# Patient Record
Sex: Female | Born: 1965
Health system: Southern US, Community
[De-identification: ages and names within clinical notes are randomized; demographics above are authoritative.]

## PROBLEM LIST (undated history)

## (undated) ENCOUNTER — Emergency Department (HOSPITAL_BASED_OUTPATIENT_CLINIC_OR_DEPARTMENT_OTHER): Admission: EM | Payer: BC Managed Care – PPO

## (undated) DIAGNOSIS — M5030 Other cervical disc degeneration, unspecified cervical region: Secondary | ICD-10-CM

## (undated) DIAGNOSIS — K219 Gastro-esophageal reflux disease without esophagitis: Secondary | ICD-10-CM

## (undated) DIAGNOSIS — E782 Mixed hyperlipidemia: Secondary | ICD-10-CM

## (undated) DIAGNOSIS — G43909 Migraine, unspecified, not intractable, without status migrainosus: Secondary | ICD-10-CM

## (undated) DIAGNOSIS — F419 Anxiety disorder, unspecified: Secondary | ICD-10-CM

## (undated) DIAGNOSIS — K449 Diaphragmatic hernia without obstruction or gangrene: Secondary | ICD-10-CM

## (undated) DIAGNOSIS — N183 Chronic kidney disease, stage 3 (moderate): Secondary | ICD-10-CM

## (undated) DIAGNOSIS — N289 Disorder of kidney and ureter, unspecified: Secondary | ICD-10-CM

## (undated) DIAGNOSIS — K589 Irritable bowel syndrome without diarrhea: Secondary | ICD-10-CM

## (undated) DIAGNOSIS — I10 Essential (primary) hypertension: Secondary | ICD-10-CM

## (undated) DIAGNOSIS — K2270 Barrett's esophagus without dysplasia: Secondary | ICD-10-CM

## (undated) DIAGNOSIS — R5383 Other fatigue: Secondary | ICD-10-CM

## (undated) DIAGNOSIS — Z79899 Other long term (current) drug therapy: Secondary | ICD-10-CM

## (undated) HISTORY — DX: Gastro-esophageal reflux disease without esophagitis: K21.9

## (undated) HISTORY — DX: Mixed hyperlipidemia: E78.2

## (undated) HISTORY — PX: GASTRECTOMY: SHX58

## (undated) HISTORY — PX: SHOULDER SURGERY: SHX246

## (undated) HISTORY — DX: Chronic kidney disease, stage 3 (moderate): N18.3

## (undated) HISTORY — DX: Diaphragmatic hernia without obstruction or gangrene: K44.9

## (undated) HISTORY — DX: Other cervical disc degeneration, unspecified cervical region: M50.30

## (undated) HISTORY — PX: ESOPHAGOGASTRIC FUNDOPLASTY: SUR458

## (undated) HISTORY — DX: Barrett's esophagus without dysplasia: K22.70

## (undated) HISTORY — PX: HIP SURGERY: SHX245

## (undated) HISTORY — DX: Other fatigue: R53.83

## (undated) HISTORY — DX: Other long term (current) drug therapy: Z79.899

## (undated) HISTORY — PX: ABDOMINAL HYSTERECTOMY: SHX81

---

## 2007-06-11 ENCOUNTER — Encounter: Payer: Self-pay | Admitting: Internal Medicine

## 2007-06-20 ENCOUNTER — Encounter: Payer: Self-pay | Admitting: Internal Medicine

## 2007-09-14 ENCOUNTER — Encounter: Payer: Self-pay | Admitting: Internal Medicine

## 2007-09-16 ENCOUNTER — Emergency Department (HOSPITAL_COMMUNITY): Admission: EM | Admit: 2007-09-16 | Discharge: 2007-09-16 | Payer: Self-pay | Admitting: Emergency Medicine

## 2007-09-17 ENCOUNTER — Encounter: Payer: Self-pay | Admitting: Internal Medicine

## 2008-11-11 ENCOUNTER — Encounter: Payer: Self-pay | Admitting: Internal Medicine

## 2008-11-17 ENCOUNTER — Emergency Department (HOSPITAL_BASED_OUTPATIENT_CLINIC_OR_DEPARTMENT_OTHER): Admission: EM | Admit: 2008-11-17 | Discharge: 2008-11-17 | Payer: Self-pay | Admitting: Emergency Medicine

## 2008-11-17 LAB — CONVERTED CEMR LAB
BUN: 9 mg/dL
CO2: 28 meq/L
Chloride: 102 meq/L
Creatinine, Ser: 0.8 mg/dL
HCT: 41 %
Hemoglobin: 14 g/dL
Platelets: 172 10*3/uL
Potassium: 3.9 meq/L
Sodium: 139 meq/L
WBC: 4.5 10*3/uL

## 2008-11-18 ENCOUNTER — Ambulatory Visit: Payer: Self-pay | Admitting: Internal Medicine

## 2008-11-18 DIAGNOSIS — I10 Essential (primary) hypertension: Secondary | ICD-10-CM | POA: Insufficient documentation

## 2008-11-18 DIAGNOSIS — F411 Generalized anxiety disorder: Secondary | ICD-10-CM | POA: Insufficient documentation

## 2008-11-18 DIAGNOSIS — R0602 Shortness of breath: Secondary | ICD-10-CM | POA: Insufficient documentation

## 2008-11-18 DIAGNOSIS — F17200 Nicotine dependence, unspecified, uncomplicated: Secondary | ICD-10-CM

## 2008-11-18 LAB — CONVERTED CEMR LAB
Free T4: 0.7 ng/dL (ref 0.6–1.6)
Pro B Natriuretic peptide (BNP): 12 pg/mL (ref 0.0–100.0)
TSH: 2.39 microintl units/mL (ref 0.35–5.50)

## 2008-11-19 ENCOUNTER — Telehealth (INDEPENDENT_AMBULATORY_CARE_PROVIDER_SITE_OTHER): Payer: Self-pay | Admitting: *Deleted

## 2008-11-26 ENCOUNTER — Telehealth: Payer: Self-pay | Admitting: Internal Medicine

## 2008-12-01 ENCOUNTER — Ambulatory Visit: Payer: Self-pay

## 2008-12-04 ENCOUNTER — Telehealth: Payer: Self-pay | Admitting: Internal Medicine

## 2008-12-10 ENCOUNTER — Telehealth: Payer: Self-pay | Admitting: Internal Medicine

## 2008-12-10 ENCOUNTER — Ambulatory Visit: Payer: Self-pay | Admitting: Internal Medicine

## 2008-12-10 LAB — CONVERTED CEMR LAB
ALT: 15 units/L (ref 0–35)
AST: 21 units/L (ref 0–37)
Albumin: 3.8 g/dL (ref 3.5–5.2)
Alkaline Phosphatase: 34 units/L — ABNORMAL LOW (ref 39–117)
Bilirubin, Direct: 0.1 mg/dL (ref 0.0–0.3)
CRP, High Sensitivity: 1 (ref 0.00–5.00)
Cholesterol: 150 mg/dL (ref 0–200)
HDL: 40.7 mg/dL (ref >39.0)
LDL Cholesterol: 97 mg/dL (ref 0–99)
Total Bilirubin: 0.6 mg/dL (ref 0.3–1.2)
Total CHOL/HDL Ratio: 3.7
Total Protein: 6.8 g/dL (ref 6.0–8.3)
Triglycerides: 62 mg/dL (ref 0–149)
VLDL: 12 mg/dL (ref 0–40)

## 2008-12-13 ENCOUNTER — Emergency Department (HOSPITAL_BASED_OUTPATIENT_CLINIC_OR_DEPARTMENT_OTHER): Admission: EM | Admit: 2008-12-13 | Discharge: 2008-12-13 | Payer: Self-pay | Admitting: Emergency Medicine

## 2008-12-15 ENCOUNTER — Ambulatory Visit: Payer: Self-pay | Admitting: Internal Medicine

## 2008-12-15 DIAGNOSIS — K229 Disease of esophagus, unspecified: Secondary | ICD-10-CM | POA: Insufficient documentation

## 2009-01-06 ENCOUNTER — Ambulatory Visit: Payer: Self-pay | Admitting: Internal Medicine

## 2009-01-06 DIAGNOSIS — J309 Allergic rhinitis, unspecified: Secondary | ICD-10-CM | POA: Insufficient documentation

## 2009-01-06 DIAGNOSIS — R197 Diarrhea, unspecified: Secondary | ICD-10-CM

## 2009-01-07 ENCOUNTER — Emergency Department (HOSPITAL_BASED_OUTPATIENT_CLINIC_OR_DEPARTMENT_OTHER): Admission: EM | Admit: 2009-01-07 | Discharge: 2009-01-07 | Payer: Self-pay | Admitting: Emergency Medicine

## 2009-01-09 ENCOUNTER — Ambulatory Visit: Payer: Self-pay | Admitting: Internal Medicine

## 2009-01-09 ENCOUNTER — Emergency Department (HOSPITAL_BASED_OUTPATIENT_CLINIC_OR_DEPARTMENT_OTHER): Admission: EM | Admit: 2009-01-09 | Discharge: 2009-01-10 | Payer: Self-pay | Admitting: Emergency Medicine

## 2009-01-09 ENCOUNTER — Ambulatory Visit: Payer: Self-pay | Admitting: Diagnostic Radiology

## 2009-01-09 DIAGNOSIS — K219 Gastro-esophageal reflux disease without esophagitis: Secondary | ICD-10-CM

## 2009-01-26 ENCOUNTER — Ambulatory Visit: Payer: Self-pay | Admitting: Internal Medicine

## 2009-01-26 DIAGNOSIS — R519 Headache, unspecified: Secondary | ICD-10-CM | POA: Insufficient documentation

## 2009-01-26 DIAGNOSIS — R51 Headache: Secondary | ICD-10-CM

## 2009-01-27 ENCOUNTER — Encounter: Payer: Self-pay | Admitting: Internal Medicine

## 2009-01-27 LAB — CONVERTED CEMR LAB
BUN: 9 mg/dL (ref 6–23)
CO2: 25 meq/L (ref 19–32)
Calcium: 10 mg/dL (ref 8.4–10.5)
Chloride: 103 meq/L (ref 96–112)
Creatinine, Ser: 0.81 mg/dL (ref 0.40–1.20)
Glucose, Bld: 67 mg/dL — ABNORMAL LOW (ref 70–99)
Potassium: 3.8 meq/L (ref 3.5–5.3)
Sodium: 138 meq/L (ref 135–145)

## 2009-01-28 ENCOUNTER — Telehealth: Payer: Self-pay | Admitting: Internal Medicine

## 2009-03-10 ENCOUNTER — Encounter: Payer: Self-pay | Admitting: Internal Medicine

## 2009-05-27 ENCOUNTER — Emergency Department (HOSPITAL_BASED_OUTPATIENT_CLINIC_OR_DEPARTMENT_OTHER): Admission: EM | Admit: 2009-05-27 | Discharge: 2009-05-27 | Payer: Self-pay | Admitting: Emergency Medicine

## 2009-05-27 ENCOUNTER — Ambulatory Visit: Payer: Self-pay | Admitting: Diagnostic Radiology

## 2009-08-04 ENCOUNTER — Encounter: Payer: Self-pay | Admitting: Internal Medicine

## 2009-08-13 ENCOUNTER — Encounter: Payer: Self-pay | Admitting: Internal Medicine

## 2009-09-10 ENCOUNTER — Emergency Department (HOSPITAL_BASED_OUTPATIENT_CLINIC_OR_DEPARTMENT_OTHER): Admission: EM | Admit: 2009-09-10 | Discharge: 2009-09-10 | Payer: Self-pay | Admitting: Emergency Medicine

## 2009-11-09 ENCOUNTER — Observation Stay (HOSPITAL_COMMUNITY): Admission: EM | Admit: 2009-11-09 | Discharge: 2009-11-10 | Payer: Self-pay | Admitting: Internal Medicine

## 2009-11-11 ENCOUNTER — Ambulatory Visit: Payer: Self-pay | Admitting: Diagnostic Radiology

## 2009-11-11 ENCOUNTER — Emergency Department (HOSPITAL_BASED_OUTPATIENT_CLINIC_OR_DEPARTMENT_OTHER): Admission: EM | Admit: 2009-11-11 | Discharge: 2009-11-11 | Payer: Self-pay | Admitting: Emergency Medicine

## 2010-07-27 ENCOUNTER — Ambulatory Visit (HOSPITAL_BASED_OUTPATIENT_CLINIC_OR_DEPARTMENT_OTHER): Admission: RE | Admit: 2010-07-27 | Discharge: 2010-07-27 | Payer: Self-pay | Admitting: Internal Medicine

## 2010-07-27 ENCOUNTER — Ambulatory Visit: Payer: Self-pay | Admitting: Diagnostic Radiology

## 2010-08-15 ENCOUNTER — Emergency Department (HOSPITAL_BASED_OUTPATIENT_CLINIC_OR_DEPARTMENT_OTHER): Admission: EM | Admit: 2010-08-15 | Discharge: 2010-08-15 | Payer: Self-pay | Admitting: Emergency Medicine

## 2010-11-07 ENCOUNTER — Encounter: Payer: Self-pay | Admitting: Internal Medicine

## 2010-12-04 ENCOUNTER — Emergency Department (INDEPENDENT_AMBULATORY_CARE_PROVIDER_SITE_OTHER): Payer: Managed Care, Other (non HMO)

## 2010-12-04 ENCOUNTER — Emergency Department (HOSPITAL_BASED_OUTPATIENT_CLINIC_OR_DEPARTMENT_OTHER)
Admission: EM | Admit: 2010-12-04 | Discharge: 2010-12-05 | Disposition: A | Discharge status: Home / Self Care | Payer: Managed Care, Other (non HMO) | Attending: Emergency Medicine | Admitting: Emergency Medicine

## 2010-12-04 DIAGNOSIS — M545 Low back pain: Secondary | ICD-10-CM

## 2010-12-04 DIAGNOSIS — R110 Nausea: Secondary | ICD-10-CM

## 2010-12-04 DIAGNOSIS — R1013 Epigastric pain: Secondary | ICD-10-CM | POA: Insufficient documentation

## 2010-12-04 DIAGNOSIS — M549 Dorsalgia, unspecified: Secondary | ICD-10-CM | POA: Insufficient documentation

## 2010-12-04 DIAGNOSIS — K219 Gastro-esophageal reflux disease without esophagitis: Secondary | ICD-10-CM | POA: Insufficient documentation

## 2010-12-04 DIAGNOSIS — I10 Essential (primary) hypertension: Secondary | ICD-10-CM | POA: Insufficient documentation

## 2010-12-04 DIAGNOSIS — K220 Achalasia of cardia: Secondary | ICD-10-CM | POA: Insufficient documentation

## 2010-12-04 DIAGNOSIS — F17200 Nicotine dependence, unspecified, uncomplicated: Secondary | ICD-10-CM | POA: Insufficient documentation

## 2010-12-04 LAB — LIPASE, BLOOD: Lipase: 166 U/L (ref 23–300)

## 2010-12-04 LAB — DIFFERENTIAL
Basophils Absolute: 0 10*3/uL (ref 0.0–0.1)
Basophils Relative: 0 % (ref 0–1)
Eosinophils Relative: 1 % (ref 0–5)
Lymphocytes Relative: 51 % — ABNORMAL HIGH (ref 12–46)
Monocytes Relative: 6 % (ref 3–12)
Neutro Abs: 2.2 10*3/uL (ref 1.7–7.7)
Neutrophils Relative %: 42 % — ABNORMAL LOW (ref 43–77)

## 2010-12-04 LAB — CBC
Hemoglobin: 13.5 g/dL (ref 12.0–15.0)
MCH: 30.5 pg (ref 26.0–34.0)
MCHC: 34.5 g/dL (ref 30.0–36.0)
MCV: 88.5 fL (ref 78.0–100.0)
Platelets: 179 10*3/uL (ref 150–400)
RBC: 4.42 MIL/uL (ref 3.87–5.11)
RDW: 12.2 % (ref 11.5–15.5)

## 2010-12-04 LAB — URINE MICROSCOPIC-ADD ON

## 2010-12-04 LAB — COMPREHENSIVE METABOLIC PANEL
ALT: 19 U/L (ref 0–35)
AST: 22 U/L (ref 0–37)
Alkaline Phosphatase: 47 U/L (ref 39–117)
BUN: 11 mg/dL (ref 6–23)
CO2: 23 mEq/L (ref 19–32)
Calcium: 9.8 mg/dL (ref 8.4–10.5)
Chloride: 108 mEq/L (ref 96–112)
Creatinine, Ser: 0.7 mg/dL (ref 0.4–1.2)
GFR calc Af Amer: >60 mL/min (ref >60)
GFR calc non Af Amer: >60 mL/min (ref >60)
Glucose, Bld: 87 mg/dL (ref 70–99)
Potassium: 4 mEq/L (ref 3.5–5.1)
Sodium: 142 mEq/L (ref 135–145)
Total Protein: 8 g/dL (ref 6.0–8.3)

## 2010-12-04 LAB — URINALYSIS, ROUTINE W REFLEX MICROSCOPIC
Bilirubin Urine: NEGATIVE
Leukocytes, UA: NEGATIVE
Specific Gravity, Urine: 1.024 (ref 1.005–1.030)
Urine Glucose, Fasting: NEGATIVE mg/dL
Urobilinogen, UA: 1 mg/dL (ref 0.0–1.0)
pH: 6 (ref 5.0–8.0)

## 2010-12-04 LAB — PREGNANCY, URINE: Preg Test, Ur: NEGATIVE

## 2010-12-04 MED ORDER — IOHEXOL 300 MG/ML  SOLN
100.0000 mL | Freq: Once | INTRAMUSCULAR | Status: AC | PRN
  Administered 2010-12-04: 100 mL via INTRAVENOUS

## 2011-01-03 LAB — CARDIAC PANEL(CRET KIN+CKTOT+MB+TROPI)
CK, MB: 0.6 ng/mL (ref 0.3–4.0)
CK, MB: 0.8 ng/mL (ref 0.3–4.0)
Relative Index: INVALID (ref 0.0–2.5)
Relative Index: INVALID (ref 0.0–2.5)
Total CK: 57 U/L (ref 7–177)
Total CK: 69 U/L (ref 7–177)
Troponin I: 0.01 ng/mL (ref 0.00–0.06)
Troponin I: <0.01 ng/mL (ref 0.00–0.06)

## 2011-01-03 LAB — POCT I-STAT, CHEM 8
BUN: 9 mg/dL (ref 6–23)
Calcium, Ion: 1.28 mmol/L (ref 1.12–1.32)
Chloride: 109 mEq/L (ref 96–112)
Creatinine, Ser: 0.7 mg/dL (ref 0.4–1.2)
Glucose, Bld: 105 mg/dL — ABNORMAL HIGH (ref 70–99)
HCT: 36 % (ref 36.0–46.0)
Hemoglobin: 12.2 g/dL (ref 12.0–15.0)
Potassium: 3.7 mEq/L (ref 3.5–5.1)
Sodium: 138 mEq/L (ref 135–145)
TCO2: 19 mmol/L (ref 0–100)

## 2011-01-03 LAB — LIPID PANEL
Cholesterol: 152 mg/dL (ref 0–200)
HDL: 50 mg/dL (ref >39)
LDL Cholesterol: 83 mg/dL (ref 0–99)
Total CHOL/HDL Ratio: 3 RATIO
Triglycerides: 97 mg/dL (ref <150)
VLDL: 19 mg/dL (ref 0–40)

## 2011-01-03 LAB — CBC
Platelets: 156 10*3/uL (ref 150–400)
RDW: 12.8 % (ref 11.5–15.5)

## 2011-01-03 LAB — PROTIME-INR: INR: 1.13 (ref 0.00–1.49)

## 2011-01-03 LAB — COMPREHENSIVE METABOLIC PANEL
ALT: 15 U/L (ref 0–35)
Albumin: 3.4 g/dL — ABNORMAL LOW (ref 3.5–5.2)
Alkaline Phosphatase: 35 U/L — ABNORMAL LOW (ref 39–117)
BUN: 9 mg/dL (ref 6–23)
Potassium: 3.7 mEq/L (ref 3.5–5.1)
Sodium: 134 mEq/L — ABNORMAL LOW (ref 135–145)
Total Protein: 6.6 g/dL (ref 6.0–8.3)

## 2011-01-19 LAB — CBC
MCV: 91.8 fL (ref 78.0–100.0)
RBC: 4.26 MIL/uL (ref 3.87–5.11)
WBC: 4.7 10*3/uL (ref 4.0–10.5)

## 2011-01-19 LAB — URINALYSIS, ROUTINE W REFLEX MICROSCOPIC
Bilirubin Urine: NEGATIVE
Glucose, UA: NEGATIVE mg/dL
Ketones, ur: NEGATIVE mg/dL
Nitrite: NEGATIVE
Protein, ur: NEGATIVE mg/dL
Urobilinogen, UA: 0.2 mg/dL (ref 0.0–1.0)

## 2011-01-19 LAB — LIPASE, BLOOD: Lipase: 153 U/L (ref 23–300)

## 2011-01-19 LAB — COMPREHENSIVE METABOLIC PANEL
ALT: 18 U/L (ref 0–35)
AST: 26 U/L (ref 0–37)
Alkaline Phosphatase: 54 U/L (ref 39–117)
CO2: 23 mEq/L (ref 19–32)
Chloride: 108 mEq/L (ref 96–112)
Creatinine, Ser: 0.8 mg/dL (ref 0.4–1.2)
GFR calc Af Amer: >60 mL/min (ref >60)
GFR calc non Af Amer: >60 mL/min (ref >60)
Potassium: 4 mEq/L (ref 3.5–5.1)
Total Bilirubin: 0.4 mg/dL (ref 0.3–1.2)

## 2011-01-19 LAB — DIFFERENTIAL
Basophils Absolute: 0.1 10*3/uL (ref 0.0–0.1)
Basophils Relative: 2 % — ABNORMAL HIGH (ref 0–1)
Eosinophils Absolute: 0.1 10*3/uL (ref 0.0–0.7)
Eosinophils Relative: 1 % (ref 0–5)
Lymphocytes Relative: 34 % (ref 12–46)

## 2011-01-20 ENCOUNTER — Emergency Department (INDEPENDENT_AMBULATORY_CARE_PROVIDER_SITE_OTHER): Payer: Managed Care, Other (non HMO)

## 2011-01-20 ENCOUNTER — Emergency Department (HOSPITAL_BASED_OUTPATIENT_CLINIC_OR_DEPARTMENT_OTHER)
Admission: EM | Admit: 2011-01-20 | Discharge: 2011-01-20 | Disposition: A | Discharge status: Home / Self Care | Payer: Managed Care, Other (non HMO) | Attending: Emergency Medicine | Admitting: Emergency Medicine

## 2011-01-20 DIAGNOSIS — R51 Headache: Secondary | ICD-10-CM

## 2011-01-20 DIAGNOSIS — J329 Chronic sinusitis, unspecified: Secondary | ICD-10-CM | POA: Insufficient documentation

## 2011-01-20 DIAGNOSIS — Z79899 Other long term (current) drug therapy: Secondary | ICD-10-CM | POA: Insufficient documentation

## 2011-01-20 DIAGNOSIS — K219 Gastro-esophageal reflux disease without esophagitis: Secondary | ICD-10-CM | POA: Insufficient documentation

## 2011-01-20 DIAGNOSIS — I10 Essential (primary) hypertension: Secondary | ICD-10-CM | POA: Insufficient documentation

## 2011-01-20 DIAGNOSIS — F17200 Nicotine dependence, unspecified, uncomplicated: Secondary | ICD-10-CM | POA: Insufficient documentation

## 2011-01-20 DIAGNOSIS — J3489 Other specified disorders of nose and nasal sinuses: Secondary | ICD-10-CM

## 2011-01-20 LAB — CBC
HCT: 40.3 % (ref 36.0–46.0)
Hemoglobin: 13.8 g/dL (ref 12.0–15.0)
MCHC: 34.2 g/dL (ref 30.0–36.0)
RBC: 4.56 MIL/uL (ref 3.87–5.11)

## 2011-01-20 LAB — DIFFERENTIAL
Basophils Absolute: 0 10*3/uL (ref 0.0–0.1)
Lymphocytes Relative: 13 % (ref 12–46)
Monocytes Absolute: 0.2 10*3/uL (ref 0.1–1.0)
Monocytes Relative: 2 % — ABNORMAL LOW (ref 3–12)
Neutro Abs: 6.5 10*3/uL (ref 1.7–7.7)
Neutrophils Relative %: 85 % — ABNORMAL HIGH (ref 43–77)

## 2011-01-20 LAB — BASIC METABOLIC PANEL
CO2: 24 mEq/L (ref 19–32)
Calcium: 9.9 mg/dL (ref 8.4–10.5)
Chloride: 104 mEq/L (ref 96–112)
GFR calc Af Amer: >60 mL/min (ref >60)
Glucose, Bld: 121 mg/dL — ABNORMAL HIGH (ref 70–99)
Sodium: 145 mEq/L (ref 135–145)

## 2011-01-20 LAB — URINALYSIS, ROUTINE W REFLEX MICROSCOPIC
Bilirubin Urine: NEGATIVE
Hgb urine dipstick: NEGATIVE
Nitrite: NEGATIVE
Specific Gravity, Urine: 1.016 (ref 1.005–1.030)
pH: 6 (ref 5.0–8.0)

## 2011-01-22 LAB — BASIC METABOLIC PANEL
BUN: 16 mg/dL (ref 6–23)
CO2: 27 mEq/L (ref 19–32)
Chloride: 104 mEq/L (ref 96–112)
GFR calc non Af Amer: >60 mL/min (ref >60)
Glucose, Bld: 97 mg/dL (ref 70–99)
Potassium: 4.2 mEq/L (ref 3.5–5.1)
Sodium: 140 mEq/L (ref 135–145)

## 2011-01-22 LAB — CBC
HCT: 37 % (ref 36.0–46.0)
Hemoglobin: 12.8 g/dL (ref 12.0–15.0)
MCHC: 34.5 g/dL (ref 30.0–36.0)
MCV: 92.3 fL (ref 78.0–100.0)
Platelets: 211 10*3/uL (ref 150–400)
RDW: 13 % (ref 11.5–15.5)

## 2011-01-22 LAB — DIFFERENTIAL
Basophils Absolute: 0.1 10*3/uL (ref 0.0–0.1)
Basophils Relative: 2 % — ABNORMAL HIGH (ref 0–1)
Eosinophils Absolute: 0.1 10*3/uL (ref 0.0–0.7)
Eosinophils Relative: 2 % (ref 0–5)
Lymphocytes Relative: 37 % (ref 12–46)
Monocytes Absolute: 0.4 10*3/uL (ref 0.1–1.0)

## 2011-01-22 LAB — URINALYSIS, ROUTINE W REFLEX MICROSCOPIC
Bilirubin Urine: NEGATIVE
Ketones, ur: NEGATIVE mg/dL
Nitrite: NEGATIVE
Protein, ur: NEGATIVE mg/dL
pH: 6.5 (ref 5.0–8.0)

## 2011-01-27 LAB — POCT CARDIAC MARKERS: Troponin i, poc: <0.05 ng/mL (ref 0.00–0.09)

## 2011-01-27 LAB — CBC
Hemoglobin: 13.5 g/dL (ref 12.0–15.0)
MCHC: 33.9 g/dL (ref 30.0–36.0)
Platelets: 188 10*3/uL (ref 150–400)
RDW: 12.4 % (ref 11.5–15.5)

## 2011-01-27 LAB — URINALYSIS, ROUTINE W REFLEX MICROSCOPIC
Bilirubin Urine: NEGATIVE
Hgb urine dipstick: NEGATIVE
Ketones, ur: NEGATIVE mg/dL
Nitrite: NEGATIVE
Urobilinogen, UA: 1 mg/dL (ref 0.0–1.0)
pH: 5.5 (ref 5.0–8.0)

## 2011-01-27 LAB — COMPREHENSIVE METABOLIC PANEL
Albumin: 4.5 g/dL (ref 3.5–5.2)
BUN: 11 mg/dL (ref 6–23)
Calcium: 9.6 mg/dL (ref 8.4–10.5)
Glucose, Bld: 90 mg/dL (ref 70–99)
Sodium: 142 mEq/L (ref 135–145)
Total Protein: 8 g/dL (ref 6.0–8.3)

## 2011-01-27 LAB — DIFFERENTIAL
Lymphs Abs: 1.7 10*3/uL (ref 0.7–4.0)
Monocytes Absolute: 0.3 10*3/uL (ref 0.1–1.0)
Monocytes Relative: 6 % (ref 3–12)
Neutro Abs: 2.1 10*3/uL (ref 1.7–7.7)
Neutrophils Relative %: 45 % (ref 43–77)

## 2011-02-01 LAB — DIFFERENTIAL
Eosinophils Absolute: 0.1 10*3/uL (ref 0.0–0.7)
Eosinophils Relative: 2 % (ref 0–5)
Lymphs Abs: 2.2 10*3/uL (ref 0.7–4.0)
Monocytes Absolute: 0.3 10*3/uL (ref 0.1–1.0)
Monocytes Relative: 6 % (ref 3–12)

## 2011-02-01 LAB — GLUCOSE, CAPILLARY: Glucose-Capillary: 94 mg/dL (ref 70–99)

## 2011-02-01 LAB — CBC
HCT: 41 % (ref 36.0–46.0)
Hemoglobin: 14 g/dL (ref 12.0–15.0)
MCV: 93.5 fL (ref 78.0–100.0)
Platelets: 172 10*3/uL (ref 150–400)
RBC: 4.39 MIL/uL (ref 3.87–5.11)
WBC: 4.5 10*3/uL (ref 4.0–10.5)

## 2011-02-01 LAB — BASIC METABOLIC PANEL
BUN: 9 mg/dL (ref 6–23)
Chloride: 102 mEq/L (ref 96–112)
Potassium: 3.9 mEq/L (ref 3.5–5.1)

## 2011-02-11 ENCOUNTER — Emergency Department (HOSPITAL_BASED_OUTPATIENT_CLINIC_OR_DEPARTMENT_OTHER)
Admission: EM | Admit: 2011-02-11 | Discharge: 2011-02-12 | Disposition: A | Discharge status: Home / Self Care | Payer: Managed Care, Other (non HMO) | Attending: Emergency Medicine | Admitting: Emergency Medicine

## 2011-02-11 DIAGNOSIS — I10 Essential (primary) hypertension: Secondary | ICD-10-CM | POA: Insufficient documentation

## 2011-02-11 DIAGNOSIS — K219 Gastro-esophageal reflux disease without esophagitis: Secondary | ICD-10-CM | POA: Insufficient documentation

## 2011-02-11 DIAGNOSIS — F17200 Nicotine dependence, unspecified, uncomplicated: Secondary | ICD-10-CM | POA: Insufficient documentation

## 2011-02-11 DIAGNOSIS — Z79899 Other long term (current) drug therapy: Secondary | ICD-10-CM | POA: Insufficient documentation

## 2011-02-11 DIAGNOSIS — G43801 Other migraine, not intractable, with status migrainosus: Secondary | ICD-10-CM | POA: Insufficient documentation

## 2011-06-17 ENCOUNTER — Emergency Department (INDEPENDENT_AMBULATORY_CARE_PROVIDER_SITE_OTHER): Payer: Managed Care, Other (non HMO)

## 2011-06-17 ENCOUNTER — Encounter: Payer: Self-pay | Admitting: Family Medicine

## 2011-06-17 ENCOUNTER — Emergency Department (HOSPITAL_BASED_OUTPATIENT_CLINIC_OR_DEPARTMENT_OTHER)
Admission: EM | Admit: 2011-06-17 | Discharge: 2011-06-17 | Disposition: A | Discharge status: Home / Self Care | Payer: Managed Care, Other (non HMO) | Attending: Emergency Medicine | Admitting: Emergency Medicine

## 2011-06-17 DIAGNOSIS — R110 Nausea: Secondary | ICD-10-CM

## 2011-06-17 DIAGNOSIS — G43909 Migraine, unspecified, not intractable, without status migrainosus: Secondary | ICD-10-CM

## 2011-06-17 DIAGNOSIS — I10 Essential (primary) hypertension: Secondary | ICD-10-CM | POA: Insufficient documentation

## 2011-06-17 DIAGNOSIS — F17200 Nicotine dependence, unspecified, uncomplicated: Secondary | ICD-10-CM | POA: Insufficient documentation

## 2011-06-17 DIAGNOSIS — R51 Headache: Secondary | ICD-10-CM

## 2011-06-17 HISTORY — DX: Migraine, unspecified, not intractable, without status migrainosus: G43.909

## 2011-06-17 HISTORY — DX: Anxiety disorder, unspecified: F41.9

## 2011-06-17 HISTORY — DX: Essential (primary) hypertension: I10

## 2011-06-17 LAB — CSF CELL COUNT WITH DIFFERENTIAL: Tube #: 1

## 2011-06-17 MED ORDER — SODIUM CHLORIDE 0.9 % IV SOLN
Freq: Once | INTRAVENOUS | Status: AC
  Administered 2011-06-17: 09:00:00 via INTRAVENOUS

## 2011-06-17 MED ORDER — DIPHENHYDRAMINE HCL 50 MG/ML IJ SOLN
25.0000 mg | Freq: Once | INTRAMUSCULAR | Status: AC
  Administered 2011-06-17: 25 mg via INTRAVENOUS
  Filled 2011-06-17: qty 1

## 2011-06-17 MED ORDER — LIDOCAINE HCL 2 % IJ SOLN
INTRAMUSCULAR | Status: AC
  Filled 2011-06-17: qty 1

## 2011-06-17 MED ORDER — LIDOCAINE HCL (PF) 1 % IJ SOLN
INTRAMUSCULAR | Status: AC
  Administered 2011-06-17: 10 mL
  Filled 2011-06-17: qty 5

## 2011-06-17 MED ORDER — KETOROLAC TROMETHAMINE 30 MG/ML IJ SOLN
30.0000 mg | Freq: Once | INTRAMUSCULAR | Status: AC
  Administered 2011-06-17: 30 mg via INTRAVENOUS
  Filled 2011-06-17: qty 1

## 2011-06-17 MED ORDER — DEXAMETHASONE SODIUM PHOSPHATE 10 MG/ML IJ SOLN
10.0000 mg | Freq: Once | INTRAMUSCULAR | Status: AC
  Administered 2011-06-17: 10 mg via INTRAVENOUS
  Filled 2011-06-17: qty 1

## 2011-06-17 MED ORDER — LIDOCAINE HCL (PF) 1 % IJ SOLN
10.0000 mL | Freq: Once | INTRAMUSCULAR | Status: AC
  Administered 2011-06-17: 10 mL

## 2011-06-17 NOTE — ED Notes (Signed)
Pt sts she would prefer to have a LP and EDP informed pt regarding risks. Pt signed written procedure consent.

## 2011-06-17 NOTE — ED Provider Notes (Signed)
History     CSN: [id number] Arrival date & time: 06/17/2011  8:08 AM  Chief Complaint  Patient presents with  . Migraine   HPI Comments: Pt initially said that this feels like typical past migraines.  Now says that last night, felt different, now feels the same.  Is concerned because her brother recently died of brain tumor.  I said that her pain doesn't sound consistent with that, but pt is concerned that this headache is something different than migraine, so will check head CT and LP, discussed with pt  Patient is a 45 y.o. female presenting with migraine. The history is provided by the patient.  Migraine This is a new problem. The current episode started 12 to 24 hours ago. The problem occurs constantly. The problem has been gradually worsening. Associated symptoms include headaches. Pertinent negatives include no chest pain, no abdominal pain and no shortness of breath. Exacerbated by: lights, noise. The symptoms are relieved by nothing. She has tried nothing for the symptoms.    Past Medical History  Diagnosis Date  . Hypertension   . Migraines   . Anxiety     Past Surgical History  Procedure Date  . Abdominal hysterectomy   . Gastrectomy     No family history on file.  History  Substance Use Topics  . Smoking status: Current Everyday Smoker -- 0.5 packs/day    Types: Cigarettes  . Smokeless tobacco: Not on file  . Alcohol Use: Yes    OB History    Grav Para Term Preterm Abortions TAB SAB Ect Mult Living                  Review of Systems  Constitutional: Negative for fever, chills, diaphoresis and fatigue.  HENT: Negative for congestion, rhinorrhea, sneezing and neck stiffness.   Eyes: Negative.   Respiratory: Negative for cough, chest tightness and shortness of breath.   Cardiovascular: Negative for chest pain and leg swelling.  Gastrointestinal: Negative for nausea, vomiting, abdominal pain, diarrhea and blood in stool.  Genitourinary: Negative for  frequency, hematuria, flank pain and difficulty urinating.  Musculoskeletal: Negative for back pain and arthralgias.  Skin: Negative for rash.  Neurological: Positive for headaches. Negative for dizziness, facial asymmetry, speech difficulty, weakness and numbness.    Physical Exam  BP 150/101  Pulse 100  Temp(Src) 97.7 F (36.5 C) (Oral)  Resp 18  Ht 5\' 2"  (1.575 m)  Wt 125 lb (56.7 kg)  BMI 22.86 kg/m2  SpO2 100%  Physical Exam  Constitutional: She is oriented to person, place, and time. She appears well-developed and well-nourished.  HENT:  Head: Normocephalic and atraumatic.  Eyes: Pupils are equal, round, and reactive to light.  Neck: Normal range of motion. Neck supple.  Cardiovascular: Normal rate, regular rhythm and normal heart sounds.   Pulmonary/Chest: Effort normal and breath sounds normal. No respiratory distress. She has no wheezes. She has no rales. She exhibits no tenderness.  Abdominal: Soft. Bowel sounds are normal. There is no tenderness. There is no rebound and no guarding.  Musculoskeletal: Normal range of motion. She exhibits no edema.  Lymphadenopathy:    She has no cervical adenopathy.  Neurological: She is alert and oriented to person, place, and time. She has normal strength. No cranial nerve deficit or sensory deficit. Coordination normal. GCS eye subscore is 4. GCS verbal subscore is 5. GCS motor subscore is 6.  Skin: Skin is warm and dry. No rash noted.  Psychiatric: She has a normal  mood and affect.    ED Course  Procedures  MDM No evidence of SAH, meningitis, likely migraine  Results for orders placed during the hospital encounter of 06/17/11  CSF CELL COUNT WITH DIFFERENTIAL      Component Value Range   Tube # 1     Color, CSF COLORLESS  COLORLESS    Appearance, CSF CLEAR  CLEAR    Supernatant NOT INDICATED     RBC Count 10 (*) 0 (/cu mm)   WBC, CSF 2  0 - 5 (/cu mm)   Segmented Neutrophils-CSF PENDING  0 - 6 (%)   Lymphs, CSF PENDING   40 - 80 (%)   Monocyte-Macrophage-Spinal Fluid PENDING  15 - 45 (%)   Eosinophils, CSF PENDING  0 - 1 (%)   Other Cells, CSF PENDING    CSF CELL COUNT WITH DIFFERENTIAL      Component Value Range   Tube # 4     Color, CSF COLORLESS  COLORLESS    Appearance, CSF CLEAR  CLEAR    Supernatant NOT INDICATED     RBC Count 1 (*) 0 (/cu mm)   WBC, CSF 2  0 - 5 (/cu mm)   Segmented Neutrophils-CSF PENDING  0 - 6 (%)   Lymphs, CSF PENDING  40 - 80 (%)   Monocyte-Macrophage-Spinal Fluid PENDING  15 - 45 (%)   Eosinophils, CSF PENDING  0 - 1 (%)   Other Cells, CSF PENDING    CSF CULTURE      Component Value Range   Specimen Description CSF     Special Requests NONE     Gram Stain       Value: CYTOSPIN WBC PRESENT, PREDOMINANTLY MONONUCLEAR     NO ORGANISMS SEEN   Culture PENDING     Report Status PENDING    GLUCOSE, CSF      Component Value Range   Glucose, CSF 60  43 - 76 (mg/dL)  PROTEIN, CSF      Component Value Range   Total  Protein, CSF 31  15 - 45 (mg/dL)   Ct Head Wo Contrast  06/17/2011  *RADIOLOGY REPORT*  Clinical Data:  Frontal headache with nausea and photophobia.  CT HEAD WITHOUT CONTRAST  Technique:  Contiguous axial images were obtained from the base of the skull through the vertex without contrast  Comparison:  01/20/2011  Findings:  The brain has a normal appearance without evidence for hemorrhage, acute infarction, hydrocephalus, or mass lesion.  There is no extra axial fluid collection.  The skull and paranasal sinuses are normal.  IMPRESSION: Normal CT of the head without contrast.  Original Report Authenticated By: Reola Calkins, M.D.      Rolan Bucco, MD 06/17/11 [phone]

## 2011-06-17 NOTE — ED Notes (Signed)
MD at bedside. 

## 2011-06-17 NOTE — ED Notes (Signed)
Pt c/o migraine since last night. Pt sts headache has improved "some" but n/v persists. Pt denies photophobia. Pt sts she has migraine medication but did not take it last night.

## 2011-06-17 NOTE — ED Notes (Signed)
IV removed from left [facility] with cath intact. Dressing applied.

## 2011-06-17 NOTE — ED Notes (Signed)
Pt acuity changed from 4 to 3 based on treatment plan.

## 2011-06-17 NOTE — ED Notes (Signed)
Pt sts she did not take her BP med this morning.

## 2011-06-20 ENCOUNTER — Encounter (HOSPITAL_BASED_OUTPATIENT_CLINIC_OR_DEPARTMENT_OTHER): Payer: Self-pay | Admitting: *Deleted

## 2011-06-20 ENCOUNTER — Emergency Department (HOSPITAL_BASED_OUTPATIENT_CLINIC_OR_DEPARTMENT_OTHER)
Admission: EM | Admit: 2011-06-20 | Discharge: 2011-06-20 | Disposition: A | Discharge status: Home / Self Care | Payer: Managed Care, Other (non HMO) | Attending: Emergency Medicine | Admitting: Emergency Medicine

## 2011-06-20 ENCOUNTER — Other Ambulatory Visit: Payer: Self-pay

## 2011-06-20 DIAGNOSIS — F17200 Nicotine dependence, unspecified, uncomplicated: Secondary | ICD-10-CM | POA: Insufficient documentation

## 2011-06-20 DIAGNOSIS — I10 Essential (primary) hypertension: Secondary | ICD-10-CM | POA: Insufficient documentation

## 2011-06-20 DIAGNOSIS — R0789 Other chest pain: Secondary | ICD-10-CM | POA: Insufficient documentation

## 2011-06-20 LAB — CSF CULTURE W GRAM STAIN: Culture: NO GROWTH

## 2011-06-20 LAB — CBC
HCT: 36.7 % (ref 36.0–46.0)
Hemoglobin: 12.4 g/dL (ref 12.0–15.0)
MCV: 90.4 fL (ref 78.0–100.0)
RDW: 12.4 % (ref 11.5–15.5)
WBC: 5.6 10*3/uL (ref 4.0–10.5)

## 2011-06-20 LAB — COMPREHENSIVE METABOLIC PANEL
AST: 18 U/L (ref 0–37)
BUN: 12 mg/dL (ref 6–23)
CO2: 24 mEq/L (ref 19–32)
Calcium: 9.3 mg/dL (ref 8.4–10.5)
Chloride: 101 mEq/L (ref 96–112)
Creatinine, Ser: 0.7 mg/dL (ref 0.50–1.10)
GFR calc Af Amer: >60 mL/min (ref >60)
GFR calc non Af Amer: >60 mL/min (ref >60)
Glucose, Bld: 88 mg/dL (ref 70–99)
Total Bilirubin: 0.2 mg/dL — ABNORMAL LOW (ref 0.3–1.2)

## 2011-06-20 LAB — LIPASE, BLOOD: Lipase: 37 U/L (ref 11–59)

## 2011-06-20 LAB — DIFFERENTIAL
Basophils Absolute: 0 10*3/uL (ref 0.0–0.1)
Eosinophils Relative: 4 % (ref 0–5)
Lymphocytes Relative: 44 % (ref 12–46)
Monocytes Absolute: 0.3 10*3/uL (ref 0.1–1.0)
Monocytes Relative: 5 % (ref 3–12)

## 2011-06-20 LAB — CK TOTAL AND CKMB (NOT AT ARMC)
CK, MB: 1.7 ng/mL (ref 0.3–4.0)
Total CK: 83 U/L (ref 7–177)

## 2011-06-20 MED ORDER — KETOROLAC TROMETHAMINE 60 MG/2ML IM SOLN
60.0000 mg | Freq: Once | INTRAMUSCULAR | Status: AC
  Administered 2011-06-20: 60 mg via INTRAMUSCULAR
  Filled 2011-06-20: qty 2

## 2011-06-20 NOTE — ED Notes (Signed)
States she was seen here 3 days ago with headache, facial tingling and had an LP. This am had epigastric pain. No relief with antiacids.

## 2011-06-20 NOTE — ED Provider Notes (Addendum)
History     CSN: [id number] Arrival date & time: 06/20/2011  6:55 PM  Chief Complaint  Patient presents with  . Chest Pain   HPI Comments: Started this morning with discomfort in chest.  Had stress test several years ago which was okay.  Denies recent exertional symptoms.    Patient is a 45 y.o. female presenting with chest pain. The history is provided by the patient.  Chest Pain The chest pain began 6 - 12 hours ago. Chest pain occurs constantly. The chest pain is unchanged. The quality of the pain is described as sharp. The pain does not radiate. Chest pain is worsened by certain positions. Pertinent negatives for primary symptoms include no fever, no fatigue, no shortness of breath, no cough and no abdominal pain.     Past Medical History  Diagnosis Date  . Hypertension   . Migraines   . Anxiety     Past Surgical History  Procedure Date  . Abdominal hysterectomy   . Gastrectomy     No family history on file.  History  Substance Use Topics  . Smoking status: Current Everyday Smoker -- 0.5 packs/day    Types: Cigarettes  . Smokeless tobacco: Not on file  . Alcohol Use: Yes    OB History    Grav Para Term Preterm Abortions TAB SAB Ect Mult Living                  Review of Systems  Constitutional: Negative for fever and fatigue.  Respiratory: Positive for chest tightness. Negative for cough and shortness of breath.   Cardiovascular: Positive for chest pain.  Gastrointestinal: Negative for abdominal pain, diarrhea and constipation.  All other systems reviewed and are negative.    Physical Exam  BP 150/99  Pulse 89  Temp(Src) 98.2 F (36.8 C) (Oral)  Resp 24  SpO2 100%  Physical Exam  Nursing note and vitals reviewed. Constitutional: She is oriented to person, place, and time. She appears well-developed and well-nourished. No distress.  HENT:  Head: Normocephalic and atraumatic.  Neck: Normal range of motion. Neck supple.  Cardiovascular: Normal rate  and regular rhythm.  Exam reveals no gallop and no friction rub.   No murmur heard. Pulmonary/Chest: Effort normal and breath sounds normal. No respiratory distress.  Abdominal: Soft. She exhibits no distension. There is no tenderness.  Musculoskeletal: Normal range of motion.  Lymphadenopathy:    She has no cervical adenopathy.  Neurological: She is alert and oriented to person, place, and time.  Skin: Skin is warm and dry. She is not diaphoretic.    Date: 07/16/2011  Rate: 70  Rhythm: normal sinus rhythm  QRS Axis: normal  Intervals: normal  ST/T Wave abnormalities: normal  Conduction Disutrbances:none  Narrative Interpretation:   Old EKG Reviewed: none available   ED Course  Procedures  MDM EKG, labs all okay.  Symptoms are atypical for heart pain.  Will discharge with close follow up.      Geoffery Lyons, MD 06/20/11 [id number]  Geoffery Lyons, MD 07/16/11 [phone]

## 2011-08-19 ENCOUNTER — Emergency Department (INDEPENDENT_AMBULATORY_CARE_PROVIDER_SITE_OTHER): Payer: Managed Care, Other (non HMO)

## 2011-08-19 ENCOUNTER — Encounter (HOSPITAL_BASED_OUTPATIENT_CLINIC_OR_DEPARTMENT_OTHER): Payer: Self-pay

## 2011-08-19 ENCOUNTER — Emergency Department (HOSPITAL_BASED_OUTPATIENT_CLINIC_OR_DEPARTMENT_OTHER)
Admission: EM | Admit: 2011-08-19 | Discharge: 2011-08-19 | Disposition: A | Discharge status: Hospice | Payer: Managed Care, Other (non HMO) | Attending: Emergency Medicine | Admitting: Emergency Medicine

## 2011-08-19 DIAGNOSIS — R0602 Shortness of breath: Secondary | ICD-10-CM | POA: Insufficient documentation

## 2011-08-19 DIAGNOSIS — R05 Cough: Secondary | ICD-10-CM

## 2011-08-19 DIAGNOSIS — R42 Dizziness and giddiness: Secondary | ICD-10-CM | POA: Insufficient documentation

## 2011-08-19 DIAGNOSIS — F17200 Nicotine dependence, unspecified, uncomplicated: Secondary | ICD-10-CM

## 2011-08-19 DIAGNOSIS — R079 Chest pain, unspecified: Secondary | ICD-10-CM | POA: Insufficient documentation

## 2011-08-19 DIAGNOSIS — I10 Essential (primary) hypertension: Secondary | ICD-10-CM | POA: Insufficient documentation

## 2011-08-19 LAB — TROPONIN I: Troponin I: <0.3 ng/mL (ref <0.30)

## 2011-08-19 LAB — BASIC METABOLIC PANEL
CO2: 24 mEq/L (ref 19–32)
Chloride: 103 mEq/L (ref 96–112)
Creatinine, Ser: 0.7 mg/dL (ref 0.50–1.10)
Potassium: 3.8 mEq/L (ref 3.5–5.1)
Sodium: 137 mEq/L (ref 135–145)

## 2011-08-19 LAB — CBC
MCV: 90.9 fL (ref 78.0–100.0)
Platelets: 201 10*3/uL (ref 150–400)
RBC: 4.06 MIL/uL (ref 3.87–5.11)
WBC: 4.7 10*3/uL (ref 4.0–10.5)

## 2011-08-19 MED ORDER — NITROGLYCERIN 0.4 MG SL SUBL
0.4000 mg | SUBLINGUAL_TABLET | SUBLINGUAL | Status: AC | PRN
Start: 2011-08-19 — End: 2011-08-19
  Administered 2011-08-19 (×3): 0.4 mg via SUBLINGUAL
  Filled 2011-08-19: qty 25

## 2011-08-19 MED ORDER — ASPIRIN 81 MG PO CHEW: 81.0000 mg | CHEWABLE_TABLET | Freq: Every day | ORAL | Status: DC

## 2011-08-19 MED ORDER — ACETAMINOPHEN 325 MG PO TABS
650.0000 mg | ORAL_TABLET | Freq: Once | ORAL | Status: AC
  Administered 2011-08-19: 325 mg via ORAL

## 2011-08-19 MED ORDER — ACETAMINOPHEN 325 MG PO TABS
ORAL_TABLET | ORAL | Status: AC
  Administered 2011-08-19: 325 mg via ORAL
  Filled 2011-08-19: qty 2

## 2011-08-19 MED ORDER — ASPIRIN 81 MG PO CHEW
CHEWABLE_TABLET | ORAL | Status: AC
  Administered 2011-08-19: 81 mg via ORAL
  Filled 2011-08-19: qty 4

## 2011-08-19 MED ORDER — ASPIRIN 81 MG PO CHEW
324.0000 mg | CHEWABLE_TABLET | Freq: Once | ORAL | Status: AC
  Administered 2011-08-19: 81 mg via ORAL

## 2011-08-19 MED ORDER — SODIUM CHLORIDE 0.9 % IV SOLN
Freq: Once | INTRAVENOUS | Status: AC
  Administered 2011-08-19: 12:00:00 via INTRAVENOUS

## 2011-08-19 NOTE — ED Notes (Signed)
Pt reports onset of chest pain and SHOB that started 4 days ago.  She was seen by PMD 2 days ago and scheduled for a stress test next week.

## 2011-08-19 NOTE — ED Notes (Signed)
Pt informed of plan of care.  Awaiting admission/transfer.

## 2011-08-19 NOTE — ED Notes (Signed)
Pt transported to [facility] and stable upon transfer.

## 2011-08-19 NOTE — ED Notes (Signed)
MD at bedside. 

## 2011-08-19 NOTE — ED Notes (Signed)
PO fluids provided. 

## 2011-08-19 NOTE — ED Notes (Signed)
Report called to Lanora Manis, RN [facility] transport team

## 2011-08-19 NOTE — ED Provider Notes (Signed)
History     CSN: [id number] Arrival date & time: 08/19/2011 11:22 AM   First MD Initiated Contact with Patient 08/19/11 1132      Chief Complaint  Patient presents with  . Chest Pain     Patient is a 45 y.o. female presenting with chest pain. The history is provided by the patient.  Chest Pain The chest pain began 3 - 5 days ago. Episode Length: several minutes. Chest pain occurs intermittently. The chest pain is improving. The pain is associated with exertion. The severity of the pain is moderate. The quality of the pain is described as burning (pressure). The pain does not radiate. Chest pain is worsened by exertion. Primary symptoms include shortness of breath and dizziness. Pertinent negatives for primary symptoms include no fever, no syncope, no cough, no palpitations, no abdominal pain and no vomiting.  Dizziness does not occur with vomiting.    Pt reports recent CP, saw by PCP this week and was referred for stress test next week She reports pain returned this morning She reports recent CP with exertion that is new for her No recent travel/surgery No personal h/o CAD but +famiyl h/o CAD  Past Medical History  Diagnosis Date  . Hypertension   . Migraines   . Anxiety   . Anxiety     Past Surgical History  Procedure Date  . Abdominal hysterectomy   . Gastrectomy     fam hx - +CAD in multiple family members  History  Substance Use Topics  . Smoking status: Current Everyday Smoker -- 0.5 packs/day    Types: Cigarettes  . Smokeless tobacco: Never Used  . Alcohol Use: Yes     occasional    OB History    Grav Para Term Preterm Abortions TAB SAB Ect Mult Living                  Review of Systems  Constitutional: Negative for fever.  Respiratory: Positive for shortness of breath. Negative for cough.   Cardiovascular: Positive for chest pain. Negative for palpitations and syncope.  Gastrointestinal: Negative for vomiting and abdominal pain.  Neurological:  Positive for dizziness.  All other systems reviewed and are negative.    Allergies  Meperidine hcl; Percocet; and Septra  Home Medications   Current Outpatient Rx  Name Route Sig Dispense Refill  . OMEPRAZOLE 40 MG PO CPDR Oral Take 40 mg by mouth daily.      . TRAMADOL HCL 100 MG PO TB24 Oral Take 100 mg by mouth 2 (two) times daily as needed.      Marland Kitchen XANAX PO Oral Take by mouth.      . ALPRAZOLAM 0.25 MG PO TABS Oral Take 0.125 mg by mouth once as needed. For anxiety     . AMLODIPINE-OLMESARTAN 5-20 MG PO TABS Oral Take 1 tablet by mouth daily.      . MULTI-VITAMIN/MINERALS PO TABS Oral Take 1 tablet by mouth daily.      Marland Kitchen NORTRIPTYLINE HCL 25 MG PO CAPS Oral Take 25 mg by mouth at bedtime. For mgiraine     . NORTRIPTYLINE HCL PO Oral Take by mouth.      . OMEPRAZOLE MAGNESIUM 20 MG PO TBEC Oral Take 20 mg by mouth daily.      Marland Kitchen RABEPRAZOLE SODIUM 20 MG PO TBEC Oral Take 20 mg by mouth daily.        BP 121/98  Pulse 83  Temp(Src) 98.3 F (36.8 C) (Oral)  Resp  18  Ht 5\' 2"  (1.575 m)  Wt 125 lb (56.7 kg)  BMI 22.86 kg/m2  SpO2 100%  Physical Exam  CONSTITUTIONAL: Well developed/well nourished HEAD AND FACE: Normocephalic/atraumatic EYES: EOMI/PERRL ENMT: Mucous membranes moist NECK: supple no meningeal signs CV: S1/S2 noted, no murmurs/rubs/gallops noted LUNGS: Lungs are clear to auscultation bilaterally, no apparent distress ABDOMEN: soft, nontender, no rebound or guarding NEURO: Pt is awake/alert, moves all extremitiesx4 EXTREMITIES: pulses normal, full ROM, no edema SKIN: warm, color normal PSYCH: no abnormalities of mood noted   ED Course  Procedures (including critical care time)   Labs Reviewed  TROPONIN I  CBC  BASIC METABOLIC PANEL     MDM  Nursing notes reviewed and considered in documentation Previous records reviewed and considered All labs/vitals reviewed and considered xrays reviewed and considered    Date: 08/19/2011  Rate: 86   Rhythm: normal sinus rhythm  QRS Axis: normal  Intervals: normal  ST/T Wave abnormalities: normal  Conduction Disutrbances:none  Narrative Interpretation:   Old EKG Reviewed: unchanged    1:19 PM Pt improved after NTG/ASA Labs/cxr imaging reviewed, reassuring, normal Will transfer to [facility] for further eval D/w accepting physician for regional, dr Miguel Aschoff Pt updated      Joya Gaskins, MD 08/19/11 1320

## 2011-08-19 NOTE — ED Notes (Signed)
[facility] transport team at bedside.  IV documented as d/c'd prior to d/c however was left in place for transport.

## 2011-08-19 NOTE — ED Notes (Signed)
Pt also reports a recent cold with a productive cough.  She states she feels like her throat feels "tight" and chest pain increases with palpation.

## 2011-08-19 NOTE — ED Notes (Signed)
Report called to Emmit Alexanders, RN [facility].

## 2011-08-19 NOTE — ED Notes (Signed)
Family at bedside. 

## 2011-12-07 ENCOUNTER — Emergency Department (INDEPENDENT_AMBULATORY_CARE_PROVIDER_SITE_OTHER): Payer: Managed Care, Other (non HMO)

## 2011-12-07 ENCOUNTER — Encounter (HOSPITAL_BASED_OUTPATIENT_CLINIC_OR_DEPARTMENT_OTHER): Payer: Self-pay | Admitting: *Deleted

## 2011-12-07 ENCOUNTER — Encounter (HOSPITAL_COMMUNITY): Payer: Self-pay | Admitting: Emergency Medicine

## 2011-12-07 ENCOUNTER — Emergency Department (HOSPITAL_BASED_OUTPATIENT_CLINIC_OR_DEPARTMENT_OTHER)
Admission: EM | Admit: 2011-12-07 | Discharge: 2011-12-07 | Disposition: A | Discharge status: Other Acute Inpatient Hospital | Payer: Managed Care, Other (non HMO) | Source: Home / Self Care | Attending: Emergency Medicine | Admitting: Emergency Medicine

## 2011-12-07 ENCOUNTER — Emergency Department (HOSPITAL_COMMUNITY)
Admission: EM | Admit: 2011-12-07 | Discharge: 2011-12-08 | Disposition: A | Discharge status: Home / Self Care | Payer: Managed Care, Other (non HMO) | Attending: Emergency Medicine | Admitting: Emergency Medicine

## 2011-12-07 DIAGNOSIS — R110 Nausea: Secondary | ICD-10-CM | POA: Insufficient documentation

## 2011-12-07 DIAGNOSIS — F17200 Nicotine dependence, unspecified, uncomplicated: Secondary | ICD-10-CM | POA: Insufficient documentation

## 2011-12-07 DIAGNOSIS — F411 Generalized anxiety disorder: Secondary | ICD-10-CM | POA: Insufficient documentation

## 2011-12-07 DIAGNOSIS — I700 Atherosclerosis of aorta: Secondary | ICD-10-CM

## 2011-12-07 DIAGNOSIS — R109 Unspecified abdominal pain: Secondary | ICD-10-CM

## 2011-12-07 DIAGNOSIS — R1011 Right upper quadrant pain: Secondary | ICD-10-CM | POA: Insufficient documentation

## 2011-12-07 DIAGNOSIS — K228 Other specified diseases of esophagus: Secondary | ICD-10-CM

## 2011-12-07 DIAGNOSIS — I10 Essential (primary) hypertension: Secondary | ICD-10-CM | POA: Insufficient documentation

## 2011-12-07 LAB — LIPASE, BLOOD: Lipase: 40 U/L (ref 11–59)

## 2011-12-07 LAB — URINALYSIS, ROUTINE W REFLEX MICROSCOPIC
Glucose, UA: NEGATIVE mg/dL
Leukocytes, UA: NEGATIVE
Nitrite: NEGATIVE
Specific Gravity, Urine: 1.018 (ref 1.005–1.030)
pH: 6.5 (ref 5.0–8.0)

## 2011-12-07 LAB — COMPREHENSIVE METABOLIC PANEL
ALT: 9 U/L (ref 0–35)
Albumin: 3.7 g/dL (ref 3.5–5.2)
Alkaline Phosphatase: 43 U/L (ref 39–117)
BUN: 8 mg/dL (ref 6–23)
Chloride: 101 mEq/L (ref 96–112)
GFR calc Af Amer: >90 mL/min (ref >90)
Glucose, Bld: 87 mg/dL (ref 70–99)
Potassium: 3.8 mEq/L (ref 3.5–5.1)
Sodium: 137 mEq/L (ref 135–145)
Total Bilirubin: 0.2 mg/dL — ABNORMAL LOW (ref 0.3–1.2)

## 2011-12-07 LAB — DIFFERENTIAL
Lymphs Abs: 1.8 10*3/uL (ref 0.7–4.0)
Monocytes Relative: 13 % — ABNORMAL HIGH (ref 3–12)
Neutro Abs: 2 10*3/uL (ref 1.7–7.7)
Neutrophils Relative %: 45 % (ref 43–77)

## 2011-12-07 LAB — URINE MICROSCOPIC-ADD ON

## 2011-12-07 LAB — CBC
Hemoglobin: 13.2 g/dL (ref 12.0–15.0)
Platelets: 179 10*3/uL (ref 150–400)
RBC: 4.46 MIL/uL (ref 3.87–5.11)
WBC: 4.4 10*3/uL (ref 4.0–10.5)

## 2011-12-07 LAB — PREGNANCY, URINE: Preg Test, Ur: NEGATIVE

## 2011-12-07 MED ORDER — SODIUM CHLORIDE 0.9 % IV BOLUS (SEPSIS)
1000.0000 mL | Freq: Once | INTRAVENOUS | Status: AC
  Administered 2011-12-07: 1000 mL via INTRAVENOUS

## 2011-12-07 MED ORDER — PANTOPRAZOLE SODIUM 40 MG IV SOLR
40.0000 mg | Freq: Once | INTRAVENOUS | Status: AC
  Administered 2011-12-07: 40 mg via INTRAVENOUS
  Filled 2011-12-07: qty 40

## 2011-12-07 MED ORDER — ONDANSETRON HCL 4 MG/2ML IJ SOLN
4.0000 mg | Freq: Once | INTRAMUSCULAR | Status: AC
  Administered 2011-12-07: 4 mg via INTRAVENOUS
  Filled 2011-12-07: qty 2

## 2011-12-07 MED ORDER — GI COCKTAIL ~~LOC~~
30.0000 mL | Freq: Once | ORAL | Status: AC
  Administered 2011-12-07: 30 mL via ORAL
  Filled 2011-12-07: qty 30

## 2011-12-07 NOTE — ED Notes (Signed)
Lower abdominal pain worse on the right side with radiation into her flank. States her bowel movements have been "greasy" and she thinks she may have seen blood in her stools this week. She has had a 7# weight loss over the past month. She is concerned that she might have cancer.

## 2011-12-07 NOTE — ED Notes (Signed)
Dr Manus Gunning spoke to the charge nurse at [facility] and pt will go to [facility]

## 2011-12-07 NOTE — ED Notes (Signed)
Pt is going to [facility] to have gallbladder ultrasound tonight.

## 2011-12-07 NOTE — ED Notes (Signed)
Pt c/o right flank pain x 5 days

## 2011-12-07 NOTE — ED Notes (Signed)
Patient complaining of abdominal pain that started 2-3 days ago; patient was seen at [facility] this evening and was told to come to [facility] for an ultrasound; charge nurse aware.  Abdominal pain is on right side (upper and lower quadrants).  No nausea or vomiting reported today.

## 2011-12-07 NOTE — ED Provider Notes (Signed)
History     CSN: [id number]  Arrival date & time 12/07/11  1478   First MD Initiated Contact with Patient 12/07/11 1956      Chief Complaint  Patient presents with  . Flank Pain    (Consider location/radiation/quality/duration/timing/severity/associated sxs/prior treatment) HPI Comments: Patient has history of acid reflux and is presenting with 5 days of epigastric, right upper quadrant, right flank and back pain. The pain is intermittent but she is not appreciate any provoking factors. Is not worse with movement it is not worse with eating. She feels nauseated but has not vomited. She denies any change in bowel habits. She denies any dysuria hematuria, vaginal bleeding or discharge. She states compliance with her reflux medications. Her pain is worse in her epigastrium and right upper quadrant and radiates to her mid right back.  The history is provided by the patient.    Past Medical History  Diagnosis Date  . Hypertension   . Migraines   . Anxiety   . Anxiety     Past Surgical History  Procedure Date  . Abdominal hysterectomy   . Gastrectomy     History reviewed. No pertinent family history.  History  Substance Use Topics  . Smoking status: Current Everyday Smoker -- 0.5 packs/day    Types: Cigarettes  . Smokeless tobacco: Never Used  . Alcohol Use: Yes     occasional    OB History    Grav Para Term Preterm Abortions TAB SAB Ect Mult Living                  Review of Systems  Constitutional: Negative for activity change and appetite change.  HENT: Negative for congestion and rhinorrhea.   Eyes: Negative for visual disturbance.  Respiratory: Negative for cough, chest tightness and shortness of breath.   Cardiovascular: Negative for chest pain.  Gastrointestinal: Positive for nausea and abdominal pain. Negative for vomiting, diarrhea and constipation.  Genitourinary: Positive for flank pain. Negative for dysuria, vaginal bleeding and vaginal discharge.    Musculoskeletal: Positive for back pain.  Skin: Negative for rash.  Neurological: Negative for headaches.    Allergies  Meperidine hcl; Percocet; and Septra  Home Medications   Current Outpatient Rx  Name Route Sig Dispense Refill  . ALPRAZOLAM 0.25 MG PO TABS Oral Take 0.125 mg by mouth once as needed. For anxiety     . AMLODIPINE-OLMESARTAN 5-20 MG PO TABS Oral Take 1 tablet by mouth daily.      . MULTI-VITAMIN/MINERALS PO TABS Oral Take 1 tablet by mouth daily.      Marland Kitchen OMEPRAZOLE MAGNESIUM 20 MG PO TBEC Oral Take 20 mg by mouth daily.      Marland Kitchen OMEPRAZOLE 40 MG PO CPDR Oral Take 40 mg by mouth daily.      Marland Kitchen RABEPRAZOLE SODIUM 20 MG PO TBEC Oral Take 20 mg by mouth daily.      . TRAMADOL HCL ER 100 MG PO TB24 Oral Take 100 mg by mouth 2 (two) times daily as needed.        BP 167/114  Pulse 93  Temp(Src) 98.5 F (36.9 C) (Oral)  Resp 16  Ht 5\' 2"  (1.575 m)  Wt 125 lb (56.7 kg)  BMI 22.86 kg/m2  SpO2 100%  Physical Exam  Constitutional: She is oriented to person, place, and time. She appears well-developed and well-nourished. No distress.  HENT:  Head: Normocephalic and atraumatic.  Mouth/Throat: Oropharynx is clear and moist. No oropharyngeal exudate.  Eyes:  Conjunctivae are normal. Pupils are equal, round, and reactive to light.  Neck: Normal range of motion. Neck supple.  Cardiovascular: Normal rate, regular rhythm and normal heart sounds.   Pulmonary/Chest: Effort normal and breath sounds normal. No respiratory distress.  Abdominal: Soft. There is tenderness. There is no rebound and no guarding.       Mild epigastric and right upper quadrant pain, no Murphy sign  Musculoskeletal: She exhibits tenderness.       Right thoracic and lumbar paraspinal tenderness  Neurological: She is alert and oriented to person, place, and time.  Skin: Skin is warm.    ED Course  Procedures (including critical care time)  Labs Reviewed  URINALYSIS, ROUTINE W REFLEX MICROSCOPIC -  Abnormal; Notable for the following:    Hgb urine dipstick SMALL (*)    All other components within normal limits  DIFFERENTIAL - Abnormal; Notable for the following:    Monocytes Relative 13 (*)    All other components within normal limits  COMPREHENSIVE METABOLIC PANEL - Abnormal; Notable for the following:    Total Bilirubin 0.2 (*)    All other components within normal limits  URINE MICROSCOPIC-ADD ON - Abnormal; Notable for the following:    Squamous Epithelial / LPF FEW (*)    Bacteria, UA MANY (*)    All other components within normal limits  PREGNANCY, URINE  CBC  LIPASE, BLOOD   Ct Abdomen Pelvis Wo Contrast  12/07/2011  *RADIOLOGY REPORT*  Clinical Data: Right flank pain.  CT ABDOMEN AND PELVIS WITHOUT CONTRAST  Technique:  Multidetector CT imaging of the abdomen and pelvis was performed following the standard protocol without intravenous contrast.  Comparison: 12/04/2010  Findings: Dilated distal esophagus contains a dependent density, only partially visualized, query pill fragment or a tiny amount of oral contrast.  The liver and spleen appear normal, and no pancreatic contour abnormality is observed.  Left adrenal gland appears normal.  The gallbladder appears contracted. The kidneys appear unremarkable, as do the proximal ureters.  No ureteral calculus is observed.  The distal ileum appears unremarkable.  The appendix tracks anterior to the psoas muscle on image 43 of series 2, without associated inflammation.  Atherosclerotic calcification of the abdominal aorta is noted. Formed stool noted in the colon.  Scattered air-fluid levels are present in nondilated small bowel.  Urinary bladder appears unremarkable.  Uterus is absent.  No free pelvic fluid is observed.  Ovarian contour is unremarkable.  No pathologic retroperitoneal or porta hepatis adenopathy is identified.  No pathologic pelvic adenopathy is identified.  There is ill definition of fat planes adjacent to the IVC just below  its intrahepatic portion. This mildly obscures the right adrenal gland.  IMPRESSION:  1.  Poor definition of fat planes in the right upper quadrant could reflect regional inflammation, possibly from the gastric or peptic ulcer disease, pancreatitis, gallbladder disease, adrenal inflammation/hemorrhage, or inflammation along the porta hepatis. 2.  The appendix and right kidney appear normal. 3.  Dilated distal esophagus. 4.  Atherosclerotic calcification of the abdominal aorta  Original Report Authenticated By: Dellia Cloud, M.D.     1. RUQ pain       MDM  Epigastric, right upper quadrant, right flank pain x5 days with nausea.  Labs reviewed with patient. LFT is normal limits, lipase within normal limits. Urinalysis without evidence of infection.  Right upper quadrant inflammation on CT scan that is nonspecific. Patient has negative Murphy sign. I gave the patient the option of getting  an ultrasound tonight, versus coming back tomorrow. She would [facility] get it done tonight at [facility]. She will transfer herself by private vehicle. She's not had any narcotic medications  I discussed her transfer with Clydie Braun the charge nurse at [facility] as well as Dr. Wonda Amis resident.       Glynn Octave, MD 12/07/11 2325

## 2011-12-08 ENCOUNTER — Other Ambulatory Visit (HOSPITAL_BASED_OUTPATIENT_CLINIC_OR_DEPARTMENT_OTHER): Payer: Managed Care, Other (non HMO)

## 2011-12-08 ENCOUNTER — Emergency Department (HOSPITAL_COMMUNITY): Payer: Managed Care, Other (non HMO)

## 2011-12-08 MED ORDER — HYDROCODONE-ACETAMINOPHEN 5-325 MG PO TABS: 2.0000 | ORAL_TABLET | ORAL | Status: AC | PRN

## 2011-12-08 MED ORDER — ACETAMINOPHEN 325 MG PO TABS
650.0000 mg | ORAL_TABLET | Freq: Once | ORAL | Status: AC
  Administered 2011-12-08: 650 mg via ORAL
  Filled 2011-12-08: qty 2

## 2011-12-08 MED ORDER — FAMOTIDINE 20 MG PO TABS: 20.0000 mg | ORAL_TABLET | Freq: Two times a day (BID) | ORAL | Status: DC | PRN

## 2011-12-08 NOTE — Discharge Instructions (Signed)
Abdominal Pain (Nonspecific) Your exam might not show the exact reason you have abdominal pain. Since there are many different causes of abdominal pain, another checkup and more tests may be needed. It is very important to follow up for lasting (persistent) or worsening symptoms. A possible cause of abdominal pain in any person who still has his or her appendix is acute appendicitis. Appendicitis is often hard to diagnose. Normal blood tests, urine tests, ultrasound, and CT scans do not completely rule out early appendicitis or other causes of abdominal pain. Sometimes, only the changes that happen over time will allow appendicitis and other causes of abdominal pain to be determined. Other potential problems that may require surgery may also take time to become more apparent. Because of this, it is important that you follow all of the instructions below. HOME CARE INSTRUCTIONS   Rest as much as possible.   Do not eat solid food until your pain is gone.   While adults or children have pain: A diet of water, weak decaffeinated tea, broth or bouillon, gelatin, oral rehydration solutions (ORS), frozen ice pops, or ice chips may be helpful.   When pain is gone in adults or children: Start a light diet (dry toast, crackers, applesauce, or white rice). Increase the diet slowly as long as it does not bother you. Eat no dairy products (including cheese and eggs) and no spicy, fatty, fried, or high-fiber foods.   Use no alcohol, caffeine, or cigarettes.   Take your regular medicines unless your caregiver told you not to.   Take any prescribed medicine as directed.   Only take over-the-counter or prescription medicines for pain, discomfort, or fever as directed by your caregiver. Do not give aspirin to children.  If your caregiver has given you a follow-up appointment, it is very important to keep that appointment. Not keeping the appointment could result in a permanent injury and/or lasting (chronic) pain  and/or disability. If there is any problem keeping the appointment, you must call to reschedule.  SEEK IMMEDIATE MEDICAL CARE IF:   Your pain is not gone in 24 hours.   Your pain becomes worse, changes location, or feels different.   You or your child has an oral temperature above 102 F (38.9 C), not controlled by medicine.   Your baby is older than 3 months with a rectal temperature of 102 F (38.9 C) or higher.   Your baby is 3 months old or younger with a rectal temperature of 100.4 F (38 C) or higher.   You have shaking chills.   You keep throwing up (vomiting) or cannot drink liquids.   There is blood in your vomit or you see blood in your bowel movements.   Your bowel movements become dark or black.   You have frequent bowel movements.   Your bowel movements stop (become blocked) or you cannot pass gas.   You have bloody, frequent, or painful urination.   You have yellow discoloration in the skin or whites of the eyes.   Your stomach becomes bloated or bigger.   You have dizziness or fainting.   You have chest or back pain.  MAKE SURE YOU:   Understand these instructions.   Will watch your condition.   Will get help right away if you are not doing well or get worse.  Document Released: 10/03/2005 Document Revised: 06/15/2011 Document Reviewed: 08/31/2009 [employer] Patient Information 2012 [employer]. 

## 2011-12-08 NOTE — ED Provider Notes (Signed)
12:46 AM The patient was handed off to me for further care in the ED from Dr.Rancour at the [facility] ER, sent by private vehicle to our ED to obtain an ultrasound of her abdomen to evaluate for possible cholecystitis. She has had a full emergency department evaluation earlier today, and I would direct you to that history and physical examination for further review and information. The patient reports approximately 5 days of intermittent right upper quadrant cramping and pain, radiating around to her right flank. She does report that the pain was worse after eating lunch today. She denies any fever, reports nausea, but denies vomiting or diarrhea. Her pain is currently at approximately 6/10 in intensity. Physical Exam  BP 168/106  Pulse 98  Temp(Src) 98.3 F (36.8 C) (Oral)  Resp 20  SpO2 100%  Physical Exam The patient is awake, alert, and oriented to person, place, time, and event and in no apparent distress, behaving normally. She is breathing easily with unlabored respirations, clear lung sounds in all fields without wheezes, rales, or rhonchi. Her heart sounds are normal with a regular rate and rhythm. Her abdomen demonstrates normal bowel sounds, no distention, mild tenderness to the epigastrium and right upper quadrant, with no guarding or rebound tenderness. There is no jaundice to her skin. ED Course  Procedures  MDM Review of the previous CT from earlier today demonstrates nonspecific possible inflammation around the gallbladder. The patient may have cholecystitis, however has no fever, no leukocytosis, and normal liver enzymes. We will obtain an ultrasound of the patient's abdomen to evaluate for cholecystitis. If negative for cholecystitis, the pain may be musculoskeletal, or the patient may have a urinary tract infection.      Felisa Bonier, MD 12/08/11 [phone]

## 2011-12-08 NOTE — ED Notes (Signed)
Patient back from [place].

## 2011-12-21 ENCOUNTER — Encounter (HOSPITAL_BASED_OUTPATIENT_CLINIC_OR_DEPARTMENT_OTHER): Payer: Self-pay | Admitting: Student

## 2011-12-21 ENCOUNTER — Emergency Department (HOSPITAL_BASED_OUTPATIENT_CLINIC_OR_DEPARTMENT_OTHER)
Admission: EM | Admit: 2011-12-21 | Discharge: 2011-12-21 | Disposition: A | Discharge status: Home / Self Care | Payer: Managed Care, Other (non HMO) | Attending: Emergency Medicine | Admitting: Emergency Medicine

## 2011-12-21 DIAGNOSIS — R141 Gas pain: Secondary | ICD-10-CM | POA: Insufficient documentation

## 2011-12-21 DIAGNOSIS — M545 Low back pain, unspecified: Secondary | ICD-10-CM | POA: Insufficient documentation

## 2011-12-21 DIAGNOSIS — R1033 Periumbilical pain: Secondary | ICD-10-CM | POA: Insufficient documentation

## 2011-12-21 DIAGNOSIS — R109 Unspecified abdominal pain: Secondary | ICD-10-CM | POA: Insufficient documentation

## 2011-12-21 DIAGNOSIS — R197 Diarrhea, unspecified: Secondary | ICD-10-CM | POA: Insufficient documentation

## 2011-12-21 DIAGNOSIS — I10 Essential (primary) hypertension: Secondary | ICD-10-CM | POA: Insufficient documentation

## 2011-12-21 DIAGNOSIS — R142 Eructation: Secondary | ICD-10-CM | POA: Insufficient documentation

## 2011-12-21 LAB — COMPREHENSIVE METABOLIC PANEL
AST: 17 U/L (ref 0–37)
CO2: 25 mEq/L (ref 19–32)
Calcium: 9.8 mg/dL (ref 8.4–10.5)
Creatinine, Ser: 0.7 mg/dL (ref 0.50–1.10)
GFR calc Af Amer: >90 mL/min (ref >90)
GFR calc non Af Amer: >90 mL/min (ref >90)

## 2011-12-21 LAB — CBC
HCT: 37.7 % (ref 36.0–46.0)
MCH: 30.2 pg (ref 26.0–34.0)
MCHC: 34.2 g/dL (ref 30.0–36.0)
MCV: 88.3 fL (ref 78.0–100.0)
RDW: 12.8 % (ref 11.5–15.5)

## 2011-12-21 LAB — DIFFERENTIAL
Basophils Absolute: 0 10*3/uL (ref 0.0–0.1)
Eosinophils Absolute: 0.1 10*3/uL (ref 0.0–0.7)
Eosinophils Relative: 1 % (ref 0–5)
Lymphocytes Relative: 49 % — ABNORMAL HIGH (ref 12–46)
Monocytes Absolute: 0.4 10*3/uL (ref 0.1–1.0)

## 2011-12-21 LAB — URINALYSIS, ROUTINE W REFLEX MICROSCOPIC
Ketones, ur: NEGATIVE mg/dL
Leukocytes, UA: NEGATIVE
Nitrite: NEGATIVE
Protein, ur: NEGATIVE mg/dL
pH: 6.5 (ref 5.0–8.0)

## 2011-12-21 LAB — LACTIC ACID, PLASMA: Lactic Acid, Venous: 0.7 mmol/L (ref 0.5–2.2)

## 2011-12-21 MED ORDER — ACETAMINOPHEN 325 MG PO TABS
650.0000 mg | ORAL_TABLET | Freq: Once | ORAL | Status: AC
  Administered 2011-12-21: 650 mg via ORAL
  Filled 2011-12-21: qty 2

## 2011-12-21 NOTE — Discharge Instructions (Signed)
Abdominal Pain (Nonspecific)  Your exam might not show the exact reason you have abdominal pain. Since there are many different causes of abdominal pain, another checkup and more tests may be needed. It is very important to follow up for lasting (persistent) or worsening symptoms. A possible cause of abdominal pain in any person who still has his or her appendix is acute appendicitis. Appendicitis is often hard to diagnose. Normal blood tests, urine tests, ultrasound, and CT scans do not completely rule out early appendicitis or other causes of abdominal pain. Sometimes, only the changes that happen over time will allow appendicitis and other causes of abdominal pain to be determined. Other potential problems that may require surgery may also take time to become more apparent. Because of this, it is important that you follow all of the instructions below.  HOME CARE INSTRUCTIONS    Rest as much as possible.   Do not eat solid food until your pain is gone.   While adults or children have pain: A diet of water, weak decaffeinated tea, broth or bouillon, gelatin, oral rehydration solutions (ORS), frozen ice pops, or ice chips may be helpful.   When pain is gone in adults or children: Start a light diet (dry toast, crackers, applesauce, or white rice). Increase the diet slowly as long as it does not bother you. Eat no dairy products (including cheese and eggs) and no spicy, fatty, fried, or high-fiber foods.   Use no alcohol, caffeine, or cigarettes.   Take your regular medicines unless your caregiver told you not to.   Take any prescribed medicine as directed.   Only take over-the-counter or prescription medicines for pain, discomfort, or fever as directed by your caregiver. Do not give aspirin to children.  If your caregiver has given you a follow-up appointment, it is very important to keep that appointment. Not keeping the appointment could result in a permanent injury and/or lasting (chronic) pain and/or  disability. If there is any problem keeping the appointment, you must call to reschedule.   SEEK IMMEDIATE MEDICAL CARE IF:    Your pain is not gone in 24 hours.   Your pain becomes worse, changes location, or feels different.   You or your child has an oral temperature above 102 F (38.9 C), not controlled by medicine.   Your baby is older than 3 months with a rectal temperature of 102 F (38.9 C) or higher.   Your baby is 3 months old or younger with a rectal temperature of 100.4 F (38 C) or higher.   You have shaking chills.   You keep throwing up (vomiting) or cannot drink liquids.   There is blood in your vomit or you see blood in your bowel movements.   Your bowel movements become dark or black.   You have frequent bowel movements.   Your bowel movements stop (become blocked) or you cannot pass gas.   You have bloody, frequent, or painful urination.   You have yellow discoloration in the skin or whites of the eyes.   Your stomach becomes bloated or bigger.   You have dizziness or fainting.   You have chest or back pain.  MAKE SURE YOU:    Understand these instructions.   Will watch your condition.   Will get help right away if you are not doing well or get worse.  Document Released: 10/03/2005 Document Revised: 09/22/2011 Document Reviewed: 08/31/2009  [employer] Patient Information 2012 [employer].

## 2011-12-21 NOTE — ED Notes (Signed)
Pt in with c/o diarrhea yesterday and reports coffee ground and colored bowel movements. Reports intense abdominal bloating and pain. Denies N V D CP LOC SOB. Reports pain to lower abd with radiating to lower back.

## 2011-12-21 NOTE — ED Provider Notes (Signed)
History     CSN: [id number]  Arrival date & time 12/21/11  1954   First MD Initiated Contact with Patient 12/21/11 2038      Chief Complaint  Patient presents with  . Abdominal Pain    (Consider location/radiation/quality/duration/timing/severity/associated sxs/prior treatment) HPI   C/o abdominal pain constant x few weeks. Worse over the past few days. C/O intermittent abdominal distension without increased gas. Describes periumbilical pain with radiation to Rt flank and R lower back similar to previous. 5/10 at this time. Denies nausea/vomiting/fever/chills. Describes 1-2 episodes of diarrhea, last yesterday. Tonight with "mushy stool that almost looked like coffee grounds in my stool".   Had EGD done yesterday [facility]. Colonscopy scheduled for March 19th. Has not taken anything for pain prior to arrival.  Denies hematuria/dysuria/urgency. +urinary frequency.  ED Notes, ED Provider Notes from 12/21/11 0000 to 12/21/11 84:[id number]       Liliane Shi, RN 12/21/2011 20:10      Pt in with c/o diarrhea yesterday and reports coffee ground and colored bowel movements. Reports intense abdominal bloating and pain. Denies N V D CP LOC SOB. Reports pain to lower abd with radiating to lower back.      Past Medical History  Diagnosis Date  . Hypertension   . Migraines   . Anxiety   . Anxiety     Past Surgical History  Procedure Date  . Abdominal hysterectomy   . Gastrectomy     History reviewed. No pertinent family history.  History  Substance Use Topics  . Smoking status: Current Everyday Smoker -- 0.5 packs/day    Types: Cigarettes  . Smokeless tobacco: Never Used  . Alcohol Use: Yes     occasional    OB History    Grav Para Term Preterm Abortions TAB SAB Ect Mult Living                  Review of Systems  All other systems reviewed and are negative.   except as noted HPI   Allergies  Adhesive; Meperidine hcl; Percocet; and Septra  Home Medications     Current Outpatient Rx  Name Route Sig Dispense Refill  . ALPRAZOLAM 0.25 MG PO TABS Oral Take 0.125 mg by mouth once as needed. For anxiety    . AMLODIPINE-OLMESARTAN 5-20 MG PO TABS Oral Take 1 tablet by mouth daily.      . OMEGA-3 FATTY ACIDS 1000 MG PO CAPS Oral Take 1 g by mouth 3 (three) times daily.    . MULTI-VITAMIN/MINERALS PO TABS Oral Take 1 tablet by mouth daily.      Marland Kitchen RABEPRAZOLE SODIUM 20 MG PO TBEC Oral Take 20 mg by mouth daily.        BP 158/108  Pulse 95  Temp(Src) 98 F (36.7 C) (Oral)  Resp 20  Wt 125 lb (56.7 kg)  SpO2 100%  Physical Exam  Nursing note and vitals reviewed. Constitutional: She is oriented to person, place, and time. She appears well-developed.  HENT:  Head: Atraumatic.  Mouth/Throat: Oropharynx is clear and moist.  Eyes: Conjunctivae and EOM are normal. Pupils are equal, round, and reactive to light.  Neck: Normal range of motion. Neck supple.  Cardiovascular: Normal rate, regular rhythm, normal heart sounds and intact distal pulses.   Pulmonary/Chest: Effort normal and breath sounds normal. No respiratory distress. She has no wheezes. She has no rales.  Abdominal: Soft. She exhibits no distension. There is tenderness. There is no rebound and no guarding.  Mild diffuse ttp > epigastric  Genitourinary:       Brown stool, heme neg  Musculoskeletal: Normal range of motion.  Neurological: She is alert and oriented to person, place, and time.  Skin: Skin is warm and dry. No rash noted.  Psychiatric: She has a normal mood and affect.    ED Course  Procedures (including critical care time)  Labs Reviewed  DIFFERENTIAL - Abnormal; Notable for the following:    Neutrophils Relative 42 (*)    Lymphocytes Relative 49 (*)    All other components within normal limits  COMPREHENSIVE METABOLIC PANEL - Abnormal; Notable for the following:    Total Bilirubin 0.2 (*)    All other components within normal limits  LIPASE, BLOOD - Abnormal;  Notable for the following:    Lipase 61 (*)    All other components within normal limits  URINALYSIS, ROUTINE W REFLEX MICROSCOPIC - Abnormal; Notable for the following:    Hgb urine dipstick TRACE (*)    All other components within normal limits  OCCULT BLOOD X 1 CARD TO LAB, STOOL  CBC  LACTIC ACID, PLASMA  PREGNANCY, URINE  URINE MICROSCOPIC-ADD ON   No results found.   1. Abdominal pain     MDM  Abdominal pain unclear etiology. Previous CT A/P with nonspecific findings, U/S Abd unremarkable. Has close GI f/u with EGD yesterday unremarkable. Plans for upcoming colonoscopy. Repeat labs today unremarkable x slightly elevated lipase. Pt declining narcotic analgesia, asking for tylenol only. Min improvement of pain. VSS. I do feel that she is stable for discharge home with GI f/u. I do not feel that an additional CT AP is necessary at this time. Encouraged to call Dr. Noe Gens for earlier appointment.         Forbes Cellar, MD 12/22/11 0028

## 2012-03-27 ENCOUNTER — Encounter (HOSPITAL_BASED_OUTPATIENT_CLINIC_OR_DEPARTMENT_OTHER): Payer: Self-pay | Admitting: Family Medicine

## 2012-03-27 ENCOUNTER — Emergency Department (HOSPITAL_BASED_OUTPATIENT_CLINIC_OR_DEPARTMENT_OTHER)
Admission: EM | Admit: 2012-03-27 | Discharge: 2012-03-27 | Disposition: A | Discharge status: Home / Self Care | Payer: Managed Care, Other (non HMO) | Attending: Emergency Medicine | Admitting: Emergency Medicine

## 2012-03-27 ENCOUNTER — Emergency Department (HOSPITAL_BASED_OUTPATIENT_CLINIC_OR_DEPARTMENT_OTHER): Payer: Managed Care, Other (non HMO)

## 2012-03-27 DIAGNOSIS — I10 Essential (primary) hypertension: Secondary | ICD-10-CM | POA: Insufficient documentation

## 2012-03-27 DIAGNOSIS — R82998 Other abnormal findings in urine: Secondary | ICD-10-CM | POA: Insufficient documentation

## 2012-03-27 DIAGNOSIS — Z79899 Other long term (current) drug therapy: Secondary | ICD-10-CM | POA: Insufficient documentation

## 2012-03-27 DIAGNOSIS — R112 Nausea with vomiting, unspecified: Secondary | ICD-10-CM | POA: Insufficient documentation

## 2012-03-27 DIAGNOSIS — F17200 Nicotine dependence, unspecified, uncomplicated: Secondary | ICD-10-CM | POA: Insufficient documentation

## 2012-03-27 DIAGNOSIS — N390 Urinary tract infection, site not specified: Secondary | ICD-10-CM

## 2012-03-27 DIAGNOSIS — R197 Diarrhea, unspecified: Secondary | ICD-10-CM | POA: Insufficient documentation

## 2012-03-27 DIAGNOSIS — G43909 Migraine, unspecified, not intractable, without status migrainosus: Secondary | ICD-10-CM | POA: Insufficient documentation

## 2012-03-27 DIAGNOSIS — F411 Generalized anxiety disorder: Secondary | ICD-10-CM | POA: Insufficient documentation

## 2012-03-27 LAB — URINE MICROSCOPIC-ADD ON

## 2012-03-27 LAB — CBC
HCT: 39.1 % (ref 36.0–46.0)
Hemoglobin: 13.5 g/dL (ref 12.0–15.0)
MCHC: 34.5 g/dL (ref 30.0–36.0)
MCV: 90.5 fL (ref 78.0–100.0)
RDW: 13.1 % (ref 11.5–15.5)
WBC: 3.7 10*3/uL — ABNORMAL LOW (ref 4.0–10.5)

## 2012-03-27 LAB — URINALYSIS, ROUTINE W REFLEX MICROSCOPIC
Glucose, UA: NEGATIVE mg/dL
Specific Gravity, Urine: 1.024 (ref 1.005–1.030)
pH: 5.5 (ref 5.0–8.0)

## 2012-03-27 LAB — COMPREHENSIVE METABOLIC PANEL
ALT: 13 U/L (ref 0–35)
Albumin: 4 g/dL (ref 3.5–5.2)
Alkaline Phosphatase: 38 U/L — ABNORMAL LOW (ref 39–117)
Potassium: 4.1 mEq/L (ref 3.5–5.1)
Sodium: 138 mEq/L (ref 135–145)
Total Protein: 7.4 g/dL (ref 6.0–8.3)

## 2012-03-27 LAB — DIFFERENTIAL
Basophils Absolute: 0 10*3/uL (ref 0.0–0.1)
Eosinophils Relative: 2 % (ref 0–5)
Lymphocytes Relative: 40 % (ref 12–46)
Monocytes Absolute: 0.2 10*3/uL (ref 0.1–1.0)
Monocytes Relative: 7 % (ref 3–12)

## 2012-03-27 MED ORDER — ONDANSETRON HCL 4 MG/2ML IJ SOLN
4.0000 mg | Freq: Once | INTRAMUSCULAR | Status: AC
  Administered 2012-03-27: 4 mg via INTRAVENOUS
  Filled 2012-03-27: qty 2

## 2012-03-27 MED ORDER — KETOROLAC TROMETHAMINE 30 MG/ML IJ SOLN
30.0000 mg | Freq: Once | INTRAMUSCULAR | Status: AC
  Administered 2012-03-27: 30 mg via INTRAVENOUS
  Filled 2012-03-27: qty 1

## 2012-03-27 MED ORDER — GI COCKTAIL ~~LOC~~
30.0000 mL | Freq: Once | ORAL | Status: AC
  Administered 2012-03-27: 30 mL via ORAL
  Filled 2012-03-27: qty 30

## 2012-03-27 MED ORDER — METOCLOPRAMIDE HCL 10 MG PO TABS: 10.0000 mg | ORAL_TABLET | Freq: Four times a day (QID) | ORAL | Status: DC

## 2012-03-27 MED ORDER — NITROFURANTOIN MONOHYD MACRO 100 MG PO CAPS: 100.0000 mg | ORAL_CAPSULE | Freq: Two times a day (BID) | ORAL | Status: AC

## 2012-03-27 MED ORDER — SODIUM CHLORIDE 0.9 % IV BOLUS (SEPSIS)
1000.0000 mL | Freq: Once | INTRAVENOUS | Status: AC
  Administered 2012-03-27: 1000 mL via INTRAVENOUS

## 2012-03-27 NOTE — ED Notes (Signed)
MD at bedside. 

## 2012-03-27 NOTE — ED Notes (Signed)
Pt c/o n/v since last Thursday with intermittent "cramping" to RLQ. Pt sts she had diarrhea last night and this morning. Pt reports drinking etoh last week a "little more than normal". Pt sts she has h/o gerd.

## 2012-03-27 NOTE — Discharge Instructions (Signed)
Your evaluation today did not demonstrate a specific acute cause of your complaints.  However, your presentation is most consistent with a viral infection.  As discussed if any thing changes, such as fever, persistent vomiting, new pain in your lower abdomen, or anything at all, please return to the emergency department for additional evaluation.  Otherwise, please make sure to follow up with your primary care physician and your gastroenterologist. As discussed, you have been treated for a urinary tract infection, but it is important that you obtain the culture results to ensure that this is appropriate therapy.

## 2012-03-27 NOTE — ED Notes (Signed)
Patient transported to Ultrasound 

## 2012-03-27 NOTE — ED Provider Notes (Signed)
History     CSN: [id number]  Arrival date & time 03/27/12  4098   First MD Initiated Contact with Patient 03/27/12 0915      Chief Complaint  Patient presents with  . Abdominal Cramping  . Emesis    (Consider location/radiation/quality/duration/timing/severity/associated sxs/prior treatment) HPI The patient presents with concerns of abdominal pain, nausea, vomiting, diarrhea.  She notes that she was in her usual state of health until 6 days ago.  About that time she gradually developed pain focally in her epigastrium and right upper quadrant.  Since onset the pain has been persistent, though with waxing and waning severity.  Approximately 7 hours ago the pain became more severe, more persistent.  She notes that since that time she has had increasing nausea and had episodes of both vomiting and diarrhea.  She denies concurrent fever, chills, confusion, chest pain, dyspnea, other abdominal pain, vaginal complaints.  No chest pain relief thus far.  No clear alleviating or exacerbating factors.  No history of abdominal surgery. Past Medical History  Diagnosis Date  . Hypertension   . Migraines   . Anxiety   . Anxiety     Past Surgical History  Procedure Date  . Abdominal hysterectomy   . Gastrectomy     No family history on file.  History  Substance Use Topics  . Smoking status: Current Everyday Smoker -- 0.5 packs/day    Types: Cigarettes  . Smokeless tobacco: Never Used  . Alcohol Use: Yes     occasional    OB History    Grav Para Term Preterm Abortions TAB SAB Ect Mult Living                  Review of Systems  Constitutional:       HPI  HENT:       HPI otherwise negative  Eyes: Negative.   Respiratory:       HPI, otherwise negative  Cardiovascular:       HPI, otherwise nmegative  Gastrointestinal: Positive for vomiting and diarrhea.  Genitourinary:       HPI, otherwise negative  Musculoskeletal:       HPI, otherwise negative  Skin: Negative.     Neurological: Negative for syncope.    Allergies  Adhesive; Meperidine hcl; Percocet; and Septra  Home Medications   Current Outpatient Rx  Name Route Sig Dispense Refill  . ALPRAZOLAM 0.25 MG PO TABS Oral Take 0.125 mg by mouth once as needed. For anxiety    . AMLODIPINE-OLMESARTAN 5-20 MG PO TABS Oral Take 1 tablet by mouth daily.      . OMEGA-3 FATTY ACIDS 1000 MG PO CAPS Oral Take 1 g by mouth 3 (three) times daily.    . MULTI-VITAMIN/MINERALS PO TABS Oral Take 1 tablet by mouth daily.      Marland Kitchen RABEPRAZOLE SODIUM 20 MG PO TBEC Oral Take 20 mg by mouth daily.        BP 151/104  Pulse 96  Temp(Src) 98 F (36.7 C) (Oral)  Resp 16  SpO2 100%  Physical Exam  Nursing note and vitals reviewed. Constitutional: She is oriented to person, place, and time. She appears well-developed and well-nourished. No distress.  HENT:  Head: Normocephalic and atraumatic.  Eyes: Conjunctivae and EOM are normal.  Cardiovascular: Normal rate and regular rhythm.   Pulmonary/Chest: Effort normal and breath sounds normal. No stridor. No respiratory distress.  Abdominal: Soft. Normal appearance. She exhibits no distension. There is tenderness in the right upper  quadrant. There is positive Murphy's sign. There is no tenderness at McBurney's point.  Musculoskeletal: She exhibits no edema.  Neurological: She is alert and oriented to person, place, and time. No cranial nerve deficit.  Skin: Skin is warm and dry.  Psychiatric: She has a normal mood and affect.    ED Course  Procedures (including critical care time)  Labs Reviewed  URINALYSIS, ROUTINE W REFLEX MICROSCOPIC - Abnormal; Notable for the following:    Color, Urine AMBER (*) BIOCHEMICALS MAY BE AFFECTED BY COLOR   APPearance CLOUDY (*)    Hgb urine dipstick SMALL (*)    Bilirubin Urine SMALL (*)    All other components within normal limits  URINE MICROSCOPIC-ADD ON - Abnormal; Notable for the following:    Squamous Epithelial / LPF FEW  (*)    Bacteria, UA MANY (*)    All other components within normal limits  COMPREHENSIVE METABOLIC PANEL  CBC  DIFFERENTIAL  LIPASE, BLOOD  PREGNANCY, URINE   No results found.   No diagnosis found.  Patient asked to stop smoking  10:48 AM Patient notes that she feels better MDM  This 46 year old female now presents with 80s of abdominal pain, nausea, vomiting, diarrhea.  On exam she is in no distress, with vital signs notable for mild hypertension.  She has mild tenderness to palpation about the right upper quadrant.  Given the patient's description of nausea vomiting diarrhea and persistent pain, there is concern for biliary colic versus cholecystitis.  Appendicitis is also considered, but absent any focal lower quadrant pain, or notable progression of disease, fever, anorexia, this is less likely.  The patient's labs are notable for the absence of leukocytosis, and faint abnormalities on her urinalysis.  The patient's ultrasound is nausea acute pathology.  A review of the patient's prior charts notes similar faint laboratory abnormalities with multiple prior presentations for somewhat similar complaints.  On repeat evaluation the patient felt better, notes that she's had multiple similar episodes, and has been followed by gastroenterology.  Given the patient's improvement, the absence of distress, she is appropriate for continued management as an outpatient.  I discussed return precautions with the patient, specifically although appendicitis is unlikely, and this is a possibility.  Given the patient's abnormal urinalysis, she will be treated with antibiotics, pending a urine culture.  She was made aware of the need to obtain that result via her physician.  She will also follow up with gastroenterology.        Gerhard Munch, MD 03/27/12 1050

## 2012-03-28 LAB — URINE CULTURE
Colony Count: NO GROWTH
Culture  Setup Time: 201306111704
Culture: NO GROWTH
Special Requests: NORMAL

## 2012-09-23 ENCOUNTER — Emergency Department (HOSPITAL_BASED_OUTPATIENT_CLINIC_OR_DEPARTMENT_OTHER): Payer: Managed Care, Other (non HMO)

## 2012-09-23 ENCOUNTER — Emergency Department (HOSPITAL_BASED_OUTPATIENT_CLINIC_OR_DEPARTMENT_OTHER)
Admission: EM | Admit: 2012-09-23 | Discharge: 2012-09-24 | Disposition: A | Discharge status: Home / Self Care | Payer: Managed Care, Other (non HMO) | Attending: Emergency Medicine | Admitting: Emergency Medicine

## 2012-09-23 ENCOUNTER — Encounter (HOSPITAL_BASED_OUTPATIENT_CLINIC_OR_DEPARTMENT_OTHER): Payer: Self-pay | Admitting: *Deleted

## 2012-09-23 DIAGNOSIS — I10 Essential (primary) hypertension: Secondary | ICD-10-CM | POA: Insufficient documentation

## 2012-09-23 DIAGNOSIS — K219 Gastro-esophageal reflux disease without esophagitis: Secondary | ICD-10-CM | POA: Insufficient documentation

## 2012-09-23 DIAGNOSIS — F17200 Nicotine dependence, unspecified, uncomplicated: Secondary | ICD-10-CM | POA: Insufficient documentation

## 2012-09-23 DIAGNOSIS — F411 Generalized anxiety disorder: Secondary | ICD-10-CM | POA: Insufficient documentation

## 2012-09-23 DIAGNOSIS — G8929 Other chronic pain: Secondary | ICD-10-CM | POA: Insufficient documentation

## 2012-09-23 DIAGNOSIS — K273 Acute peptic ulcer, site unspecified, without hemorrhage or perforation: Secondary | ICD-10-CM | POA: Insufficient documentation

## 2012-09-23 DIAGNOSIS — R1013 Epigastric pain: Secondary | ICD-10-CM | POA: Insufficient documentation

## 2012-09-23 DIAGNOSIS — G43909 Migraine, unspecified, not intractable, without status migrainosus: Secondary | ICD-10-CM | POA: Insufficient documentation

## 2012-09-23 DIAGNOSIS — Z79899 Other long term (current) drug therapy: Secondary | ICD-10-CM | POA: Insufficient documentation

## 2012-09-23 HISTORY — DX: Gastro-esophageal reflux disease without esophagitis: K21.9

## 2012-09-23 LAB — URINALYSIS, ROUTINE W REFLEX MICROSCOPIC
Bilirubin Urine: NEGATIVE
Ketones, ur: NEGATIVE mg/dL
Nitrite: NEGATIVE
Specific Gravity, Urine: 1.018 (ref 1.005–1.030)
pH: 6 (ref 5.0–8.0)

## 2012-09-23 LAB — COMPREHENSIVE METABOLIC PANEL
AST: 18 U/L (ref 0–37)
Albumin: 3.5 g/dL (ref 3.5–5.2)
Alkaline Phosphatase: 35 U/L — ABNORMAL LOW (ref 39–117)
Chloride: 102 mEq/L (ref 96–112)
Potassium: 3.4 mEq/L — ABNORMAL LOW (ref 3.5–5.1)
Total Bilirubin: 0.3 mg/dL (ref 0.3–1.2)

## 2012-09-23 LAB — CBC WITH DIFFERENTIAL/PLATELET
Basophils Absolute: 0 10*3/uL (ref 0.0–0.1)
Basophils Relative: 0 % (ref 0–1)
Eosinophils Relative: 1 % (ref 0–5)
HCT: 34.7 % — ABNORMAL LOW (ref 36.0–46.0)
Hemoglobin: 12.1 g/dL (ref 12.0–15.0)
Lymphocytes Relative: 43 % (ref 12–46)
Lymphs Abs: 2 10*3/uL (ref 0.7–4.0)
MCH: 31.7 pg (ref 26.0–34.0)
MCV: 90.8 fL (ref 78.0–100.0)
Monocytes Absolute: 0.2 10*3/uL (ref 0.1–1.0)
Monocytes Relative: 5 % (ref 3–12)
RBC: 3.82 MIL/uL — ABNORMAL LOW (ref 3.87–5.11)
WBC: 4.8 10*3/uL (ref 4.0–10.5)

## 2012-09-23 MED ORDER — GI COCKTAIL ~~LOC~~
30.0000 mL | Freq: Once | ORAL | Status: AC
  Administered 2012-09-23: 30 mL via ORAL

## 2012-09-23 MED ORDER — HYDROCODONE-ACETAMINOPHEN 5-325 MG PO TABS: 2.0000 | ORAL_TABLET | ORAL | Status: DC | PRN

## 2012-09-23 MED ORDER — GI COCKTAIL ~~LOC~~
ORAL | Status: AC
  Filled 2012-09-23: qty 30

## 2012-09-23 MED ORDER — DICYCLOMINE HCL 20 MG PO TABS: 20.0000 mg | ORAL_TABLET | Freq: Two times a day (BID) | ORAL | Status: DC

## 2012-09-23 NOTE — ED Provider Notes (Signed)
History     CSN: [id number]  Arrival date & time 09/23/12  1749   First MD Initiated Contact with Patient 09/23/12 2007      Chief Complaint  Patient presents with  . Abdominal Pain    (Consider location/radiation/quality/duration/timing/severity/associated sxs/prior treatment) Patient is a 46 y.o. female presenting with abdominal pain. The history is provided by the patient. No language interpreter was used.  Abdominal Pain The primary symptoms of the illness include abdominal pain. Episode onset: 7 months. The onset of the illness was gradual. The problem has been gradually worsening.  Associated with: reflux. The patient states that she believes she is currently not pregnant. Risk factors for an acute abdominal problem include a history of abdominal surgery. Significant associated medical issues include PUD and GERD.  Pt is schedule to have a endoscopy next month.  Pt complains of recent increased bloating and pain  Past Medical History  Diagnosis Date  . Hypertension   . Migraines   . Anxiety   . Anxiety   . Acid reflux     Past Surgical History  Procedure Date  . Abdominal hysterectomy   . Gastrectomy     No family history on file.  History  Substance Use Topics  . Smoking status: Current Every Day Smoker -- 0.5 packs/day    Types: Cigarettes  . Smokeless tobacco: Never Used  . Alcohol Use: 4.2 oz/week    7 Cans of beer per week    OB History    Grav Para Term Preterm Abortions TAB SAB Ect Mult Living                  Review of Systems  Gastrointestinal: Positive for abdominal pain.  All other systems reviewed and are negative.    Allergies  Adhesive; Meperidine hcl; Percocet; and Septra  Home Medications   Current Outpatient Rx  Name  Route  Sig  Dispense  Refill  . ALPRAZOLAM 0.25 MG PO TABS   Oral   Take 0.125 mg by mouth once as needed. For anxiety         . AMLODIPINE-OLMESARTAN 5-20 MG PO TABS   Oral   Take 1 tablet by mouth daily.            Marland Kitchen CLONAZEPAM 0.5 MG PO TABS   Oral   Take 0.5 mg by mouth 2 (two) times daily as needed.         . OMEGA-3 FATTY ACIDS 1000 MG PO CAPS   Oral   Take 1 g by mouth 3 (three) times daily.         Marland Kitchen HYOSCYAMINE SULFATE ER 0.375 MG PO TB12   Oral   Take 0.375 mg by mouth every 12 (twelve) hours as needed.         . MULTI-VITAMIN/MINERALS PO TABS   Oral   Take 1 tablet by mouth daily.           Marland Kitchen RABEPRAZOLE SODIUM 20 MG PO TBEC   Oral   Take 20 mg by mouth daily.           Marland Kitchen METOCLOPRAMIDE HCL 10 MG PO TABS   Oral   Take 1 tablet (10 mg total) by mouth every 6 (six) hours.   15 tablet   0     BP 139/94  Pulse 98  Temp 98.2 F (36.8 C) (Oral)  Resp 16  SpO2 100%  Physical Exam  Nursing note and vitals reviewed. Constitutional: She is oriented  to person, place, and time. She appears well-developed and well-nourished.  HENT:  Head: Normocephalic and atraumatic.  Right Ear: External ear normal.  Left Ear: External ear normal.  Nose: Nose normal.  Mouth/Throat: Oropharynx is clear and moist.  Eyes: Conjunctivae normal are normal. Pupils are equal, round, and reactive to light.  Neck: Normal range of motion. Neck supple.  Cardiovascular: Normal rate.   Pulmonary/Chest: Effort normal and breath sounds normal.  Abdominal: Soft. There is tenderness.  Musculoskeletal: Normal range of motion.  Neurological: She is alert and oriented to person, place, and time.  Skin: Skin is warm.  Psychiatric: She has a normal mood and affect.    ED Course  Procedures (including critical care time)  Labs Reviewed  URINALYSIS, ROUTINE W REFLEX MICROSCOPIC - Abnormal; Notable for the following:    Hgb urine dipstick SMALL (*)     All other components within normal limits  CBC WITH DIFFERENTIAL - Abnormal; Notable for the following:    RBC 3.82 (*)     HCT 34.7 (*)     All other components within normal limits  COMPREHENSIVE METABOLIC PANEL - Abnormal; Notable  for the following:    Potassium 3.4 (*)     Glucose, Bld 168 (*)     Alkaline Phosphatase 35 (*)     All other components within normal limits  URINE MICROSCOPIC-ADD ON  LIPASE, BLOOD   Dg Abd Acute W/chest  09/23/2012  *RADIOLOGY REPORT*  Clinical Data: Abdominal pain, chest tightness.  ACUTE ABDOMEN SERIES (ABDOMEN 2 VIEW & CHEST 1 VIEW)  Comparison: Chest 08/19/2011.  Abdomen 12/04/2010.  Findings: Normal heart size and pulmonary vascularity.  No focal airspace consolidation in the lungs.  No blunting of costophrenic angles.  No pneumothorax.  Mediastinal contours appear intact.  Normal bowel gas pattern.  Scattered gas and stool in the colon. No small or large bowel distension.  No free intra-abdominal air. No abnormal air fluid levels.  No radiopaque stones.  IMPRESSION: No evidence of active pulmonary disease.  Nonobstructive bowel gas pattern.  No significant change since previous study.   Original Report Authenticated By: Burman Nieves, M.D.      No diagnosis found.    MDM   Results for orders placed during the hospital encounter of 09/23/12  URINALYSIS, ROUTINE W REFLEX MICROSCOPIC      Component Value Range   Color, Urine YELLOW  YELLOW   APPearance CLEAR  CLEAR   Specific Gravity, Urine 1.018  1.005 - 1.030   pH 6.0  5.0 - 8.0   Glucose, UA NEGATIVE  NEGATIVE mg/dL   Hgb urine dipstick SMALL (*) NEGATIVE   Bilirubin Urine NEGATIVE  NEGATIVE   Ketones, ur NEGATIVE  NEGATIVE mg/dL   Protein, ur NEGATIVE  NEGATIVE mg/dL   Urobilinogen, UA 1.0  0.0 - 1.0 mg/dL   Nitrite NEGATIVE  NEGATIVE   Leukocytes, UA NEGATIVE  NEGATIVE  URINE MICROSCOPIC-ADD ON      Component Value Range   RBC / HPF 0-2  <3 RBC/hpf  CBC WITH DIFFERENTIAL      Component Value Range   WBC 4.8  4.0 - 10.5 K/uL   RBC 3.82 (*) 3.87 - 5.11 MIL/uL   Hemoglobin 12.1  12.0 - 15.0 g/dL   HCT 16.1 (*) 09.6 - 04.5 %   MCV 90.8  78.0 - 100.0 fL   MCH 31.7  26.0 - 34.0 pg   MCHC 34.9  30.0 - 36.0 g/dL    RDW 12.6  11.5 - 15.5 %   Platelets 164  150 - 400 K/uL   Neutrophils Relative 51  43 - 77 %   Neutro Abs 2.4  1.7 - 7.7 K/uL   Lymphocytes Relative 43  12 - 46 %   Lymphs Abs 2.0  0.7 - 4.0 K/uL   Monocytes Relative 5  3 - 12 %   Monocytes Absolute 0.2  0.1 - 1.0 K/uL   Eosinophils Relative 1  0 - 5 %   Eosinophils Absolute 0.1  0.0 - 0.7 K/uL   Basophils Relative 0  0 - 1 %   Basophils Absolute 0.0  0.0 - 0.1 K/uL  COMPREHENSIVE METABOLIC PANEL      Component Value Range   Sodium 136  135 - 145 mEq/L   Potassium 3.4 (*) 3.5 - 5.1 mEq/L   Chloride 102  96 - 112 mEq/L   CO2 24  19 - 32 mEq/L   Glucose, Bld 168 (*) 70 - 99 mg/dL   BUN 15  6 - 23 mg/dL   Creatinine, Ser 2.13  0.50 - 1.10 mg/dL   Calcium 9.5  8.4 - 08.6 mg/dL   Total Protein 6.9  6.0 - 8.3 g/dL   Albumin 3.5  3.5 - 5.2 g/dL   AST 18  0 - 37 U/L   ALT 15  0 - 35 U/L   Alkaline Phosphatase 35 (*) 39 - 117 U/L   Total Bilirubin 0.3  0.3 - 1.2 mg/dL   GFR calc non Af Amer >90  >90 mL/min   GFR calc Af Amer >90  >90 mL/min  LIPASE, BLOOD      Component Value Range   Lipase 39  11 - 59 U/L   Dg Abd Acute W/chest  09/23/2012  *RADIOLOGY REPORT*  Clinical Data: Abdominal pain, chest tightness.  ACUTE ABDOMEN SERIES (ABDOMEN 2 VIEW & CHEST 1 VIEW)  Comparison: Chest 08/19/2011.  Abdomen 12/04/2010.  Findings: Normal heart size and pulmonary vascularity.  No focal airspace consolidation in the lungs.  No blunting of costophrenic angles.  No pneumothorax.  Mediastinal contours appear intact.  Normal bowel gas pattern.  Scattered gas and stool in the colon. No small or large bowel distension.  No free intra-abdominal air. No abnormal air fluid levels.  No radiopaque stones.  IMPRESSION: No evidence of active pulmonary disease.  Nonobstructive bowel gas pattern.  No significant change since previous study.   Original Report Authenticated By: Burman Nieves, M.D.        Pt given rx for bentyl and hydrocodone for pain.    Follow up with your gi doctor as scheduled    Lonia Skinner [place], [place] 09/23/12 2339

## 2012-09-23 NOTE — ED Notes (Signed)
Pt reports abd pain after eating with bloating for over 7 months- pain worse over last week

## 2012-09-25 NOTE — ED Provider Notes (Signed)
Medical screening examination/treatment/procedure(s) were performed by non-physician practitioner and as supervising physician I was immediately available for consultation/collaboration.   Loren Racer, MD 09/25/12 1840

## 2013-01-07 ENCOUNTER — Emergency Department (HOSPITAL_BASED_OUTPATIENT_CLINIC_OR_DEPARTMENT_OTHER): Payer: Managed Care, Other (non HMO)

## 2013-01-07 ENCOUNTER — Emergency Department (HOSPITAL_BASED_OUTPATIENT_CLINIC_OR_DEPARTMENT_OTHER)
Admission: EM | Admit: 2013-01-07 | Discharge: 2013-01-07 | Disposition: A | Discharge status: Home / Self Care | Payer: Managed Care, Other (non HMO) | Attending: Emergency Medicine | Admitting: Emergency Medicine

## 2013-01-07 ENCOUNTER — Encounter (HOSPITAL_BASED_OUTPATIENT_CLINIC_OR_DEPARTMENT_OTHER): Payer: Self-pay

## 2013-01-07 DIAGNOSIS — Z79899 Other long term (current) drug therapy: Secondary | ICD-10-CM | POA: Insufficient documentation

## 2013-01-07 DIAGNOSIS — F17200 Nicotine dependence, unspecified, uncomplicated: Secondary | ICD-10-CM | POA: Insufficient documentation

## 2013-01-07 DIAGNOSIS — F411 Generalized anxiety disorder: Secondary | ICD-10-CM | POA: Insufficient documentation

## 2013-01-07 DIAGNOSIS — K219 Gastro-esophageal reflux disease without esophagitis: Secondary | ICD-10-CM | POA: Insufficient documentation

## 2013-01-07 DIAGNOSIS — R0602 Shortness of breath: Secondary | ICD-10-CM | POA: Insufficient documentation

## 2013-01-07 DIAGNOSIS — I10 Essential (primary) hypertension: Secondary | ICD-10-CM | POA: Insufficient documentation

## 2013-01-07 DIAGNOSIS — Z8719 Personal history of other diseases of the digestive system: Secondary | ICD-10-CM | POA: Insufficient documentation

## 2013-01-07 DIAGNOSIS — Z8679 Personal history of other diseases of the circulatory system: Secondary | ICD-10-CM | POA: Insufficient documentation

## 2013-01-07 DIAGNOSIS — R079 Chest pain, unspecified: Secondary | ICD-10-CM | POA: Insufficient documentation

## 2013-01-07 HISTORY — DX: Irritable bowel syndrome, unspecified: K58.9

## 2013-01-07 LAB — CBC
HCT: 36.3 % (ref 36.0–46.0)
Hemoglobin: 12.5 g/dL (ref 12.0–15.0)
MCV: 92.4 fL (ref 78.0–100.0)
RDW: 12.3 % (ref 11.5–15.5)
WBC: 4.9 10*3/uL (ref 4.0–10.5)

## 2013-01-07 LAB — BASIC METABOLIC PANEL
BUN: 13 mg/dL (ref 6–23)
Chloride: 102 mEq/L (ref 96–112)
Creatinine, Ser: 0.8 mg/dL (ref 0.50–1.10)
GFR calc Af Amer: >90 mL/min (ref >90)
Glucose, Bld: 107 mg/dL — ABNORMAL HIGH (ref 70–99)

## 2013-01-07 MED ORDER — ASPIRIN 325 MG PO TABS
325.0000 mg | ORAL_TABLET | ORAL | Status: AC
  Administered 2013-01-07: 325 mg via ORAL
  Filled 2013-01-07: qty 1

## 2013-01-07 MED ORDER — PROMETHAZINE HCL 25 MG PO TABS: 25.0000 mg | ORAL_TABLET | Freq: Four times a day (QID) | ORAL | Status: DC | PRN

## 2013-01-07 MED ORDER — NITROGLYCERIN 0.4 MG SL SUBL
0.4000 mg | SUBLINGUAL_TABLET | SUBLINGUAL | Status: AC | PRN
  Administered 2013-01-07 (×3): 0.4 mg via SUBLINGUAL
  Filled 2013-01-07: qty 25

## 2013-01-07 MED ORDER — HYDROCODONE-ACETAMINOPHEN 5-500 MG PO TABS: 1.0000 | ORAL_TABLET | Freq: Four times a day (QID) | ORAL | Status: DC | PRN

## 2013-01-07 NOTE — ED Notes (Signed)
Patient transported to X-ray 

## 2013-01-07 NOTE — ED Notes (Signed)
PA at bedside.

## 2013-01-07 NOTE — ED Provider Notes (Signed)
Date: 01/07/2013  Rate: 106  Rhythm: sinus tachycardia  QRS Axis: normal  Intervals: normal  ST/T Wave abnormalities: normal  Conduction Disutrbances:none  Narrative Interpretation: Sinus tachycardia, left atrial hypertrophy. When compared with ECG of 08/19/2011, no significant changes are seen  Old EKG Reviewed: unchanged  Medical screening examination/treatment/procedure(s) were performed by non-physician practitioner and as supervising physician I was immediately available for consultation/collaboration.   Dione Booze, MD 01/07/13 [phone]

## 2013-01-07 NOTE — ED Provider Notes (Signed)
History     CSN: [id number]  Arrival date & time 01/07/13  1330   First MD Initiated Contact with Patient 01/07/13 1426      Chief Complaint  Patient presents with  . Chest Pain    (Consider location/radiation/quality/duration/timing/severity/associated sxs/prior treatment) HPI  Natasha Hickman is a 47 y.o. female c/o substernal chest pain, radiating to back and neck, described as pressure-like, rated at 7/10 at worst, 7/10 now.  Pain started x7 days ago. Pain has been intermittent, non-exertional, non-pleuritic or positional. Pain is associated with SOB. Denies  N/V, diaphoresis, cough, fever, back pain, syncope, prior episodes, recent cocaine/methamphetimine use. Denies h/o DVT, PE,  recent travel, leg swelling, hemoptysis, exogenous estrogen.  Pt has not received any ASA or NTG PTA.  RF: Hypertension, smoker Cath: ?  Last Stress test: 1 year ago normal as per Pt Cardiologost: [facility] at [facility]   Past Medical History  Diagnosis Date  . Hypertension   . Migraines   . Anxiety   . Anxiety   . Acid reflux   . IBS (irritable bowel syndrome)     Past Surgical History  Procedure Laterality Date  . Abdominal hysterectomy    . Gastrectomy      History reviewed. No pertinent family history.  History  Substance Use Topics  . Smoking status: Current Every Day Smoker -- 0.50 packs/day    Types: Cigarettes  . Smokeless tobacco: Never Used  . Alcohol Use: 4.2 oz/week    7 Cans of beer per week    OB History   Grav Para Term Preterm Abortions TAB SAB Ect Mult Living                  Review of Systems  Constitutional: Negative for fever.  Respiratory: Positive for shortness of breath.   Cardiovascular: Positive for chest pain.  Gastrointestinal: Negative for nausea, vomiting, abdominal pain and diarrhea.  All other systems reviewed and are negative.    Allergies  Adhesive; Meperidine hcl; Percocet; and Septra  Home Medications   Current Outpatient Rx  Name   Route  Sig  Dispense  Refill  . amLODipine-olmesartan (AZOR) 5-20 MG per tablet   Oral   Take 1 tablet by mouth daily.           . cefdinir (OMNICEF) 300 MG capsule   Oral   Take 300 mg by mouth 2 (two) times daily.         . clonazePAM (KLONOPIN) 0.5 MG tablet   Oral   Take 0.5 mg by mouth 2 (two) times daily as needed.         . dicyclomine (BENTYL) 20 MG tablet   Oral   Take 1 tablet (20 mg total) by mouth 2 (two) times daily.   20 tablet   0   . RABEprazole (ACIPHEX) 20 MG tablet   Oral   Take 20 mg by mouth daily.           Marland Kitchen ALPRAZolam (XANAX) 0.25 MG tablet   Oral   Take 0.125 mg by mouth once as needed. For anxiety         . fish oil-omega-3 fatty acids 1000 MG capsule   Oral   Take 1 g by mouth 3 (three) times daily.         Marland Kitchen HYDROcodone-acetaminophen (NORCO/VICODIN) 5-325 MG per tablet   Oral   Take 2 tablets by mouth every 4 (four) hours as needed for pain.   10 tablet  0   . hyoscyamine (LEVBID) 0.375 MG 12 hr tablet   Oral   Take 0.375 mg by mouth every 12 (twelve) hours as needed.         Marland Kitchen EXPIRED: metoCLOPramide (REGLAN) 10 MG tablet   Oral   Take 1 tablet (10 mg total) by mouth every 6 (six) hours.   15 tablet   0   . Multiple Vitamins-Minerals (MULTIVITAMIN WITH MINERALS) tablet   Oral   Take 1 tablet by mouth daily.             BP 138/90  Pulse 115  Temp(Src) 99.1 F (37.3 C) (Oral)  Resp 16  Ht 5\' 2"  (1.575 m)  Wt 125 lb (56.7 kg)  BMI 22.86 kg/m2  SpO2 100%  Physical Exam  Nursing note and vitals reviewed. Constitutional: She is oriented to person, place, and time. She appears well-developed and well-nourished. No distress.  HENT:  Head: Normocephalic and atraumatic.  Mouth/Throat: Oropharynx is clear and moist.  Eyes: Conjunctivae and EOM are normal. Pupils are equal, round, and reactive to light.  Neck: Normal range of motion. Neck supple. No JVD present.  Cardiovascular: Regular rhythm and intact distal  pulses.  Exam reveals friction rub. Exam reveals no gallop.   No murmur heard. Tachycardia at 106  Pulmonary/Chest: Effort normal and breath sounds normal. No stridor. No respiratory distress. She has no wheezes. She has no rales. She exhibits no tenderness.  Abdominal: Soft. Bowel sounds are normal. She exhibits no distension and no mass. There is no tenderness. There is no rebound and no guarding.  Musculoskeletal: Normal range of motion. She exhibits no edema.  No calf asymmetry, superficial collaterals, palpable cords, Homans sign negative  Lymphadenopathy:    She has no cervical adenopathy.  Neurological: She is alert and oriented to person, place, and time.  Skin: Skin is warm.  Psychiatric: She has a normal mood and affect.    ED Course  Procedures (including critical care time)  Labs Reviewed  BASIC METABOLIC PANEL - Abnormal; Notable for the following:    Glucose, Bld 107 (*)    GFR calc non Af Amer 88 (*)    All other components within normal limits  CBC  TROPONIN I  D-DIMER, QUANTITATIVE  TROPONIN I   Dg Chest 2 View  01/07/2013  *RADIOLOGY REPORT*  Clinical Data: Chest pain radiating to back for 1 week, history hypertension, GERD  CHEST - 2 VIEW  Comparison: 09/23/2012  Findings: Normal heart size, mediastinal contours, and pulmonary vascularity. Lungs clear. No pleural effusion or pneumothorax. Minimal peribronchial thickening. Bones unremarkable.  IMPRESSION: Minimal bronchitic changes.   Original Report Authenticated By: Ulyses Southward, M.D.      1. Chest pain       MDM   Natasha Hickman is a 47 y.o. female with intermittent chest pressure for one week. EKG is nonischemic (see Dr. Reynolds Bowl note) delta troponins are negative. Chest x-ray shows mild bronchitic changes. PERC rule could not be used as patient is mildly tachycardic, d-dimer is negative.  Patient's pain is significantly improved with IV medications. Tachycardia is resolved. I discussed this with attending  who agrees with care plan and stability for discharge.  Patient has excellent outpatient followup and can't be with her cardiologist in the next several days. I've advised patient and the very low threshold to return to the ED for any worsening or concerning symptoms. Patient has verbalized her understanding.   Filed Vitals:   01/07/13 1409 01/07/13  1535 01/07/13 1543 01/07/13 1550  BP:  123/84 111/78 113/73  Pulse:  91 97 100  Temp:      TempSrc:      Resp: 16 16 16 16   Height:      Weight:      SpO2: 100%  99%      Pt verbalized understanding and agrees with care plan. Outpatient follow-up and return precautions given.    New Prescriptions   HYDROCODONE-ACETAMINOPHEN (VICODIN) 5-500 MG PER TABLET    Take 1-2 tablets by mouth every 6 (six) hours as needed for pain.   PROMETHAZINE (PHENERGAN) 25 MG TABLET    Take 1 tablet (25 mg total) by mouth every 6 (six) hours as needed for nausea.            Wynetta Emery, PA-C 01/08/13 [phone]

## 2013-01-07 NOTE — ED Notes (Signed)
Pt states that she has had chest pain x1 week pt states that she has been seen by cardiology and was given a stress test which was "good" but is to have FU echo performed.  Pt states that she cp radiates to the back, hx of gerd, IBS.  Pt states that she is followed by Dr Hanley Hays for cardiology.

## 2013-03-07 ENCOUNTER — Emergency Department (HOSPITAL_BASED_OUTPATIENT_CLINIC_OR_DEPARTMENT_OTHER): Payer: Managed Care, Other (non HMO)

## 2013-03-07 ENCOUNTER — Encounter (HOSPITAL_BASED_OUTPATIENT_CLINIC_OR_DEPARTMENT_OTHER): Payer: Self-pay

## 2013-03-07 ENCOUNTER — Emergency Department (HOSPITAL_BASED_OUTPATIENT_CLINIC_OR_DEPARTMENT_OTHER)
Admission: EM | Admit: 2013-03-07 | Discharge: 2013-03-07 | Disposition: A | Discharge status: Home / Self Care | Payer: Managed Care, Other (non HMO) | Attending: Emergency Medicine | Admitting: Emergency Medicine

## 2013-03-07 DIAGNOSIS — G43909 Migraine, unspecified, not intractable, without status migrainosus: Secondary | ICD-10-CM | POA: Insufficient documentation

## 2013-03-07 DIAGNOSIS — R110 Nausea: Secondary | ICD-10-CM | POA: Insufficient documentation

## 2013-03-07 DIAGNOSIS — R51 Headache: Secondary | ICD-10-CM | POA: Insufficient documentation

## 2013-03-07 DIAGNOSIS — Z79899 Other long term (current) drug therapy: Secondary | ICD-10-CM | POA: Insufficient documentation

## 2013-03-07 DIAGNOSIS — I10 Essential (primary) hypertension: Secondary | ICD-10-CM | POA: Insufficient documentation

## 2013-03-07 DIAGNOSIS — Z8719 Personal history of other diseases of the digestive system: Secondary | ICD-10-CM | POA: Insufficient documentation

## 2013-03-07 DIAGNOSIS — F411 Generalized anxiety disorder: Secondary | ICD-10-CM | POA: Insufficient documentation

## 2013-03-07 DIAGNOSIS — F17200 Nicotine dependence, unspecified, uncomplicated: Secondary | ICD-10-CM | POA: Insufficient documentation

## 2013-03-07 DIAGNOSIS — H53149 Visual discomfort, unspecified: Secondary | ICD-10-CM | POA: Insufficient documentation

## 2013-03-07 DIAGNOSIS — H538 Other visual disturbances: Secondary | ICD-10-CM | POA: Insufficient documentation

## 2013-03-07 DIAGNOSIS — H539 Unspecified visual disturbance: Secondary | ICD-10-CM

## 2013-03-07 DIAGNOSIS — K219 Gastro-esophageal reflux disease without esophagitis: Secondary | ICD-10-CM | POA: Insufficient documentation

## 2013-03-07 MED ORDER — IBUPROFEN 600 MG PO TABS: 600.0000 mg | ORAL_TABLET | Freq: Four times a day (QID) | ORAL | Status: DC | PRN

## 2013-03-07 MED ORDER — TRAMADOL HCL 50 MG PO TABS
50.0000 mg | ORAL_TABLET | Freq: Once | ORAL | Status: AC
  Administered 2013-03-07: 50 mg via ORAL
  Filled 2013-03-07: qty 1

## 2013-03-07 NOTE — ED Provider Notes (Signed)
History     CSN: [id number]  Arrival date & time 03/07/13  1850   First MD Initiated Contact with Patient 03/07/13 1949      Chief Complaint  Patient presents with  . Headache    (Consider location/radiation/quality/duration/timing/severity/associated sxs/prior treatment) HPI Pt is a 46yo woman c/o headache.  States she was driving when all of a sudden her vision changed, "I couldn't see, I mean, I really had to just pull over and my head just started hurting really bad and my face started pulling.  Did not feel like a typical headache."  Pt states HA started 5-24min after change in vision.  Has had migraines in the past but has never had vision changes.  This headache also felt different.  Difficult for pt to describe, more sharp in nature and on top of her head, radiating to neck.  Has not had anything for pain.  Initially 10/10, now pain is 7/10.  Reports feeling well prior to HA.  No fever cough chills.  Denies trauma to head.   Past Medical History  Diagnosis Date  . Hypertension   . Migraines   . Anxiety   . Anxiety   . Acid reflux   . IBS (irritable bowel syndrome)     Past Surgical History  Procedure Laterality Date  . Abdominal hysterectomy    . Gastrectomy      No family history on file.  History  Substance Use Topics  . Smoking status: Current Every Day Smoker -- 0.50 packs/day    Types: Cigarettes  . Smokeless tobacco: Never Used  . Alcohol Use: 4.2 oz/week    7 Cans of beer per week    OB History   Grav Para Term Preterm Abortions TAB SAB Ect Mult Living                  Review of Systems  Constitutional: Negative for fever and chills.  Eyes: Positive for photophobia and visual disturbance.  Gastrointestinal: Positive for nausea.  Neurological: Positive for headaches.  All other systems reviewed and are negative.    Allergies  Adhesive; Meperidine hcl; Percocet; and Septra  Home Medications   Current Outpatient Rx  Name  Route  Sig   Dispense  Refill  . esomeprazole (NEXIUM) 40 MG capsule   Oral   Take 40 mg by mouth daily before breakfast.         . ALPRAZolam (XANAX) 0.25 MG tablet   Oral   Take 0.125 mg by mouth once as needed. For anxiety         . amLODipine-olmesartan (AZOR) 5-20 MG per tablet   Oral   Take 1 tablet by mouth daily.           . cefdinir (OMNICEF) 300 MG capsule   Oral   Take 300 mg by mouth 2 (two) times daily.         . clonazePAM (KLONOPIN) 0.5 MG tablet   Oral   Take 0.5 mg by mouth 2 (two) times daily as needed.         . dicyclomine (BENTYL) 20 MG tablet   Oral   Take 1 tablet (20 mg total) by mouth 2 (two) times daily.   20 tablet   0   . fish oil-omega-3 fatty acids 1000 MG capsule   Oral   Take 1 g by mouth 3 (three) times daily.         Marland Kitchen HYDROcodone-acetaminophen (NORCO/VICODIN) 5-325 MG per tablet  Oral   Take 2 tablets by mouth every 4 (four) hours as needed for pain.   10 tablet   0   . HYDROcodone-acetaminophen (VICODIN) 5-500 MG per tablet   Oral   Take 1-2 tablets by mouth every 6 (six) hours as needed for pain.   15 tablet   0   . hyoscyamine (LEVBID) 0.375 MG 12 hr tablet   Oral   Take 0.375 mg by mouth every 12 (twelve) hours as needed.         Marland Kitchen ibuprofen (ADVIL,MOTRIN) 600 MG tablet   Oral   Take 1 tablet (600 mg total) by mouth every 6 (six) hours as needed for pain.   30 tablet   0   . EXPIRED: metoCLOPramide (REGLAN) 10 MG tablet   Oral   Take 1 tablet (10 mg total) by mouth every 6 (six) hours.   15 tablet   0   . Multiple Vitamins-Minerals (MULTIVITAMIN WITH MINERALS) tablet   Oral   Take 1 tablet by mouth daily.           . promethazine (PHENERGAN) 25 MG tablet   Oral   Take 1 tablet (25 mg total) by mouth every 6 (six) hours as needed for nausea.   12 tablet   0   . RABEprazole (ACIPHEX) 20 MG tablet   Oral   Take 20 mg by mouth daily.             BP 137/92  Pulse 90  Temp(Src) 98.5 F (36.9 C) (Oral)   Resp 16  SpO2 100%  Physical Exam  Nursing note and vitals reviewed. Constitutional: She is oriented to person, place, and time. She appears well-developed and well-nourished. No distress.  Pt standing in doorway when i entered room, pt appears tired but NAD  HENT:  Head: Normocephalic and atraumatic.  Right Ear: External ear normal.  Left Ear: External ear normal.  Mouth/Throat: No oropharyngeal exudate.  Eyes: Conjunctivae and EOM are normal. Pupils are equal, round, and reactive to light. Right eye exhibits no discharge. Left eye exhibits no discharge. No scleral icterus.  Neck: Normal range of motion. Neck supple.  Cardiovascular: Normal rate, regular rhythm and normal heart sounds.   Pulmonary/Chest: Breath sounds normal. No respiratory distress. She has no wheezes. She has no rales. She exhibits no tenderness.  Musculoskeletal: Normal range of motion.  Neurological: She is alert and oriented to person, place, and time. She has normal strength. She displays no atrophy and no tremor. No cranial nerve deficit or sensory deficit. She exhibits normal muscle tone. She displays a negative Romberg sign. She displays no seizure activity. Coordination and gait normal. GCS eye subscore is 4. GCS verbal subscore is 5. GCS motor subscore is 6.  CN II-XII in tact, no focal deficit, GCS 15, nl sensation and strength, neg romberg, nl gait  Skin: Skin is warm and dry. She is not diaphoretic.  Psychiatric: She has a normal mood and affect. Her behavior is normal.    ED Course  Procedures (including critical care time)  Labs Reviewed - No data to display Ct Head Wo Contrast  03/07/2013   *RADIOLOGY REPORT*  Clinical Data: Severe headache, left eye vision blurriness  CT HEAD WITHOUT CONTRAST  Technique:  Contiguous axial images were obtained from the base of the skull through the vertex without contrast.  Comparison: 06/17/2011  Findings: No evidence of parenchymal hemorrhage or extra-axial fluid  collection. No mass lesion, mass effect, or midline shift.  No CT evidence of acute infarction.  Cerebral volume is age appropriate.  No ventriculomegaly.  The visualized paranasal sinuses are essentially clear. The mastoid air cells are unopacified.  No evidence of calvarial fracture.  IMPRESSION: Normal head CT.   Original Report Authenticated By: Charline Bills, M.D.     1. Headache   2. Change in vision       MDM  Pt presented after sudden onset headache sharp in nature. 5-28min prior to onset, pt states "i couldn't see"  Did not feel like typical headache.  Nl neuro exam: CN II-XII in tact, no focal deficit, nl sensation and strength, neg romberg, nl gait.  In ED Head CT and tramadol (pt was nervous stronger medication would make her sick)  Head CT: nl.  No signs of intracranial bleed or clot.  Likely an atypical migraine for pt.  Will discharge home.  F/u with PCP as needed for continued headaches.    Vitals: unremarkable. Discharged in stable condition.    Discussed pt with attending during ED encounter.     Junius Finner, PA-C 03/08/13 1111

## 2013-03-07 NOTE — ED Notes (Signed)
Pt states she was driving when all of sudden she had a sharp pain in her head and her vision changed, ""like I couldn't see, I mean, I really had to just pull over and my head just started hurting really bad and my face starting pulling. Did not feel like a typical headache."

## 2013-03-14 NOTE — ED Provider Notes (Signed)
Medical screening examination/treatment/procedure(s) were performed by non-physician practitioner and as supervising physician I was immediately available for consultation/collaboration.   Shelda Jakes, MD 03/14/13 [phone]

## 2013-06-28 ENCOUNTER — Encounter (HOSPITAL_BASED_OUTPATIENT_CLINIC_OR_DEPARTMENT_OTHER): Payer: Self-pay | Admitting: *Deleted

## 2013-06-28 ENCOUNTER — Emergency Department (HOSPITAL_BASED_OUTPATIENT_CLINIC_OR_DEPARTMENT_OTHER): Payer: Managed Care, Other (non HMO)

## 2013-06-28 ENCOUNTER — Emergency Department (HOSPITAL_BASED_OUTPATIENT_CLINIC_OR_DEPARTMENT_OTHER)
Admission: EM | Admit: 2013-06-28 | Discharge: 2013-06-28 | Disposition: A | Discharge status: Home / Self Care | Payer: Managed Care, Other (non HMO) | Attending: Emergency Medicine | Admitting: Emergency Medicine

## 2013-06-28 DIAGNOSIS — R1084 Generalized abdominal pain: Secondary | ICD-10-CM | POA: Insufficient documentation

## 2013-06-28 DIAGNOSIS — Z3202 Encounter for pregnancy test, result negative: Secondary | ICD-10-CM | POA: Insufficient documentation

## 2013-06-28 DIAGNOSIS — F17200 Nicotine dependence, unspecified, uncomplicated: Secondary | ICD-10-CM | POA: Insufficient documentation

## 2013-06-28 DIAGNOSIS — R197 Diarrhea, unspecified: Secondary | ICD-10-CM | POA: Insufficient documentation

## 2013-06-28 DIAGNOSIS — K589 Irritable bowel syndrome without diarrhea: Secondary | ICD-10-CM | POA: Insufficient documentation

## 2013-06-28 DIAGNOSIS — R109 Unspecified abdominal pain: Secondary | ICD-10-CM | POA: Diagnosis present

## 2013-06-28 DIAGNOSIS — F411 Generalized anxiety disorder: Secondary | ICD-10-CM | POA: Insufficient documentation

## 2013-06-28 DIAGNOSIS — G43909 Migraine, unspecified, not intractable, without status migrainosus: Secondary | ICD-10-CM | POA: Insufficient documentation

## 2013-06-28 DIAGNOSIS — Z79899 Other long term (current) drug therapy: Secondary | ICD-10-CM | POA: Insufficient documentation

## 2013-06-28 DIAGNOSIS — K219 Gastro-esophageal reflux disease without esophagitis: Secondary | ICD-10-CM | POA: Insufficient documentation

## 2013-06-28 DIAGNOSIS — I10 Essential (primary) hypertension: Secondary | ICD-10-CM | POA: Insufficient documentation

## 2013-06-28 LAB — CBC WITH DIFFERENTIAL/PLATELET
Basophils Absolute: 0 10*3/uL (ref 0.0–0.1)
Basophils Relative: 0 % (ref 0–1)
HCT: 37.1 % (ref 36.0–46.0)
MCHC: 33.4 g/dL (ref 30.0–36.0)
Monocytes Absolute: 0.3 10*3/uL (ref 0.1–1.0)
Neutro Abs: 2.3 10*3/uL (ref 1.7–7.7)
Neutrophils Relative %: 56 % (ref 43–77)
Platelets: 194 10*3/uL (ref 150–400)
RDW: 12.1 % (ref 11.5–15.5)
WBC: 4.1 10*3/uL (ref 4.0–10.5)

## 2013-06-28 LAB — COMPREHENSIVE METABOLIC PANEL
ALT: 20 U/L (ref 0–35)
AST: 20 U/L (ref 0–37)
Albumin: 3.8 g/dL (ref 3.5–5.2)
Calcium: 9.5 mg/dL (ref 8.4–10.5)
Chloride: 104 mEq/L (ref 96–112)
Creatinine, Ser: 0.7 mg/dL (ref 0.50–1.10)
Sodium: 139 mEq/L (ref 135–145)
Total Bilirubin: 0.3 mg/dL (ref 0.3–1.2)

## 2013-06-28 LAB — URINALYSIS, ROUTINE W REFLEX MICROSCOPIC
Ketones, ur: NEGATIVE mg/dL
Leukocytes, UA: NEGATIVE
Nitrite: NEGATIVE
Protein, ur: NEGATIVE mg/dL
Urobilinogen, UA: 1 mg/dL (ref 0.0–1.0)

## 2013-06-28 LAB — URINE MICROSCOPIC-ADD ON

## 2013-06-28 LAB — PREGNANCY, URINE: Preg Test, Ur: NEGATIVE

## 2013-06-28 MED ORDER — DIPHENHYDRAMINE HCL 50 MG/ML IJ SOLN
25.0000 mg | Freq: Once | INTRAMUSCULAR | Status: AC
  Administered 2013-06-28: 25 mg via INTRAVENOUS
  Filled 2013-06-28: qty 1

## 2013-06-28 MED ORDER — IOHEXOL 300 MG/ML  SOLN
50.0000 mL | Freq: Once | INTRAMUSCULAR | Status: AC | PRN
  Administered 2013-06-28: 50 mL via ORAL

## 2013-06-28 MED ORDER — ONDANSETRON HCL 4 MG/2ML IJ SOLN
4.0000 mg | Freq: Once | INTRAMUSCULAR | Status: AC
  Administered 2013-06-28: 4 mg via INTRAVENOUS
  Filled 2013-06-28: qty 2

## 2013-06-28 MED ORDER — HYDROMORPHONE HCL PF 1 MG/ML IJ SOLN
INTRAMUSCULAR | Status: AC
  Filled 2013-06-28: qty 1

## 2013-06-28 MED ORDER — IOHEXOL 300 MG/ML  SOLN
100.0000 mL | Freq: Once | INTRAMUSCULAR | Status: AC | PRN
  Administered 2013-06-28: 100 mL via INTRAVENOUS

## 2013-06-28 MED ORDER — SODIUM CHLORIDE 0.9 % IV BOLUS (SEPSIS)
1000.0000 mL | INTRAVENOUS | Status: AC
  Administered 2013-06-28: 1000 mL via INTRAVENOUS

## 2013-06-28 MED ORDER — HYDROCODONE-ACETAMINOPHEN 5-325 MG PO TABS: 2.0000 | ORAL_TABLET | Freq: Four times a day (QID) | ORAL | Status: DC | PRN

## 2013-06-28 MED ORDER — HYDROMORPHONE HCL PF 1 MG/ML IJ SOLN
1.0000 mg | INTRAMUSCULAR | Status: AC
Start: 2013-06-28 — End: 2013-06-28
  Administered 2013-06-28: 1 mg via INTRAVENOUS
  Filled 2013-06-28: qty 1

## 2013-06-28 MED ORDER — HYDROMORPHONE HCL PF 1 MG/ML IJ SOLN
1.0000 mg | Freq: Once | INTRAMUSCULAR | Status: AC
  Administered 2013-06-28: 1 mg via INTRAVENOUS

## 2013-06-28 NOTE — ED Notes (Signed)
Patient began itching and scratching after IV contrast injection.  Dr Romeo Apple was notified and was going to order some benadryl.  Terri was in room 1 with another patient, so I told Melissa and she said she would give the patient the benadryl.

## 2013-06-28 NOTE — ED Notes (Signed)
Patient states she has a two or three month history of upper abdominal pain.  States she has been diagnosed with acid reflux and sees a GI specialist for treatment with nexium.  States last night she developed increased pain in her stomach, with extreme nausea and diarrhea stools one time a day for the last two or three days.

## 2013-06-28 NOTE — ED Provider Notes (Signed)
CSN: [id number]     Arrival date & time 06/28/13  7829 History   First MD Initiated Contact with Patient 06/28/13 [phone]     Chief Complaint  Patient presents with  . Abdominal Pain   (Consider location/radiation/quality/duration/timing/severity/associated sxs/prior Treatment) Patient is a 47 y.o. female presenting with abdominal pain. The history is provided by the patient.  Abdominal Pain Pain location:  Generalized Pain quality: aching   Pain radiates to:  Does not radiate Pain severity:  Moderate Onset quality:  Gradual Duration:  1 day Timing:  Constant Progression:  Unchanged Chronicity:  Chronic Relieved by:  Nothing Worsened by:  Nothing tried Ineffective treatments:  OTC medications (dicyclomine) Associated symptoms: diarrhea   Associated symptoms: no chest pain, no cough, no dysuria, no fatigue, no fever, no hematuria, no nausea, no shortness of breath and no vomiting     Past Medical History  Diagnosis Date  . Hypertension   . Migraines   . Anxiety   . Anxiety   . Acid reflux   . IBS (irritable bowel syndrome)    Past Surgical History  Procedure Laterality Date  . Abdominal hysterectomy    . Gastrectomy    . Esophagogastric fundoplasty     No family history on file. History  Substance Use Topics  . Smoking status: Current Every Day Smoker -- 0.50 packs/day    Types: Cigarettes  . Smokeless tobacco: Never Used  . Alcohol Use: 1.2 oz/week    2 Cans of beer per week     Comment: daily   OB History   Grav Para Term Preterm Abortions TAB SAB Ect Mult Living                 Review of Systems  Constitutional: Negative for fever and fatigue.  HENT: Negative for congestion, drooling and neck pain.   Eyes: Negative for pain.  Respiratory: Negative for cough and shortness of breath.   Cardiovascular: Negative for chest pain.  Gastrointestinal: Positive for abdominal pain and diarrhea. Negative for nausea and vomiting.  Genitourinary: Negative for dysuria  and hematuria.  Musculoskeletal: Negative for back pain and gait problem.  Skin: Negative for color change.  Neurological: Negative for dizziness and headaches.  Hematological: Negative for adenopathy.  Psychiatric/Behavioral: Negative for behavioral problems.  All other systems reviewed and are negative.    Allergies  Adhesive; Meperidine hcl; Percocet; and Septra  Home Medications   Current Outpatient Rx  Name  Route  Sig  Dispense  Refill  . ALPRAZolam (XANAX) 0.25 MG tablet   Oral   Take 0.125 mg by mouth once as needed. For anxiety         . amLODipine-olmesartan (AZOR) 5-20 MG per tablet   Oral   Take 1 tablet by mouth daily.           . cefdinir (OMNICEF) 300 MG capsule   Oral   Take 300 mg by mouth 2 (two) times daily.         . clonazePAM (KLONOPIN) 0.5 MG tablet   Oral   Take 0.5 mg by mouth 2 (two) times daily as needed.         . dicyclomine (BENTYL) 20 MG tablet   Oral   Take 1 tablet (20 mg total) by mouth 2 (two) times daily.   20 tablet   0   . esomeprazole (NEXIUM) 40 MG capsule   Oral   Take 40 mg by mouth daily before breakfast.         .  fish oil-omega-3 fatty acids 1000 MG capsule   Oral   Take 1 g by mouth 3 (three) times daily.         Marland Kitchen HYDROcodone-acetaminophen (NORCO/VICODIN) 5-325 MG per tablet   Oral   Take 2 tablets by mouth every 4 (four) hours as needed for pain.   10 tablet   0   . HYDROcodone-acetaminophen (VICODIN) 5-500 MG per tablet   Oral   Take 1-2 tablets by mouth every 6 (six) hours as needed for pain.   15 tablet   0   . hyoscyamine (LEVBID) 0.375 MG 12 hr tablet   Oral   Take 0.375 mg by mouth every 12 (twelve) hours as needed.         Marland Kitchen ibuprofen (ADVIL,MOTRIN) 600 MG tablet   Oral   Take 1 tablet (600 mg total) by mouth every 6 (six) hours as needed for pain.   30 tablet   0   . EXPIRED: metoCLOPramide (REGLAN) 10 MG tablet   Oral   Take 1 tablet (10 mg total) by mouth every 6 (six)  hours.   15 tablet   0   . Multiple Vitamins-Minerals (MULTIVITAMIN WITH MINERALS) tablet   Oral   Take 1 tablet by mouth daily.           . promethazine (PHENERGAN) 25 MG tablet   Oral   Take 1 tablet (25 mg total) by mouth every 6 (six) hours as needed for nausea.   12 tablet   0   . RABEprazole (ACIPHEX) 20 MG tablet   Oral   Take 20 mg by mouth daily.            BP 144/102  Pulse 95  Temp(Src) 98.4 F (36.9 C) (Oral)  Resp 20  Ht 5\' 2"  (1.575 m)  Wt 127 lb (57.607 kg)  BMI 23.22 kg/m2  SpO2 100% Physical Exam  Nursing note and vitals reviewed. Constitutional: She is oriented to person, place, and time. She appears well-developed and well-nourished.  HENT:  Head: Normocephalic.  Mouth/Throat: Oropharynx is clear and moist. No oropharyngeal exudate.  Eyes: Conjunctivae and EOM are normal. Pupils are equal, round, and reactive to light.  Neck: Normal range of motion. Neck supple.  Cardiovascular: Normal rate, regular rhythm, normal heart sounds and intact distal pulses.  Exam reveals no gallop and no friction rub.   No murmur heard. Pulmonary/Chest: Effort normal and breath sounds normal. No respiratory distress. She has no wheezes.  Abdominal: Soft. Bowel sounds are normal. There is tenderness (mild diffuse, non-focal ttp). There is no rebound and no guarding.  Musculoskeletal: Normal range of motion. She exhibits no edema and no tenderness.  Neurological: She is alert and oriented to person, place, and time.  Skin: Skin is warm and dry.  Psychiatric: She has a normal mood and affect. Her behavior is normal.    ED Course  Procedures (including critical care time) Labs Review Labs Reviewed  URINALYSIS, ROUTINE W REFLEX MICROSCOPIC - Abnormal; Notable for the following:    APPearance CLOUDY (*)    Hgb urine dipstick SMALL (*)    All other components within normal limits  COMPREHENSIVE METABOLIC PANEL - Abnormal; Notable for the following:    Glucose, Bld 104  (*)    Alkaline Phosphatase 34 (*)    All other components within normal limits  URINE MICROSCOPIC-ADD ON - Abnormal; Notable for the following:    Squamous Epithelial / LPF FEW (*)    Bacteria, UA  MANY (*)    All other components within normal limits  CBC WITH DIFFERENTIAL  LIPASE, BLOOD  PREGNANCY, URINE   Imaging Review Ct Abdomen Pelvis W Contrast  06/28/2013   CLINICAL DATA:  Diffuse abdominal pain. Nausea. Diarrhea.  EXAM: CT ABDOMEN AND PELVIS WITH CONTRAST  TECHNIQUE: Multidetector CT imaging of the abdomen and pelvis was performed using the standard protocol following bolus administration of intravenous contrast.  CONTRAST:  OMNIPAQUE IOHEXOL 300 MG/ML  SOLN  COMPARISON:  02/16/2013  FINDINGS: Again demonstrated is a moderate hiatal hernia or dilated distal esophagus. Patient has history of prior esophagogastric fundoplasty. The liver, gallbladder, pancreas, spleen, adrenal glands, and kidneys are normal in appearance. No evidence of hydronephrosis. The no soft tissue masses or lymphadenopathy identified within the abdomen or pelvis prior hysterectomy noted. Adnexal regions are unremarkable.  No evidence of inflammatory process or abnormal fluid collections. Normal appendix is visualized. No evidence of bowel wall thickening, dilatation, or ventral abdominal wall hernia. No inguinal hernia identified.  IMPRESSION: Stable exam. No acute findings.  Stable moderate hiatal hernia versus dilated distal esophagus. Patient has past surgical history of prior esophagogastric fundoplasty. Consider endoscopy or upper GI series for further evaluation if clinically warranted.   Electronically Signed   By: Myles Rosenthal   On: 06/28/2013 10:51    MDM   1. Abdominal pain    8:55 AM 47 y.o. female presents with acute worsening of chronic abdominal pain which began yesterday. The patient states that she has had intermittent diffuse abdominal cramping for several months. She has seen a GI doctor who  she states believes her pain is stemming from irritable bowel syndrome. The patient denies any fevers or vomiting, has had mild diarrhea which began yesterday with her symptoms. She is afebrile and vital signs are unremarkable here. I suspect IBS. I discussed options of pain control alone versus imaging. The patient is requesting imaging. Will get labs, IV fluids, pain control, and CT scan.  11:51 AM: I interpreted/reviewed the labs and/or imaging which were non-contributory.  Will give small amount of pain medicine for home.  I have discussed the diagnosis/risks/treatment options with the patient and believe the pt to be eligible for discharge home to follow-up with her gi doctor. We also discussed returning to the ED immediately if new or worsening sx occur. We discussed the sx which are most concerning (e.g., worsening pain, fever) that necessitate immediate return. Any new prescriptions provided to the patient are listed below.   Junius Argyle, MD 06/29/13 [phone]

## 2013-10-21 ENCOUNTER — Emergency Department (HOSPITAL_BASED_OUTPATIENT_CLINIC_OR_DEPARTMENT_OTHER)
Admission: EM | Admit: 2013-10-21 | Discharge: 2013-10-21 | Disposition: A | Discharge status: Home / Self Care | Payer: Managed Care, Other (non HMO) | Attending: Emergency Medicine | Admitting: Emergency Medicine

## 2013-10-21 ENCOUNTER — Emergency Department (HOSPITAL_BASED_OUTPATIENT_CLINIC_OR_DEPARTMENT_OTHER): Payer: Managed Care, Other (non HMO)

## 2013-10-21 ENCOUNTER — Encounter (HOSPITAL_BASED_OUTPATIENT_CLINIC_OR_DEPARTMENT_OTHER): Payer: Self-pay | Admitting: Emergency Medicine

## 2013-10-21 DIAGNOSIS — Z79899 Other long term (current) drug therapy: Secondary | ICD-10-CM | POA: Insufficient documentation

## 2013-10-21 DIAGNOSIS — F17200 Nicotine dependence, unspecified, uncomplicated: Secondary | ICD-10-CM | POA: Insufficient documentation

## 2013-10-21 DIAGNOSIS — R002 Palpitations: Secondary | ICD-10-CM | POA: Insufficient documentation

## 2013-10-21 DIAGNOSIS — R209 Unspecified disturbances of skin sensation: Secondary | ICD-10-CM | POA: Insufficient documentation

## 2013-10-21 DIAGNOSIS — K589 Irritable bowel syndrome without diarrhea: Secondary | ICD-10-CM | POA: Insufficient documentation

## 2013-10-21 DIAGNOSIS — K219 Gastro-esophageal reflux disease without esophagitis: Secondary | ICD-10-CM | POA: Insufficient documentation

## 2013-10-21 DIAGNOSIS — G43909 Migraine, unspecified, not intractable, without status migrainosus: Secondary | ICD-10-CM | POA: Insufficient documentation

## 2013-10-21 DIAGNOSIS — I10 Essential (primary) hypertension: Secondary | ICD-10-CM | POA: Insufficient documentation

## 2013-10-21 DIAGNOSIS — F411 Generalized anxiety disorder: Secondary | ICD-10-CM | POA: Insufficient documentation

## 2013-10-21 LAB — CBC WITH DIFFERENTIAL/PLATELET
BASOS PCT: 0 % (ref 0–1)
Basophils Absolute: 0 10*3/uL (ref 0.0–0.1)
EOS ABS: 0.1 10*3/uL (ref 0.0–0.7)
EOS PCT: 1 % (ref 0–5)
HEMATOCRIT: 38.3 % (ref 36.0–46.0)
HEMOGLOBIN: 12.8 g/dL (ref 12.0–15.0)
LYMPHS ABS: 1.8 10*3/uL (ref 0.7–4.0)
Lymphocytes Relative: 40 % (ref 12–46)
MCH: 31.5 pg (ref 26.0–34.0)
MCHC: 33.4 g/dL (ref 30.0–36.0)
MCV: 94.3 fL (ref 78.0–100.0)
MONO ABS: 0.3 10*3/uL (ref 0.1–1.0)
MONOS PCT: 7 % (ref 3–12)
Neutro Abs: 2.3 10*3/uL (ref 1.7–7.7)
Neutrophils Relative %: 52 % (ref 43–77)
Platelets: 185 10*3/uL (ref 150–400)
RBC: 4.06 MIL/uL (ref 3.87–5.11)
RDW: 12.3 % (ref 11.5–15.5)
WBC: 4.5 10*3/uL (ref 4.0–10.5)

## 2013-10-21 LAB — BASIC METABOLIC PANEL
BUN: 14 mg/dL (ref 6–23)
CHLORIDE: 100 meq/L (ref 96–112)
CO2: 23 meq/L (ref 19–32)
Calcium: 9.9 mg/dL (ref 8.4–10.5)
Creatinine, Ser: 0.7 mg/dL (ref 0.50–1.10)
GFR calc Af Amer: >90 mL/min (ref >90)
GLUCOSE: 96 mg/dL (ref 70–99)
POTASSIUM: 3.9 meq/L (ref 3.7–5.3)
SODIUM: 139 meq/L (ref 137–147)

## 2013-10-21 LAB — D-DIMER, QUANTITATIVE (NOT AT ARMC): D DIMER QUANT: 0.44 ug{FEU}/mL (ref 0.00–0.48)

## 2013-10-21 LAB — TROPONIN I: Troponin I: <0.3 ng/mL (ref <0.30)

## 2013-10-21 MED ORDER — KETOROLAC TROMETHAMINE 30 MG/ML IJ SOLN
30.0000 mg | Freq: Once | INTRAMUSCULAR | Status: AC
  Administered 2013-10-21: 30 mg via INTRAVENOUS
  Filled 2013-10-21: qty 1

## 2013-10-21 NOTE — ED Notes (Signed)
Fluttery feeling in her chest on and off since yesterday. Her fingers on her left hand are numb.

## 2013-10-21 NOTE — ED Provider Notes (Signed)
CSN: [id number]     Arrival date & time 10/21/13  1918 History   First MD Initiated Contact with Patient 10/21/13 2026     Chief Complaint  Patient presents with  . Palpitations   (Consider location/radiation/quality/duration/timing/severity/associated sxs/prior Treatment) HPI Comments: Pt states that she is having a fluttering feeling in her chest since yesterday:pt states that she has also had some tingling in her fingers and toes:pt denies cp, sob, n/v, diaphoresis:pt states that about 3 months ago she was seen by cardiology for cp and she had a stress test and everything was fine:pt states that this is different then that time:nothing makes the symptoms better or worse:pt states that it will last for a minute and then it goes away on its own  The history is provided by the patient. No language interpreter was used.    Past Medical History  Diagnosis Date  . Hypertension   . Migraines   . Anxiety   . Anxiety   . Acid reflux   . IBS (irritable bowel syndrome)    Past Surgical History  Procedure Laterality Date  . Abdominal hysterectomy    . Gastrectomy    . Esophagogastric fundoplasty     No family history on file. History  Substance Use Topics  . Smoking status: Current Every Day Smoker -- 0.50 packs/day    Types: Cigarettes  . Smokeless tobacco: Never Used  . Alcohol Use: 1.2 oz/week    2 Cans of beer per week     Comment: daily   OB History   Grav Para Term Preterm Abortions TAB SAB Ect Mult Living                 Review of Systems  Constitutional: Negative.   Respiratory: Negative.   Cardiovascular: Negative.     Allergies  Adhesive; Meperidine hcl; Percocet; and Septra  Home Medications   Current Outpatient Rx  Name  Route  Sig  Dispense  Refill  . ALPRAZolam (XANAX) 0.25 MG tablet   Oral   Take 0.125 mg by mouth once as needed. For anxiety         . amLODipine-olmesartan (AZOR) 5-20 MG per tablet   Oral   Take 1 tablet by mouth daily.           . cefdinir (OMNICEF) 300 MG capsule   Oral   Take 300 mg by mouth 2 (two) times daily.         . clonazePAM (KLONOPIN) 0.5 MG tablet   Oral   Take 0.5 mg by mouth 2 (two) times daily as needed.         . dicyclomine (BENTYL) 20 MG tablet   Oral   Take 1 tablet (20 mg total) by mouth 2 (two) times daily.   20 tablet   0   . esomeprazole (NEXIUM) 40 MG capsule   Oral   Take 40 mg by mouth daily before breakfast.         . fish oil-omega-3 fatty acids 1000 MG capsule   Oral   Take 1 g by mouth 3 (three) times daily.         Marland Kitchen HYDROcodone-acetaminophen (NORCO/VICODIN) 5-325 MG per tablet   Oral   Take 2 tablets by mouth every 4 (four) hours as needed for pain.   10 tablet   0   . HYDROcodone-acetaminophen (NORCO/VICODIN) 5-325 MG per tablet   Oral   Take 2 tablets by mouth every 6 (six) hours as needed for  pain.   15 tablet   0   . HYDROcodone-acetaminophen (VICODIN) 5-500 MG per tablet   Oral   Take 1-2 tablets by mouth every 6 (six) hours as needed for pain.   15 tablet   0   . hyoscyamine (LEVBID) 0.375 MG 12 hr tablet   Oral   Take 0.375 mg by mouth every 12 (twelve) hours as needed.         Marland Kitchen. ibuprofen (ADVIL,MOTRIN) 600 MG tablet   Oral   Take 1 tablet (600 mg total) by mouth every 6 (six) hours as needed for pain.   30 tablet   0   . EXPIRED: metoCLOPramide (REGLAN) 10 MG tablet   Oral   Take 1 tablet (10 mg total) by mouth every 6 (six) hours.   15 tablet   0   . Multiple Vitamins-Minerals (MULTIVITAMIN WITH MINERALS) tablet   Oral   Take 1 tablet by mouth daily.           . promethazine (PHENERGAN) 25 MG tablet   Oral   Take 1 tablet (25 mg total) by mouth every 6 (six) hours as needed for nausea.   12 tablet   0   . RABEprazole (ACIPHEX) 20 MG tablet   Oral   Take 20 mg by mouth daily.            BP 169/97  Pulse 101  Temp(Src) 98.6 F (37 C) (Oral)  Resp 20  Ht 5\' 2"  (1.575 m)  Wt 127 lb (57.607 kg)  BMI 23.22 kg/m2   SpO2 100% Physical Exam  Nursing note and vitals reviewed. Constitutional: She is oriented to person, place, and time. She appears well-developed and well-nourished.  HENT:  Head: Normocephalic and atraumatic.  Cardiovascular: Normal rate and regular rhythm.   Pulmonary/Chest: Effort normal and breath sounds normal.  Musculoskeletal: Normal range of motion.  Neurological: She is alert and oriented to person, place, and time.  Skin: Skin is warm.  Psychiatric: She has a normal mood and affect.    ED Course  Procedures (including critical care time) Labs Review Labs Reviewed  BASIC METABOLIC PANEL  CBC WITH DIFFERENTIAL  TROPONIN I  D-DIMER, QUANTITATIVE   Imaging Review Dg Chest 2 View  10/21/2013   CLINICAL DATA:  Cardiac palpitations  EXAM: CHEST  2 VIEW  COMPARISON:  January 07, 2013  FINDINGS: Lungs are clear. Heart size and pulmonary vascularity are normal. No adenopathy. No pneumothorax. No bone lesions.  IMPRESSION: No abnormality noted.   Electronically Signed   By: Bretta BangWilliam  Woodruff M.D.   On: 10/21/2013 21:05    EKG Interpretation    Date/Time:  Monday October 21 2013 19:29:24 EST Ventricular Rate:  100 PR Interval:  134 QRS Duration: 80 QT Interval:  344 QTC Calculation: 443 R Axis:   45 Text Interpretation:  Normal sinus rhythm Normal ECG Confirmed by DOCHERTY  MD, MEGAN ([id number]) on 10/21/2013 7:33:23 PM Also confirmed by Micheline MazeHERTY  MD, MEGAN ([id number])  on 10/21/2013 7:34:00 PM            MDM   1. Palpitation    No acute findings noted:d dimer is negative:considered anxiety although pt states that she isn't stresssed   Teressa LowerVrinda Matthieu Loftus, NP 10/21/13 2259

## 2013-10-21 NOTE — ED Provider Notes (Signed)
Medical screening examination/treatment/procedure(s) were performed by non-physician practitioner and as supervising physician I was immediately available for consultation/collaboration.  EKG Interpretation    Date/Time:  Monday October 21 2013 19:29:24 EST Ventricular Rate:  100 PR Interval:  134 QRS Duration: 80 QT Interval:  344 QTC Calculation: 443 R Axis:   45 Text Interpretation:  Normal sinus rhythm Normal ECG Confirmed by DOCHERTY  MD, MEGAN ([id number]) on 10/21/2013 7:33:23 PM Also confirmed by Micheline MazeHERTY  MD, MEGAN [phone]([id number])  on 10/21/2013 7:34:00 PM Also confirmed by Micheline MazeHERTY  MD, MEGAN [phone]([id number])  on 10/21/2013 11:44:09 PM              Shanna CiscoMegan E Docherty, MD 10/21/13 2344

## 2013-10-21 NOTE — Discharge Instructions (Signed)
Palpitations  A palpitation is the feeling that your heartbeat is irregular. It may feel like your heart is fluttering or skipping a beat. It may also feel like your heart is beating faster than normal. This is usually not a serious problem. In some cases, you may need more medical tests. HOME CARE  Avoid:  Caffeine in coffee, tea, soft drinks, diet pills, and energy drinks.  Chocolate.  Alcohol.  Stop smoking if you smoke.  Reduce your stress and anxiety. Try:  A method that measures bodily functions so you can learn to control them (biofeedback).  Yoga.  Meditation.  Physical activity such as swimming, jogging, or walking.  Get plenty of rest and sleep. GET HELP RIGHT AWAY IF:   You have chest pain.  You feel short of breath.  You have a very bad headache.  You feel dizzy or pass out (faint).  Your fast or irregular heartbeat continues after 24 hours.  Your palpitations occur more often. MAKE SURE YOU:   Understand these instructions.  Will watch your condition.  Will get help right away if you are not doing well or get worse. Document Released: 07/12/2008 Document Revised: 04/03/2012 Document Reviewed: 12/02/2011 [employer] Patient Information 2014 [employer]  

## 2014-01-31 ENCOUNTER — Emergency Department (HOSPITAL_BASED_OUTPATIENT_CLINIC_OR_DEPARTMENT_OTHER): Payer: Managed Care, Other (non HMO)

## 2014-01-31 ENCOUNTER — Encounter (HOSPITAL_BASED_OUTPATIENT_CLINIC_OR_DEPARTMENT_OTHER): Payer: Self-pay | Admitting: Emergency Medicine

## 2014-01-31 ENCOUNTER — Emergency Department (HOSPITAL_BASED_OUTPATIENT_CLINIC_OR_DEPARTMENT_OTHER)
Admission: EM | Admit: 2014-01-31 | Discharge: 2014-01-31 | Disposition: A | Discharge status: Home / Self Care | Payer: Managed Care, Other (non HMO) | Attending: Emergency Medicine | Admitting: Emergency Medicine

## 2014-01-31 DIAGNOSIS — K589 Irritable bowel syndrome without diarrhea: Secondary | ICD-10-CM | POA: Insufficient documentation

## 2014-01-31 DIAGNOSIS — Z9089 Acquired absence of other organs: Secondary | ICD-10-CM | POA: Insufficient documentation

## 2014-01-31 DIAGNOSIS — R1013 Epigastric pain: Secondary | ICD-10-CM

## 2014-01-31 DIAGNOSIS — K219 Gastro-esophageal reflux disease without esophagitis: Secondary | ICD-10-CM | POA: Insufficient documentation

## 2014-01-31 DIAGNOSIS — F411 Generalized anxiety disorder: Secondary | ICD-10-CM | POA: Insufficient documentation

## 2014-01-31 DIAGNOSIS — R0602 Shortness of breath: Secondary | ICD-10-CM | POA: Insufficient documentation

## 2014-01-31 DIAGNOSIS — I10 Essential (primary) hypertension: Secondary | ICD-10-CM | POA: Insufficient documentation

## 2014-01-31 DIAGNOSIS — F17200 Nicotine dependence, unspecified, uncomplicated: Secondary | ICD-10-CM | POA: Insufficient documentation

## 2014-01-31 DIAGNOSIS — K859 Acute pancreatitis without necrosis or infection, unspecified: Secondary | ICD-10-CM | POA: Insufficient documentation

## 2014-01-31 DIAGNOSIS — G43909 Migraine, unspecified, not intractable, without status migrainosus: Secondary | ICD-10-CM | POA: Insufficient documentation

## 2014-01-31 DIAGNOSIS — Z792 Long term (current) use of antibiotics: Secondary | ICD-10-CM | POA: Insufficient documentation

## 2014-01-31 DIAGNOSIS — Z79899 Other long term (current) drug therapy: Secondary | ICD-10-CM | POA: Insufficient documentation

## 2014-01-31 LAB — CBC
HEMATOCRIT: 39.1 % (ref 36.0–46.0)
HEMOGLOBIN: 13.2 g/dL (ref 12.0–15.0)
MCH: 31.8 pg (ref 26.0–34.0)
MCHC: 33.8 g/dL (ref 30.0–36.0)
MCV: 94.2 fL (ref 78.0–100.0)
Platelets: 182 10*3/uL (ref 150–400)
RBC: 4.15 MIL/uL (ref 3.87–5.11)
RDW: 12.3 % (ref 11.5–15.5)
WBC: 5.7 10*3/uL (ref 4.0–10.5)

## 2014-01-31 LAB — COMPREHENSIVE METABOLIC PANEL
ALK PHOS: 35 U/L — AB (ref 39–117)
ALT: 19 U/L (ref 0–35)
AST: 16 U/L (ref 0–37)
Albumin: 3.8 g/dL (ref 3.5–5.2)
BUN: 16 mg/dL (ref 6–23)
CO2: 24 meq/L (ref 19–32)
Calcium: 9.9 mg/dL (ref 8.4–10.5)
Chloride: 103 mEq/L (ref 96–112)
Creatinine, Ser: 0.7 mg/dL (ref 0.50–1.10)
GLUCOSE: 102 mg/dL — AB (ref 70–99)
POTASSIUM: 4 meq/L (ref 3.7–5.3)
SODIUM: 140 meq/L (ref 137–147)
Total Bilirubin: <0.2 mg/dL — ABNORMAL LOW (ref 0.3–1.2)
Total Protein: 6.9 g/dL (ref 6.0–8.3)

## 2014-01-31 LAB — URINALYSIS, ROUTINE W REFLEX MICROSCOPIC
BILIRUBIN URINE: NEGATIVE
GLUCOSE, UA: NEGATIVE mg/dL
Hgb urine dipstick: NEGATIVE
KETONES UR: NEGATIVE mg/dL
Leukocytes, UA: NEGATIVE
NITRITE: NEGATIVE
PH: 7 (ref 5.0–8.0)
Protein, ur: NEGATIVE mg/dL
SPECIFIC GRAVITY, URINE: 1.019 (ref 1.005–1.030)
Urobilinogen, UA: 1 mg/dL (ref 0.0–1.0)

## 2014-01-31 LAB — LIPASE, BLOOD: Lipase: 71 U/L — ABNORMAL HIGH (ref 11–59)

## 2014-01-31 LAB — TROPONIN I: Troponin I: <0.3 ng/mL (ref <0.30)

## 2014-01-31 MED ORDER — FENTANYL CITRATE 0.05 MG/ML IJ SOLN
50.0000 ug | Freq: Once | INTRAMUSCULAR | Status: AC
  Administered 2014-01-31: 50 ug via INTRAVENOUS
  Filled 2014-01-31: qty 2

## 2014-01-31 MED ORDER — IOHEXOL 300 MG/ML  SOLN
50.0000 mL | Freq: Once | INTRAMUSCULAR | Status: AC | PRN
  Administered 2014-01-31: 50 mL via ORAL

## 2014-01-31 MED ORDER — IOHEXOL 300 MG/ML  SOLN: 100.0000 mL | Freq: Once | INTRAMUSCULAR | Status: DC | PRN

## 2014-01-31 MED ORDER — HYDROCODONE-ACETAMINOPHEN 5-325 MG PO TABS
1.0000 | ORAL_TABLET | Freq: Four times a day (QID) | ORAL | Status: DC | PRN
  Filled 2014-01-31: qty 1

## 2014-01-31 MED ORDER — GI COCKTAIL ~~LOC~~
30.0000 mL | Freq: Once | ORAL | Status: AC
  Administered 2014-01-31: 30 mL via ORAL

## 2014-01-31 MED ORDER — HYDROCODONE-ACETAMINOPHEN 5-325 MG PO TABS: 1.0000 | ORAL_TABLET | Freq: Four times a day (QID) | ORAL | Status: DC | PRN

## 2014-01-31 NOTE — ED Provider Notes (Signed)
CSN: [id number]     Arrival date & time 01/31/14  1615 History   First MD Initiated Contact with Patient 01/31/14 1656     Chief Complaint  Patient presents with  . Abdominal Pain     (Consider location/radiation/quality/duration/timing/severity/associated sxs/prior Treatment) Patient is a 48 y.o. female presenting with abdominal pain. The history is provided by the patient.  Abdominal Pain Pain location:  Epigastric Pain quality: tearing   Pain radiates to:  Does not radiate Pain severity:  Moderate Onset quality:  Sudden Timing:  Intermittent Progression:  Worsening Chronicity:  Recurrent Context: not eating, not sick contacts and not trauma   Relieved by:  Nothing Worsened by:  Nothing tried Associated symptoms: shortness of breath   Associated symptoms: no chest pain, no cough, no diarrhea, no fever, no nausea and no vomiting     Past Medical History  Diagnosis Date  . Hypertension   . Migraines   . Anxiety   . Anxiety   . Acid reflux   . IBS (irritable bowel syndrome)    Past Surgical History  Procedure Laterality Date  . Abdominal hysterectomy    . Gastrectomy    . Esophagogastric fundoplasty     No family history on file. History  Substance Use Topics  . Smoking status: Current Every Day Smoker -- 0.50 packs/day    Types: Cigarettes  . Smokeless tobacco: Never Used  . Alcohol Use: Yes     Comment: daily   OB History   Grav Para Term Preterm Abortions TAB SAB Ect Mult Living                 Review of Systems  Constitutional: Negative for fever.  Respiratory: Positive for shortness of breath. Negative for cough.   Cardiovascular: Negative for chest pain and leg swelling.  Gastrointestinal: Positive for abdominal pain. Negative for nausea, vomiting and diarrhea.  All other systems reviewed and are negative.     Allergies  Adhesive; Meperidine hcl; Percocet; and Septra  Home Medications   Prior to Admission medications   Medication Sig Start  Date End Date Taking? Authorizing Provider  ALPRAZolam (XANAX) 0.25 MG tablet Take 0.125 mg by mouth once as needed. For anxiety    Historical Provider, MD  amLODipine-olmesartan (AZOR) 5-20 MG per tablet Take 1 tablet by mouth daily.      Historical Provider, MD  cefdinir (OMNICEF) 300 MG capsule Take 300 mg by mouth 2 (two) times daily.    Historical Provider, MD  clonazePAM (KLONOPIN) 0.5 MG tablet Take 0.5 mg by mouth 2 (two) times daily as needed.    Historical Provider, MD  dicyclomine (BENTYL) 20 MG tablet Take 1 tablet (20 mg total) by mouth 2 (two) times daily. 09/23/12   Elson AreasLeslie K Sofia, PA-C  esomeprazole (NEXIUM) 40 MG capsule Take 40 mg by mouth daily before breakfast.    Historical Provider, MD  fish oil-omega-3 fatty acids 1000 MG capsule Take 1 g by mouth 3 (three) times daily.    Historical Provider, MD  HYDROcodone-acetaminophen (NORCO/VICODIN) 5-325 MG per tablet Take 2 tablets by mouth every 4 (four) hours as needed for pain. 09/23/12   Elson AreasLeslie K Sofia, PA-C  HYDROcodone-acetaminophen (NORCO/VICODIN) 5-325 MG per tablet Take 2 tablets by mouth every 6 (six) hours as needed for pain. 06/28/13   Junius ArgyleForrest S Harrison, MD  HYDROcodone-acetaminophen (VICODIN) 5-500 MG per tablet Take 1-2 tablets by mouth every 6 (six) hours as needed for pain. 01/07/13   Wynetta EmeryNicole Pisciotta, PA-C  hyoscyamine (LEVBID) 0.375 MG 12 hr tablet Take 0.375 mg by mouth every 12 (twelve) hours as needed.    Historical Provider, MD  ibuprofen (ADVIL,MOTRIN) 600 MG tablet Take 1 tablet (600 mg total) by mouth every 6 (six) hours as needed for pain. 03/07/13   Junius Finner, PA-C  metoCLOPramide (REGLAN) 10 MG tablet Take 1 tablet (10 mg total) by mouth every 6 (six) hours. 03/27/12 04/06/12  Gerhard Munch, MD  Multiple Vitamins-Minerals (MULTIVITAMIN WITH MINERALS) tablet Take 1 tablet by mouth daily.      Historical Provider, MD  promethazine (PHENERGAN) 25 MG tablet Take 1 tablet (25 mg total) by mouth every 6 (six) hours  as needed for nausea. 01/07/13   Nicole Pisciotta, PA-C  RABEprazole (ACIPHEX) 20 MG tablet Take 20 mg by mouth daily.      Historical Provider, MD   BP 128/90  Pulse 105  Temp(Src) 98.6 F (37 C) (Oral)  Resp 16  Ht 5\' 2"  (1.575 m)  Wt 127 lb (57.607 kg)  BMI 23.22 kg/m2  SpO2 100% Physical Exam  Nursing note and vitals reviewed. Constitutional: She is oriented to person, place, and time. She appears well-developed and well-nourished. No distress.  HENT:  Head: Normocephalic and atraumatic.  Eyes: EOM are normal. Pupils are equal, round, and reactive to light.  Neck: Normal range of motion. Neck supple.  Cardiovascular: Normal rate and regular rhythm.  Exam reveals no friction rub.   No murmur heard. Pulmonary/Chest: Effort normal and breath sounds normal. No respiratory distress. She has no wheezes. She has no rales.  Abdominal: Soft. She exhibits no distension. There is tenderness (epigastrum). There is no rebound.  Musculoskeletal: Normal range of motion. She exhibits no edema.  Neurological: She is alert and oriented to person, place, and time.  Skin: She is not diaphoretic.    ED Course  Procedures (including critical care time) Labs Review Labs Reviewed  URINALYSIS, ROUTINE W REFLEX MICROSCOPIC - Abnormal; Notable for the following:    APPearance CLOUDY (*)    All other components within normal limits  CBC  COMPREHENSIVE METABOLIC PANEL  LIPASE, BLOOD    Imaging Review Ct Abdomen Pelvis W Contrast  01/31/2014   CLINICAL DATA:  Abdominal pain for the past week.  EXAM: CT ABDOMEN AND PELVIS WITH CONTRAST  TECHNIQUE: Multidetector CT imaging of the abdomen and pelvis was performed using the standard protocol following bolus administration of intravenous contrast.  CONTRAST:  50mL OMNIPAQUE IOHEXOL 300 MG/ML  SOLN  COMPARISON:  Multiple priors, most recently 06/28/2013.  FINDINGS: Lung Bases: Markedly dilated distal esophagus, similar to prior examinations.   Abdomen/Pelvis: Postoperative changes of esophagogastric fundoplasty. The appearance of the liver, gallbladder, pancreas, spleen, bilateral adrenal glands and bilateral kidneys is unremarkable. No significant volume of ascites. No pneumoperitoneum. No pathologic distention of small bowel. No definite lymphadenopathy identified within the abdomen or pelvis. Status post hysterectomy. Ovaries are unremarkable in appearance. Urinary bladder is normal in appearance.  Musculoskeletal: There are no aggressive appearing lytic or blastic lesions noted in the visualized portions of the skeleton.  IMPRESSION: 1. No acute findings in the abdomen or pelvis to account for the patient's symptoms. 2. Status post esophagogastric fundoplasty. 3. Markedly dilated distal esophagus, unchanged compared to several prior examinations.   Electronically Signed   By: Trudie Reed M.D.   On: 01/31/2014 19:10     EKG Interpretation   Date/Time:  Friday January 31 2014 17:59:18 EDT Ventricular Rate:  78 PR Interval:  136 QRS  Duration: 70 QT Interval:  348 QTC Calculation: 396 R Axis:   63 Text Interpretation:  Normal sinus rhythm Septal infarct , age  undetermined Abnormal ECG Similar to prior Confirmed by Gwendolyn GrantWALDEN  MD, Mozell Haber  [phone]([id number]) on 01/31/2014 6:20:40 PM      MDM   Final diagnoses:  Pancreatitis  Epigastric pain    48 year old female here for abdominal pain. 3 weeks of intermittent tearing, ripping sensation in her epigastrium. Associated shortness of breath. No chest pain. No nausea, vomiting, diarrhea. No fever. History of gastrectomy in that area with surgical scar present. States sometimes she feels bulges in her surgical scar. Hx of ulcers and Barrett's esophagus. On exam, epigastric pain over surgical scar. No hernia felt per me. No other abdominal pain. We'll CT scan her abdomen. Also check EKG, troponin for shortness of breath, strange sensation in her epigastrium, lower chest. CT normal, labs with mild  lipase elevation. No pancreatitis findings on CT.  Patient stable for discharge, feeling better, given pain meds, instructed to f/u with PCP.   I have reviewed all labs and imaging and considered them in my medical decision making.    Dagmar HaitWilliam Kavina Cantave, MD 02/01/14 0030

## 2014-01-31 NOTE — ED Notes (Addendum)
Ct aware that patient finished her contrast. Pt ambulated to bathroom.

## 2014-01-31 NOTE — ED Notes (Signed)
C/o abd pain x 1 week-denies n/v/d, urinary s/s or vaginal d/c

## 2014-01-31 NOTE — Discharge Instructions (Signed)
Acute Pancreatitis °Acute pancreatitis is a disease in which the pancreas becomes suddenly inflamed. The pancreas is a large gland located behind your stomach. The pancreas produces enzymes that help digest food. The pancreas also releases the hormones glucagon and insulin that help regulate blood sugar. Damage to the pancreas occurs when the digestive enzymes from the pancreas are activated and begin attacking the pancreas before being released into the intestine. Most acute attacks last a couple of days and can cause serious complications. Some people become dehydrated and develop low blood pressure. In severe cases, bleeding into the pancreas can lead to shock and can be life-threatening. The lungs, heart, and kidneys may fail. °CAUSES  °Pancreatitis can happen to anyone. In some cases, the cause is unknown. Most cases are caused by: °· Alcohol abuse. °· Gallstones. °Other less common causes are: °· Certain medicines. °· Exposure to certain chemicals. °· Infection. °· Damage caused by an accident (trauma). °· Abdominal surgery. °SYMPTOMS  °· Pain in the upper abdomen that may radiate to the back. °· Tenderness and swelling of the abdomen. °· Nausea and vomiting. °DIAGNOSIS  °Your caregiver will perform a physical exam. Blood and stool tests may be done to confirm the diagnosis. Imaging tests may also be done, such as X-rays, CT scans, or an ultrasound of the abdomen. °TREATMENT  °Treatment usually requires a stay in the hospital. Treatment may include: °· Pain medicine. °· Fluid replacement through an intravenous line (IV). °· Placing a tube in the stomach to remove stomach contents and control vomiting. °· Not eating for 3 or 4 days. This gives your pancreas a rest, because enzymes are not being produced that can cause further damage. °· Antibiotic medicines if your condition is caused by an infection. °· Surgery of the pancreas or gallbladder. °HOME CARE INSTRUCTIONS  °· Follow the diet advised by your  caregiver. This may involve avoiding alcohol and decreasing the amount of fat in your diet. °· Eat smaller, more frequent meals. This reduces the amount of digestive juices the pancreas produces. °· Drink enough fluids to keep your urine clear or pale yellow. °· Only take over-the-counter or prescription medicines as directed by your caregiver. °· Avoid drinking alcohol if it caused your condition. °· Do not smoke. °· Get plenty of rest. °· Check your blood sugar at home as directed by your caregiver. °· Keep all follow-up appointments as directed by your caregiver. °SEEK MEDICAL CARE IF:  °· You do not recover as quickly as expected. °· You develop new or worsening symptoms. °· You have persistent pain, weakness, or nausea. °· You recover and then have another episode of pain. °SEEK IMMEDIATE MEDICAL CARE IF:  °· You are unable to eat or keep fluids down. °· Your pain becomes severe. °· You have a fever or persistent symptoms for more than 2 to 3 days. °· You have a fever and your symptoms suddenly get worse. °· Your skin or the white part of your eyes turn yellow (jaundice). °· You develop vomiting. °· You feel dizzy, or you faint. °· Your blood sugar is high (over 300 mg/dL). °MAKE SURE YOU:  °· Understand these instructions. °· Will watch your condition. °· Will get help right away if you are not doing well or get worse. °Document Released: 10/03/2005 Document Revised: 04/03/2012 Document Reviewed: 01/12/2012 °[employer]® Patient Information ©2014 [employer]. ° °

## 2014-02-20 ENCOUNTER — Other Ambulatory Visit (HOSPITAL_COMMUNITY): Payer: Self-pay | Admitting: Rheumatology

## 2014-02-20 ENCOUNTER — Ambulatory Visit (HOSPITAL_COMMUNITY)
Admission: RE | Admit: 2014-02-20 | Discharge: 2014-02-20 | Disposition: A | Discharge status: Home / Self Care | Payer: Managed Care, Other (non HMO) | Source: Ambulatory Visit | Attending: Rheumatology | Admitting: Rheumatology

## 2014-02-20 DIAGNOSIS — F17200 Nicotine dependence, unspecified, uncomplicated: Secondary | ICD-10-CM

## 2014-02-20 DIAGNOSIS — I10 Essential (primary) hypertension: Secondary | ICD-10-CM | POA: Insufficient documentation

## 2014-08-06 ENCOUNTER — Ambulatory Visit: Payer: Self-pay | Admitting: Podiatry

## 2014-08-08 ENCOUNTER — Encounter: Payer: Self-pay | Admitting: Podiatry

## 2014-08-08 ENCOUNTER — Ambulatory Visit (INDEPENDENT_AMBULATORY_CARE_PROVIDER_SITE_OTHER): Payer: Managed Care, Other (non HMO) | Admitting: Podiatry

## 2014-08-08 VITALS — BP 145/101 | HR 96 | Ht 62.0 in | Wt 125.0 lb

## 2014-08-08 DIAGNOSIS — L600 Ingrowing nail: Secondary | ICD-10-CM

## 2014-08-08 DIAGNOSIS — M79604 Pain in right leg: Secondary | ICD-10-CM

## 2014-08-08 DIAGNOSIS — M79606 Pain in leg, unspecified: Secondary | ICD-10-CM | POA: Insufficient documentation

## 2014-08-08 NOTE — Progress Notes (Signed)
Subjective: 48 year old female presents complaining of pain from ingrown nail in right great toe x 3 weeks. Has had this problem off and on. Also concerned with thick deformed big toe nail. 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  Objective: Derma: Thick dystrophic nails with fungal debris both great toes. Ingrown nail with callused nail border right hallux medial border.  Vascular: All pedal pulses are palpable. Orthopedic: Rectus foot without gross deformity.  Assessment: Painful ingrown nail right great toe medial border without drainage or sign of infection..  Plan: Office surgery done for ingrown nail right hallux medial border with Phenol and Alcohol.  Local uses with total 4ml of 50/50 mixture 0.5% Marcaine plain and 1% Xylocaine with Epinephrine. Patient tolerated well. Return in one week for follow up.

## 2014-08-08 NOTE — Patient Instructions (Signed)
Ingrown nail surgery done on left great toe. Follow soaking instruction. Return in one week. 

## 2014-08-15 ENCOUNTER — Encounter: Payer: Managed Care, Other (non HMO) | Admitting: Podiatry

## 2014-08-18 ENCOUNTER — Encounter: Payer: Self-pay | Admitting: Podiatry

## 2014-08-18 ENCOUNTER — Ambulatory Visit (INDEPENDENT_AMBULATORY_CARE_PROVIDER_SITE_OTHER): Payer: Managed Care, Other (non HMO) | Admitting: Podiatry

## 2014-08-18 DIAGNOSIS — L600 Ingrowing nail: Secondary | ICD-10-CM

## 2014-08-18 NOTE — Progress Notes (Signed)
One week post op nail right medial border. Mild swollen corner with tenderness. Nail is thick. May have to stay in open toed shoes. No drainage or erythema noted.  Return if swelling gets worse.

## 2014-08-18 NOTE — Patient Instructions (Signed)
Follow up visit after toe nail surgery. Swollen toe may be due to friction from closed in shoes. Stay in open toed shoes and soak daily till pain and swelling subside. Return as needed.

## 2015-03-26 ENCOUNTER — Encounter (HOSPITAL_BASED_OUTPATIENT_CLINIC_OR_DEPARTMENT_OTHER): Payer: Self-pay

## 2015-03-26 ENCOUNTER — Emergency Department (HOSPITAL_BASED_OUTPATIENT_CLINIC_OR_DEPARTMENT_OTHER)
Admission: EM | Admit: 2015-03-26 | Discharge: 2015-03-26 | Disposition: A | Discharge status: Home / Self Care | Payer: BLUE CROSS/BLUE SHIELD | Attending: Emergency Medicine | Admitting: Emergency Medicine

## 2015-03-26 ENCOUNTER — Emergency Department (HOSPITAL_BASED_OUTPATIENT_CLINIC_OR_DEPARTMENT_OTHER): Payer: BLUE CROSS/BLUE SHIELD

## 2015-03-26 ENCOUNTER — Other Ambulatory Visit: Payer: Self-pay

## 2015-03-26 DIAGNOSIS — Z79899 Other long term (current) drug therapy: Secondary | ICD-10-CM | POA: Insufficient documentation

## 2015-03-26 DIAGNOSIS — Z720 Tobacco use: Secondary | ICD-10-CM | POA: Insufficient documentation

## 2015-03-26 DIAGNOSIS — R079 Chest pain, unspecified: Secondary | ICD-10-CM | POA: Insufficient documentation

## 2015-03-26 DIAGNOSIS — I10 Essential (primary) hypertension: Secondary | ICD-10-CM | POA: Insufficient documentation

## 2015-03-26 DIAGNOSIS — M25512 Pain in left shoulder: Secondary | ICD-10-CM | POA: Diagnosis not present

## 2015-03-26 DIAGNOSIS — G43909 Migraine, unspecified, not intractable, without status migrainosus: Secondary | ICD-10-CM | POA: Insufficient documentation

## 2015-03-26 DIAGNOSIS — K219 Gastro-esophageal reflux disease without esophagitis: Secondary | ICD-10-CM | POA: Insufficient documentation

## 2015-03-26 DIAGNOSIS — F419 Anxiety disorder, unspecified: Secondary | ICD-10-CM | POA: Insufficient documentation

## 2015-03-26 LAB — CBC
HEMATOCRIT: 37.4 % (ref 36.0–46.0)
Hemoglobin: 12.3 g/dL (ref 12.0–15.0)
MCH: 31.3 pg (ref 26.0–34.0)
MCHC: 32.9 g/dL (ref 30.0–36.0)
MCV: 95.2 fL (ref 78.0–100.0)
PLATELETS: 173 10*3/uL (ref 150–400)
RBC: 3.93 MIL/uL (ref 3.87–5.11)
RDW: 12 % (ref 11.5–15.5)
WBC: 4.1 10*3/uL (ref 4.0–10.5)

## 2015-03-26 LAB — BASIC METABOLIC PANEL
Anion gap: 6 (ref 5–15)
BUN: 15 mg/dL (ref 6–20)
CHLORIDE: 106 mmol/L (ref 101–111)
CO2: 25 mmol/L (ref 22–32)
Calcium: 9.2 mg/dL (ref 8.9–10.3)
Creatinine, Ser: 0.7 mg/dL (ref 0.44–1.00)
GFR calc Af Amer: >60 mL/min (ref >60)
GLUCOSE: 113 mg/dL — AB (ref 65–99)
Potassium: 3.3 mmol/L — ABNORMAL LOW (ref 3.5–5.1)
Sodium: 137 mmol/L (ref 135–145)

## 2015-03-26 LAB — TROPONIN I

## 2015-03-26 MED ORDER — DIAZEPAM 5 MG PO TABS
5.0000 mg | ORAL_TABLET | Freq: Once | ORAL | Status: AC
  Administered 2015-03-26: 5 mg via ORAL
  Filled 2015-03-26: qty 1

## 2015-03-26 MED ORDER — NAPROXEN 500 MG PO TABS: 500.0000 mg | ORAL_TABLET | Freq: Two times a day (BID) | ORAL | Status: DC

## 2015-03-26 NOTE — Discharge Instructions (Signed)
Take naproxen twice daily. Apply ice to your shoulder intermittently throughout the day, 15 minutes at a time. Follow-up with Dr. Pearletha Forge.  Shoulder Pain The shoulder is the joint that connects your arms to your body. The bones that form the shoulder joint include the upper arm bone (humerus), the shoulder blade (scapula), and the collarbone (clavicle). The top of the humerus is shaped like a ball and fits into a rather flat socket on the scapula (glenoid cavity). A combination of muscles and strong, fibrous tissues that connect muscles to bones (tendons) support your shoulder joint and hold the ball in the socket. Small, fluid-filled sacs (bursae) are located in different areas of the joint. They act as cushions between the bones and the overlying soft tissues and help reduce friction between the gliding tendons and the bone as you move your arm. Your shoulder joint allows a wide range of motion in your arm. This range of motion allows you to do things like scratch your back or throw a ball. However, this range of motion also makes your shoulder more prone to pain from overuse and injury. Causes of shoulder pain can originate from both injury and overuse and usually can be grouped in the following four categories:  Redness, swelling, and pain (inflammation) of the tendon (tendinitis) or the bursae (bursitis).  Instability, such as a dislocation of the joint.  Inflammation of the joint (arthritis).  Broken bone (fracture). HOME CARE INSTRUCTIONS   Apply ice to the sore area.  Put ice in a plastic bag.  Place a towel between your skin and the bag.  Leave the ice on for 15-20 minutes, 3-4 times per day for the first 2 days, or as directed by your health care provider.  Stop using cold packs if they do not help with the pain.  If you have a shoulder sling or immobilizer, wear it as long as your caregiver instructs. Only remove it to shower or bathe. Move your arm as little as possible, but keep  your hand moving to prevent swelling.  Squeeze a soft ball or foam pad as much as possible to help prevent swelling.  Only take over-the-counter or prescription medicines for pain, discomfort, or fever as directed by your caregiver. SEEK MEDICAL CARE IF:   Your shoulder pain increases, or new pain develops in your arm, hand, or fingers.  Your hand or fingers become cold and numb.  Your pain is not relieved with medicines. SEEK IMMEDIATE MEDICAL CARE IF:   Your arm, hand, or fingers are numb or tingling.  Your arm, hand, or fingers are significantly swollen or turn white or blue. MAKE SURE YOU:   Understand these instructions.  Will watch your condition.  Will get help right away if you are not doing well or get worse. Document Released: 07/13/2005 Document Revised: 02/17/2014 Document Reviewed: 09/17/2011 [facility] Patient Information 2015 [place], [place]. This information is not intended to replace advice given to you by your health care provider. Make sure you discuss any questions you have with your health care provider.

## 2015-03-26 NOTE — ED Notes (Signed)
No pain with movement. Reports pain is radiating into left chest.

## 2015-03-26 NOTE — ED Provider Notes (Signed)
CSN: [id number]     Arrival date & time 03/26/15  1558 History   First MD Initiated Contact with Patient 03/26/15 [phone]     Chief Complaint  Patient presents with  . Shoulder Pain     (Consider location/radiation/quality/duration/timing/severity/associated sxs/prior Treatment) HPI Comments: 49 year old female complaining of gradual onset left shoulder pain x 1 day radiating down her left arm gradually worsening throughout the day today. States over the past 2 days, she has been getting a strange feeling in the left side of her chest, and then today, she had a slight pain towards the left of her breast radiating towards her left shoulder, and her arm felt heavy when she was walking. She tried taking an aspirin with no change. No known injury or trauma. States she had tendinitis in her left shoulder in the past, however this feels different. Denies extremity numbness or tingling. Denies shortness of breath, cough, fever, chills, nausea, vomiting or diaphoresis.  Patient is a 49 y.o. female presenting with shoulder pain. The history is provided by the patient.  Shoulder Pain   Past Medical History  Diagnosis Date  . Hypertension   . Migraines   . Anxiety   . Anxiety   . Acid reflux   . IBS (irritable bowel syndrome)    Past Surgical History  Procedure Laterality Date  . Abdominal hysterectomy    . Gastrectomy    . Esophagogastric fundoplasty     No family history on file. History  Substance Use Topics  . Smoking status: Current Every Day Smoker -- 0.50 packs/day    Types: Cigarettes  . Smokeless tobacco: Never Used  . Alcohol Use: 0.0 oz/week    0 Standard drinks or equivalent per week     Comment: daily   OB History    No data available     Review of Systems  Cardiovascular: Positive for chest pain.  Musculoskeletal:       + L shoulder pain.  All other systems reviewed and are negative.     Allergies  Adhesive; Meperidine hcl; Percocet; and Septra  Home  Medications   Prior to Admission medications   Medication Sig Start Date End Date Taking? Authorizing Provider  ALPRAZolam (XANAX) 0.25 MG tablet Take 0.125 mg by mouth once as needed. For anxiety    Historical Provider, MD  amLODipine-olmesartan (AZOR) 5-20 MG per tablet Take 1 tablet by mouth daily.      Historical Provider, MD  clonazePAM (KLONOPIN) 0.5 MG tablet Take 0.5 mg by mouth 2 (two) times daily as needed.    Historical Provider, MD  dicyclomine (BENTYL) 20 MG tablet Take 1 tablet (20 mg total) by mouth 2 (two) times daily. 09/23/12   Elson Areas, PA-C  esomeprazole (NEXIUM) 40 MG capsule Take 40 mg by mouth daily before breakfast.    Historical Provider, MD  fish oil-omega-3 fatty acids 1000 MG capsule Take 1 g by mouth 3 (three) times daily.    Historical Provider, MD  Multiple Vitamins-Minerals (MULTIVITAMIN WITH MINERALS) tablet Take 1 tablet by mouth daily.      Historical Provider, MD  naproxen (NAPROSYN) 500 MG tablet Take 1 tablet (500 mg total) by mouth 2 (two) times daily. 03/26/15   Natasha Shidler M Jaysiah Marchetta, PA-C   BP 118/79 mmHg  Pulse 82  Temp(Src) 98.2 F (36.8 C) (Oral)  Resp 16  Ht  (1.575 m)  Wt 125 lb (56.7 kg)  BMI 22.86 kg/m2  SpO2 99% Physical Exam  Constitutional: She  is oriented to person, place, and time. She appears well-developed and well-nourished. No distress.  HENT:  Head: Normocephalic and atraumatic.  Mouth/Throat: Oropharynx is clear and moist.  Eyes: Conjunctivae and EOM are normal. Pupils are equal, round, and reactive to light.  Neck: Normal range of motion. Neck supple. No JVD present.  Cardiovascular: Normal rate, regular rhythm, normal heart sounds and intact distal pulses.   No extremity edema.  Pulmonary/Chest: Effort normal and breath sounds normal. No respiratory distress. She exhibits no tenderness.  Palpation of L side of chest alleviates the pain.  Abdominal: Soft. Bowel sounds are normal. There is no tenderness.  Musculoskeletal:  Normal range of motion. She exhibits no edema.  L trapezius muscle spasm. Neck normal. Palpation of L shoulder alleviates pain. FROM, pain with abduction. Crepitus noted.  Neurological: She is alert and oriented to person, place, and time. She has normal strength. No sensory deficit.  Speech fluent, goal oriented. Moves limbs without ataxia. Equal grip strength bilateral.  Skin: Skin is warm and dry. She is not diaphoretic.  Psychiatric: She has a normal mood and affect. Her behavior is normal.  Nursing note and vitals reviewed.   ED Course  Procedures (including critical care time) Labs Review Labs Reviewed  BASIC METABOLIC PANEL - Abnormal; Notable for the following:    Potassium 3.3 (*)    Glucose, Bld 113 (*)    All other components within normal limits  CBC  TROPONIN I    Imaging Review Dg Chest 2 View  03/26/2015   CLINICAL DATA:  Left-sided chest pain radiating to left arm and neck.  EXAM: CHEST  2 VIEW  COMPARISON:  02/20/2014  FINDINGS: The heart size and mediastinal contours are within normal limits. Both lungs are clear. The visualized skeletal structures are unremarkable.  IMPRESSION: No active cardiopulmonary disease.   Electronically Signed   By: Myles Rosenthal M.D.   On: 03/26/2015 16:44     EKG Interpretation   Date/Time:  Thursday March 26 2015 16:40:19 EDT Ventricular Rate:  80 PR Interval:  146 QRS Duration: 78 QT Interval:  370 QTC Calculation: 426 R Axis:   83 Text Interpretation:  Normal sinus rhythm Possible Left atrial enlargement  Septal infarct , age undetermined Abnormal ECG No significant change since  last tracing April 2015 Confirmed by WARD,  DO, KRISTEN [phone]) on  03/26/2015 4:50:17 PM      MDM   Final diagnoses:  Left shoulder pain   Nontoxic appearing, NAD. Shoulder pain is reproducible with abduction. The pain in her left breast is not reproducible, actually feels better with palpation on the left side of her chest. Given the chest pain  with associated shoulder pain, I could not rule out cardiac in nature, despite low suspicion. Workup negative. Heart score 3, unlikely cardiac. PERC negative. She does have some tenderness over the insertion of her biceps tendon. There may be some tendinitis or rotator cuff abnormality. Will discharge home with naproxen, and follow-up with Dr. Pearletha Forge and PCP. Stable for discharge. Return precautions given. Patient states understanding of treatment care plan and is agreeable.  Kathrynn Speed, PA-C 03/26/15 1751  Layla Maw Ward, DO 03/26/15 1816

## 2015-03-26 NOTE — ED Notes (Signed)
Pt moving arm while getting undressed and into a gown.

## 2015-03-26 NOTE — ED Notes (Signed)
EDP at bedside  

## 2015-03-26 NOTE — ED Notes (Signed)
C/o left shoulder pain radiating into left arm. No other s/s. Sts feels similar to last time she had tendonitis.

## 2016-08-13 ENCOUNTER — Emergency Department (HOSPITAL_BASED_OUTPATIENT_CLINIC_OR_DEPARTMENT_OTHER): Payer: BLUE CROSS/BLUE SHIELD

## 2016-08-13 ENCOUNTER — Encounter (HOSPITAL_BASED_OUTPATIENT_CLINIC_OR_DEPARTMENT_OTHER): Payer: Self-pay | Admitting: Adult Health

## 2016-08-13 ENCOUNTER — Emergency Department (HOSPITAL_BASED_OUTPATIENT_CLINIC_OR_DEPARTMENT_OTHER)
Admission: EM | Admit: 2016-08-13 | Discharge: 2016-08-13 | Disposition: A | Discharge status: Home / Self Care | Payer: BLUE CROSS/BLUE SHIELD | Attending: Emergency Medicine | Admitting: Emergency Medicine

## 2016-08-13 DIAGNOSIS — M25512 Pain in left shoulder: Secondary | ICD-10-CM | POA: Diagnosis present

## 2016-08-13 DIAGNOSIS — Z720 Tobacco use: Secondary | ICD-10-CM | POA: Diagnosis not present

## 2016-08-13 DIAGNOSIS — M778 Other enthesopathies, not elsewhere classified: Secondary | ICD-10-CM

## 2016-08-13 DIAGNOSIS — Z79899 Other long term (current) drug therapy: Secondary | ICD-10-CM | POA: Diagnosis not present

## 2016-08-13 DIAGNOSIS — I10 Essential (primary) hypertension: Secondary | ICD-10-CM | POA: Diagnosis not present

## 2016-08-13 DIAGNOSIS — G8929 Other chronic pain: Secondary | ICD-10-CM

## 2016-08-13 DIAGNOSIS — M7592 Shoulder lesion, unspecified, left shoulder: Secondary | ICD-10-CM | POA: Diagnosis not present

## 2016-08-13 DIAGNOSIS — M7582 Other shoulder lesions, left shoulder: Secondary | ICD-10-CM

## 2016-08-13 LAB — BASIC METABOLIC PANEL
Anion gap: 9 (ref 5–15)
BUN: 14 mg/dL (ref 6–20)
CALCIUM: 9.2 mg/dL (ref 8.9–10.3)
CO2: 23 mmol/L (ref 22–32)
Chloride: 105 mmol/L (ref 101–111)
Creatinine, Ser: 0.79 mg/dL (ref 0.44–1.00)
GFR calc Af Amer: >60 mL/min (ref >60)
GFR calc non Af Amer: >60 mL/min (ref >60)
GLUCOSE: 113 mg/dL — AB (ref 65–99)
Potassium: 3.4 mmol/L — ABNORMAL LOW (ref 3.5–5.1)
Sodium: 137 mmol/L (ref 135–145)

## 2016-08-13 LAB — CBC WITH DIFFERENTIAL/PLATELET
Basophils Absolute: 0 10*3/uL (ref 0.0–0.1)
Basophils Relative: 0 %
EOS PCT: 1 %
Eosinophils Absolute: 0.1 10*3/uL (ref 0.0–0.7)
HCT: 37.8 % (ref 36.0–46.0)
Hemoglobin: 12.6 g/dL (ref 12.0–15.0)
LYMPHS ABS: 2.1 10*3/uL (ref 0.7–4.0)
Lymphocytes Relative: 40 %
MCH: 31.4 pg (ref 26.0–34.0)
MCHC: 33.3 g/dL (ref 30.0–36.0)
MCV: 94.3 fL (ref 78.0–100.0)
MONO ABS: 0.4 10*3/uL (ref 0.1–1.0)
MONOS PCT: 7 %
Neutro Abs: 2.7 10*3/uL (ref 1.7–7.7)
Neutrophils Relative %: 52 %
PLATELETS: 191 10*3/uL (ref 150–400)
RBC: 4.01 MIL/uL (ref 3.87–5.11)
RDW: 12.5 % (ref 11.5–15.5)
WBC: 5.3 10*3/uL (ref 4.0–10.5)

## 2016-08-13 LAB — TROPONIN I: Troponin I: <0.03 ng/mL (ref <0.03)

## 2016-08-13 MED ORDER — KETOROLAC TROMETHAMINE 30 MG/ML IJ SOLN
15.0000 mg | Freq: Once | INTRAMUSCULAR | Status: AC
  Administered 2016-08-13: 15 mg via INTRAVENOUS
  Filled 2016-08-13: qty 1

## 2016-08-13 MED ORDER — MELOXICAM 15 MG PO TABS: 15.0000 mg | ORAL_TABLET | Freq: Every day | ORAL | 0 refills | Status: DC

## 2016-08-13 NOTE — ED Notes (Signed)
Pt states she has been having arm pain for three weeks. Pt denies fever, nausea, vomiting and diarrhea.  Pt took advil one hour ago.

## 2016-08-13 NOTE — ED Triage Notes (Signed)
Presents with left arm pain from shoulder to above elbow began 2 weeks ago, pain has increased in intensity and fequency, today it has been constant and pt is tearful. Pain is described as "hurt so bad and burning" denies radiation to neck, endorses some pain in axilla. States, "the pain takes my breath and it makes me feel shjort of breath." denies nausea. Pt is tearful.

## 2016-08-13 NOTE — ED Notes (Signed)
Back from xray via stretcher, alert, NAD, calm, interactive.

## 2016-08-13 NOTE — Discharge Instructions (Signed)
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 Rest your arm for the next 1-2 days and avoid repetitive movements with that arm, and perform gentle range of motion exercises. Use ice or heat to your shoulder throughout the day, using an ice/heat pack for 20 minutes at a time every hour for each. Start taking mobic daily to help with your symptoms, using tylenol as needed for additional relief. Follow up with your regular doctor and the sports medicine doctor in the next 1 week for recheck of ongoing shoulder pain. STOP SMOKING! Return to the ER for changes or worsening symptoms.

## 2016-08-13 NOTE — ED Provider Notes (Signed)
[facility]   CSN: [id number] Arrival date & time: 08/13/16  2103  By signing my name below, I, Majel Homer, attest that this documentation has been prepared under the direction and in the presence of non-physician practitioner, [address], PA-C. Electronically Signed: Majel Homer, Scribe. 08/13/2016. 9:34 PM.  History   Chief Complaint Chief Complaint  Patient presents with  . Arm Pain   The history is provided by the patient and medical records. No language interpreter was used.  Arm Pain  This is a chronic problem. The current episode started more than 1 week ago. The problem occurs constantly. The problem has been gradually worsening. Pertinent negatives include no chest pain, no abdominal pain and no shortness of breath. The symptoms are aggravated by bending and twisting. The symptoms are relieved by medications. Treatments tried: biofreeze. The treatment provided mild relief.   HPI Comments: TAMRYN POPKO is a 50 y.o. female with PMHx of HTN, GERD, anxiety, and IBS, who presents to the Emergency Department for an evaluation of left shoulder pain that began 3 weeks ago. Pt reports chronic left shoulder pain that feels similar; however, she notes it has worsened in intensity and frequency today. She describes her pain as gradually worsening, constant, 10/10 "aching," pain that radiates from her left shoulder and axilla to her left elbow. She notes it is exacerbated with movement and lying on her left side. She states she has applied Biofreeze to the area today with mild relief. Pt reports she received an X-ray once before for her left arm pain and was diagnosed with "tendonitis". 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 States that she has a swollen area in her axilla that she can feel. Denies recent injury to her left arm, fevers, chills, CP, SOB, abd pain, N/V/D/C, hematuria, dysuria, numbness,  tingling, weakness, skin changes, extremity swelling, diaphoresis, lightheadedness, personal or family hx of DVT/PE, recent surgery or immobilization, recent travel, use of birth control/estrogen therapies, and personal hx of MI. She reports that she does smoke cigarettes. +FHx of MI in her father and brother. Chart review reveals that last year she had a similar ED visit for L arm pain, and had a neg cardiac work up at that time.    Past Medical History:  Diagnosis Date  . Acid reflux   . Anxiety   . Anxiety   . Hypertension   . IBS (irritable bowel syndrome)   . Migraines     Patient Active Problem List   Diagnosis Date Noted  . Ingrown nail 08/08/2014  . Pain in lower limb 08/08/2014  . Abdominal pain 06/28/2013  . HEADACHE 01/26/2009  . GERD 01/09/2009  . ALLERGIC RHINITIS 01/06/2009  . DIARRHEA 01/06/2009  . UNSPECIFIED DISORDER OF ESOPHAGUS 12/15/2008  . ANXIETY STATE, UNSPECIFIED 11/18/2008  . TOBACCO ABUSE 11/18/2008  . HYPERTENSION 11/18/2008  . DYSPNEA 11/18/2008    Past Surgical History:  Procedure Laterality Date  . ABDOMINAL HYSTERECTOMY    . ESOPHAGOGASTRIC FUNDOPLASTY    . GASTRECTOMY      OB History    No data available     Home Medications    Prior to Admission medications   Medication Sig Start Date End Date Taking? Authorizing Provider  amLODipine-olmesartan (AZOR) 5-20 MG per tablet Take 1 tablet by mouth daily.     Yes Historical Provider, MD  esomeprazole (NEXIUM) 40 MG capsule Take 40 mg  by mouth daily before breakfast.   Yes Historical Provider, MD  ALPRAZolam (XANAX) 0.25 MG tablet Take 0.125 mg by mouth once as needed. For anxiety    Historical Provider, MD  clonazePAM (KLONOPIN) 0.5 MG tablet Take 0.5 mg by mouth 2 (two) times daily as needed.    Historical Provider, MD  dicyclomine (BENTYL) 20 MG tablet Take 1 tablet (20 mg total) by mouth 2 (two) times daily. 09/23/12   Elson AreasLeslie K Sofia, PA-C  fish oil-omega-3 fatty acids 1000 MG capsule Take  1 g by mouth 3 (three) times daily.    Historical Provider, MD  Multiple Vitamins-Minerals (MULTIVITAMIN WITH MINERALS) tablet Take 1 tablet by mouth daily.      Historical Provider, MD  naproxen (NAPROSYN) 500 MG tablet Take 1 tablet (500 mg total) by mouth 2 (two) times daily. 03/26/15   Kathrynn Speedobyn M Hess, PA-C    Family History History reviewed. No pertinent family history.  Social History Social History  Substance Use Topics  . Smoking status: Current Every Day Smoker    Packs/day: 0.50    Types: Cigarettes  . Smokeless tobacco: Never Used  . Alcohol use 0.0 oz/week     Comment: daily   Allergies   Adhesive [tape]; Meperidine hcl; Percocet [oxycodone-acetaminophen]; and Septra [bactrim]  Review of Systems Review of Systems  Constitutional: Negative for chills, diaphoresis and fever.  Respiratory: Negative for shortness of breath.   Cardiovascular: Negative for chest pain.  Gastrointestinal: Negative for abdominal pain, constipation, diarrhea, nausea and vomiting.  Genitourinary: Negative for dysuria and hematuria.  Musculoskeletal: Positive for arthralgias (L shoulder) and myalgias. Negative for joint swelling.  Skin: Negative for rash.  Allergic/Immunologic: Negative for immunocompromised state.  Neurological: Negative for weakness, light-headedness and numbness.  Psychiatric/Behavioral: Negative for confusion.  10 systems reviewed and all are negative for acute change except as noted in the HPI.  Physical Exam Updated Vital Signs BP 165/88 (BP Location: Right Arm)   Pulse 100   Temp 98.1 F (36.7 C) (Oral)   Resp 24   Ht 5\' 2"  (1.575 m)   Wt 125 lb (56.7 kg)   SpO2 99%   BMI 22.86 kg/m   Physical Exam  Constitutional: She is oriented to person, place, and time. Vital signs are normal. She appears well-developed and well-nourished.  Non-toxic appearance. No distress.  Afebrile, nontoxic, NAD  HENT:  Head: Normocephalic and atraumatic.  Mouth/Throat: Oropharynx is  clear and moist and mucous membranes are normal.  Eyes: Conjunctivae and EOM are normal. Right eye exhibits no discharge. Left eye exhibits no discharge.  Neck: Normal range of motion. Neck supple.  Cardiovascular: Normal rate, regular rhythm, normal heart sounds and intact distal pulses.  Exam reveals no gallop and no friction rub.   No murmur heard. RRR, nl s1/s2, no m/r/g, distal pulses intact, no pedal edema  Pulmonary/Chest: Effort normal and breath sounds normal. No respiratory distress. She has no decreased breath sounds. She has no wheezes. She has no rhonchi. She has no rales.  CTAB in all lung fields, no w/r/r, no hypoxia or increased WOB, speaking in full sentences, SpO2 99% on RA  Abdominal: Soft. Normal appearance and bowel sounds are normal. She exhibits no distension. There is no tenderness. There is no rigidity, no rebound, no guarding, no CVA tenderness, no tenderness at McBurney's point and negative Murphy's sign.  Musculoskeletal: Normal range of motion.       Left shoulder: She exhibits tenderness. She exhibits normal range of motion, no  bony tenderness, no swelling, no effusion, no crepitus, no deformity, no spasm, normal pulse and normal strength.  Left shoulder with FROM intact, no focal bony or joint line TTP, with mild diffuse bicipital tendon, deltoid, and supraspinatus muscular TTP without spasms, no swelling/effusion, no bruising or erythema, no warmth, no crepitus/deformity, negative apley scratch, neg pain with resisted int/ext rotation, neg empty can test. Strength and sensation grossly intact in all extremities, distal pulses intact. No pedal edema.  Lymphadenopathy:    She has no axillary adenopathy.  No axillary swelling or LAD, no skin changes of the axilla  Neurological: She is alert and oriented to person, place, and time. She has normal strength. No sensory deficit.  Skin: Skin is warm, dry and intact. No rash noted.  Psychiatric: She has a normal mood and  affect.  Nursing note and vitals reviewed.  ED Treatments / Results  Labs (all labs ordered are listed, but only abnormal results are displayed) Labs Reviewed  BASIC METABOLIC PANEL - Abnormal; Notable for the following:       Result Value   Potassium 3.4 (*)    Glucose, Bld 113 (*)    All other components within normal limits  CBC WITH DIFFERENTIAL/PLATELET  TROPONIN I    EKG  EKG Interpretation  Date/Time:  Saturday August 13 2016 21:32:46 EDT Ventricular Rate:  90 PR Interval:  146 QRS Duration: 82 QT Interval:  358 QTC Calculation: 437 R Axis:   35 Text Interpretation:  Normal sinus rhythm Possible Left atrial enlargement Septal infarct , age undetermined Abnormal ECG No significant change was found Confirmed by Read Drivers  MD, Jonny Ruiz ([id number]) on 08/13/2016 10:56:17 PM       Radiology Dg Chest 2 View  Result Date: 08/13/2016 CLINICAL DATA:  Left shoulder pain. EXAM: CHEST  2 VIEW COMPARISON:  Radiographs of March 26, 2015. FINDINGS: The heart size and mediastinal contours are within normal limits. Both lungs are clear. No pneumothorax or pleural effusion is noted. The visualized skeletal structures are unremarkable. IMPRESSION: No active cardiopulmonary disease. Electronically Signed   By: Lupita Raider, M.D.   On: 08/13/2016 21:55    Procedures Procedures (including critical care time)  Medications Ordered in ED Medications  ketorolac (TORADOL) 30 MG/ML injection 15 mg (15 mg Intravenous Given 08/13/16 2157)    DIAGNOSTIC STUDIES:  Oxygen Saturation is 99% on RA, normal by my interpretation.    COORDINATION OF CARE:  9:30 PM Discussed treatment plan with pt at bedside and pt agreed to plan.  Initial Impression / Assessment and Plan / ED Course  I have reviewed the triage vital signs and the nursing notes.  Pertinent labs & imaging results that were available during my care of the patient were reviewed by me and considered in my medical decision making (see  chart for details).  Clinical Course    50 y.o. female here with L shoulder pain that has been a chronic issue but worsened over the last 3wks. 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 No CP/SOB, no LE swelling, PERC neg, doubt DVT/PE. Was seen for something very similar last year, had neg cardiac work up. No focal bony TTP to shoulder, but some mild diffuse muscular tenderness and biceps tendon tenderness. Likely overuse/tendinitis pain, but given that she has a family cardiac hx, and complaining of L axilla/arm pain, will obtain basic labs, EKG, CXR (noted to tech to  include as much of L shoulder as possible), and give toradol. Doubt need for ASA/NTG. Will reassess shortly  10:59 PM CBC w/diff WNL. BMP essentially unremarkable aside from K 3.4 but doubt need for repletion. Trop neg. EKG unchanged from prior and without acute ischemic findings. CXR neg. Likely overuse/tendinitis type pain from L shoulder, especially given that this is a chronic issue. Doubt need for sling given that this could actually cause her to develop adhesive capsulitis, will avoid this; discussed ROM exercises to perform. Discussed RICE, heat use, tylenol and will rx mobic for symptoms, and f/up with PCP and sports med provider in 1 wk for recheck and ongoing management of her symptoms. Smoking cessation advised. I explained the diagnosis and have given explicit precautions to return to the ER including for any other new or worsening symptoms. The patient understands and accepts the medical plan as it's been dictated and I have answered their questions. Discharge instructions concerning home care and prescriptions have been given. The patient is STABLE and is discharged to home in good condition.   I personally performed the services described in this documentation, which was scribed in my presence. The recorded information has been  reviewed and is accurate.   Final Clinical Impressions(s) / ED Diagnoses   Final diagnoses:  Chronic left shoulder pain  Tendinitis of shoulder, left  Tobacco use    New Prescriptions New Prescriptions   MELOXICAM (MOBIC) 15 MG TABLET    Take 1 tablet (15 mg total) by mouth daily. TAKE WITH MEALS     Javen Ridings Camprubi-Soms, PA-C 08/13/16 [id number]    Shaune Pollackameron Isaacs, MD 08/14/16 [id number]

## 2016-08-13 NOTE — ED Notes (Signed)
Monitor in pt room is not working, therefore pt is not on cardiac monitor. PA made aware.

## 2016-12-23 ENCOUNTER — Encounter (HOSPITAL_BASED_OUTPATIENT_CLINIC_OR_DEPARTMENT_OTHER): Payer: Self-pay | Admitting: Emergency Medicine

## 2016-12-23 ENCOUNTER — Emergency Department (HOSPITAL_BASED_OUTPATIENT_CLINIC_OR_DEPARTMENT_OTHER): Payer: BLUE CROSS/BLUE SHIELD

## 2016-12-23 ENCOUNTER — Emergency Department (HOSPITAL_BASED_OUTPATIENT_CLINIC_OR_DEPARTMENT_OTHER)
Admission: EM | Admit: 2016-12-23 | Discharge: 2016-12-23 | Disposition: A | Discharge status: Home / Self Care | Payer: BLUE CROSS/BLUE SHIELD | Attending: Emergency Medicine | Admitting: Emergency Medicine

## 2016-12-23 DIAGNOSIS — I10 Essential (primary) hypertension: Secondary | ICD-10-CM | POA: Insufficient documentation

## 2016-12-23 DIAGNOSIS — R0789 Other chest pain: Secondary | ICD-10-CM | POA: Diagnosis not present

## 2016-12-23 DIAGNOSIS — F17210 Nicotine dependence, cigarettes, uncomplicated: Secondary | ICD-10-CM | POA: Diagnosis not present

## 2016-12-23 DIAGNOSIS — R0602 Shortness of breath: Secondary | ICD-10-CM | POA: Diagnosis present

## 2016-12-23 DIAGNOSIS — Z791 Long term (current) use of non-steroidal anti-inflammatories (NSAID): Secondary | ICD-10-CM | POA: Insufficient documentation

## 2016-12-23 LAB — CBC
HEMATOCRIT: 37.9 % (ref 36.0–46.0)
HEMOGLOBIN: 12.7 g/dL (ref 12.0–15.0)
MCH: 31 pg (ref 26.0–34.0)
MCHC: 33.5 g/dL (ref 30.0–36.0)
MCV: 92.4 fL (ref 78.0–100.0)
Platelets: 196 10*3/uL (ref 150–400)
RBC: 4.1 MIL/uL (ref 3.87–5.11)
RDW: 13.3 % (ref 11.5–15.5)
WBC: 5.1 10*3/uL (ref 4.0–10.5)

## 2016-12-23 LAB — BASIC METABOLIC PANEL
Anion gap: 9 (ref 5–15)
BUN: 16 mg/dL (ref 6–20)
CHLORIDE: 103 mmol/L (ref 101–111)
CO2: 23 mmol/L (ref 22–32)
Calcium: 9.3 mg/dL (ref 8.9–10.3)
Creatinine, Ser: 0.75 mg/dL (ref 0.44–1.00)
GFR calc Af Amer: >60 mL/min (ref >60)
GLUCOSE: 107 mg/dL — AB (ref 65–99)
POTASSIUM: 3.5 mmol/L (ref 3.5–5.1)
Sodium: 135 mmol/L (ref 135–145)

## 2016-12-23 LAB — TROPONIN I: Troponin I: <0.03 ng/mL (ref <0.03)

## 2016-12-23 MED ORDER — NAPROXEN 500 MG PO TABS: 500.0000 mg | ORAL_TABLET | Freq: Two times a day (BID) | ORAL | 0 refills | Status: DC

## 2016-12-23 MED ORDER — CYCLOBENZAPRINE HCL 5 MG PO TABS
5.0000 mg | ORAL_TABLET | Freq: Once | ORAL | Status: AC
Start: 2016-12-23 — End: 2016-12-23
  Administered 2016-12-23: 5 mg via ORAL
  Filled 2016-12-23: qty 1

## 2016-12-23 MED ORDER — IBUPROFEN 800 MG PO TABS
800.0000 mg | ORAL_TABLET | Freq: Once | ORAL | Status: AC
Start: 2016-12-23 — End: 2016-12-23
  Administered 2016-12-23: 800 mg via ORAL
  Filled 2016-12-23: qty 1

## 2016-12-23 MED ORDER — METHOCARBAMOL 500 MG PO TABS: 500.0000 mg | ORAL_TABLET | Freq: Three times a day (TID) | ORAL | 0 refills | Status: DC | PRN

## 2016-12-23 NOTE — ED Notes (Signed)
PT using phone in waiting room. No distress.

## 2016-12-23 NOTE — ED Provider Notes (Signed)
[facility]   CSN: [id number] Arrival date & time: 12/23/16  1743  By signing my name below, I, [name], attest that this documentation has been prepared under the direction and in the presence of Rolland Porter, MD. Electronically Signed: [name], ED Scribe. 12/23/16. 9:14 PM.   History   Chief Complaint Chief Complaint  Patient presents with  . Back Pain  . Shortness of Breath   The history is provided by the patient. No language interpreter was used.   HPI Comments: Natasha Hickman is a 51 y.o. female who presents to the Emergency Department complaining of gradual onset, constant, moderate left shoulder pain beginning 3 weeks ago. Pain radiates from left shoulder, inferior to scapula through axilla to left pectoral. Pain is exacerbated with movement and deep inhalation. Denies similar symptoms. Pt reports associated shortness of breath. She denies recent unusual exercise. Pt is right handed.   Past Medical History:  Diagnosis Date  . Acid reflux   . Anxiety   . Anxiety   . Hypertension   . IBS (irritable bowel syndrome)   . Migraines     Patient Active Problem List   Diagnosis Date Noted  . Ingrown nail 08/08/2014  . Pain in lower limb 08/08/2014  . Abdominal pain 06/28/2013  . HEADACHE 01/26/2009  . GERD 01/09/2009  . ALLERGIC RHINITIS 01/06/2009  . DIARRHEA 01/06/2009  . UNSPECIFIED DISORDER OF ESOPHAGUS 12/15/2008  . ANXIETY STATE, UNSPECIFIED 11/18/2008  . TOBACCO ABUSE 11/18/2008  . HYPERTENSION 11/18/2008  . DYSPNEA 11/18/2008    Past Surgical History:  Procedure Laterality Date  . ABDOMINAL HYSTERECTOMY    . ESOPHAGOGASTRIC FUNDOPLASTY    . GASTRECTOMY      OB History    No data available     Home Medications    Prior to Admission medications   Medication Sig Start Date End Date Taking? Authorizing Provider  ALPRAZolam (XANAX) 0.25 MG tablet Take 0.125 mg by mouth once as needed. For anxiety    Historical Provider,  MD  amLODipine-olmesartan (AZOR) 5-20 MG per tablet Take 1 tablet by mouth daily.      Historical Provider, MD  clonazePAM (KLONOPIN) 0.5 MG tablet Take 0.5 mg by mouth 2 (two) times daily as needed.    Historical Provider, MD  dicyclomine (BENTYL) 20 MG tablet Take 1 tablet (20 mg total) by mouth 2 (two) times daily. 09/23/12   Elson Areas, PA-C  esomeprazole (NEXIUM) 40 MG capsule Take 40 mg by mouth daily before breakfast.    Historical Provider, MD  fish oil-omega-3 fatty acids 1000 MG capsule Take 1 g by mouth 3 (three) times daily.    Historical Provider, MD  meloxicam (MOBIC) 15 MG tablet Take 1 tablet (15 mg total) by mouth daily. TAKE WITH MEALS 08/13/16   Mercedes Street, PA-C  methocarbamol (ROBAXIN) 500 MG tablet Take 1 tablet (500 mg total) by mouth 3 (three) times daily between meals as needed. 12/23/16   Rolland Porter, MD  Multiple Vitamins-Minerals (MULTIVITAMIN WITH MINERALS) tablet Take 1 tablet by mouth daily.      Historical Provider, MD  naproxen (NAPROSYN) 500 MG tablet Take 1 tablet (500 mg total) by mouth 2 (two) times daily. 12/23/16   Rolland Porter, MD    Family History History reviewed. No pertinent family history.  Social History Social History  Substance Use Topics  . Smoking status: Current Every Day Smoker    Packs/day: 0.50    Types: Cigarettes  .  Smokeless tobacco: Never Used  . Alcohol use 0.0 oz/week     Comment: daily     Allergies   Adhesive [tape]; Meperidine hcl; Percocet [oxycodone-acetaminophen]; and Septra [bactrim]   Review of Systems Review of Systems  Constitutional: Negative for appetite change, chills, diaphoresis, fatigue and fever.  HENT: Negative for mouth sores, sore throat and trouble swallowing.   Eyes: Negative for visual disturbance.  Respiratory: Positive for shortness of breath. Negative for cough, chest tightness and wheezing.   Cardiovascular: Positive for chest pain.  Gastrointestinal: Negative for abdominal distention,  abdominal pain, diarrhea, nausea and vomiting.  Endocrine: Negative for polydipsia, polyphagia and polyuria.  Genitourinary: Negative for dysuria, frequency and hematuria.  Musculoskeletal: Positive for back pain. Negative for gait problem.  Skin: Negative for color change, pallor and rash.  Neurological: Negative for dizziness, syncope, light-headedness and headaches.  Hematological: Does not bruise/bleed easily.  Psychiatric/Behavioral: Negative for behavioral problems and confusion.     Physical Exam Updated Vital Signs BP 147/92   Pulse 93   Temp 99.5 F (37.5 C) (Oral)   Resp 22   Ht 5\' 2"  (1.575 m)   Wt 133 lb (60.3 kg)   SpO2 99%   BMI 24.33 kg/m   Physical Exam  Constitutional: She is oriented to person, place, and time. She appears well-developed and well-nourished. No distress.  HENT:  Head: Normocephalic.  Eyes: Conjunctivae are normal. Pupils are equal, round, and reactive to light. No scleral icterus.  Neck: Normal range of motion. Neck supple. No thyromegaly present.  Cardiovascular: Normal rate and regular rhythm.  Exam reveals no gallop and no friction rub.   No murmur heard. Pulmonary/Chest: Effort normal and breath sounds normal. No respiratory distress. She has no wheezes. She has no rales.  Abdominal: Soft. Bowel sounds are normal. She exhibits no distension. There is no tenderness. There is no rebound.  Musculoskeletal: Normal range of motion.  Tenderness in the left pectoralis Pain with a "punch position". Nontender over the ribs. No vesicles. Clear lungs. No pleuritic component.  Neurological: She is alert and oriented to person, place, and time.  Skin: Skin is warm and dry. No rash noted.  Psychiatric: She has a normal mood and affect. Her behavior is normal.    ED Treatments / Results  DIAGNOSTIC STUDIES: Oxygen Saturation is 100% on RA, normal by my interpretation.    COORDINATION OF CARE: 9:08 PM Pt advised to follow up with PCP if pain does  not improve in 5-7 days.  Labs (all labs ordered are listed, but only abnormal results are displayed) Labs Reviewed  BASIC METABOLIC PANEL - Abnormal; Notable for the following:       Result Value   Glucose, Bld 107 (*)    All other components within normal limits  CBC  TROPONIN I    EKG  EKG Interpretation  Date/Time:  Friday December 23 2016 18:12:09 EST Ventricular Rate:  89 PR Interval:  142 QRS Duration: 72 QT Interval:  328 QTC Calculation: 399 R Axis:   65 Text Interpretation:  Normal sinus rhythm Possible Left atrial enlargement Septal infarct , age undetermined Abnormal ECG Confirmed by Fayrene Fearing  MD, Kadarius Cuffe ([id number]) on 12/23/2016 9:12:28 PM       Radiology Dg Chest 2 View  Result Date: 12/23/2016 CLINICAL DATA:  Worsening LEFT arm and LEFT chest pain for 2 weeks. EXAM: CHEST  2 VIEW COMPARISON:  Chest radiograph August 13, 2016 FINDINGS: Cardiomediastinal silhouette is normal. No pleural effusions or focal consolidations.  Trachea projects midline and there is no pneumothorax. Soft tissue planes and included osseous structures are non-suspicious. IMPRESSION: Stable examination:  Normal chest. Electronically Signed   By: Awilda Metroourtnay  Bloomer M.D.   On: 12/23/2016 18:44    Procedures Procedures (including critical care time)  Medications Ordered in ED Medications  ibuprofen (ADVIL,MOTRIN) tablet 800 mg (not administered)  cyclobenzaprine (FLEXERIL) tablet 5 mg (not administered)     Initial Impression / Assessment and Plan / ED Course  I have reviewed the triage vital signs and the nursing notes.  Pertinent labs & imaging results that were available during my care of the patient were reviewed by me and considered in my medical decision making (see chart for details).     I personally performed the services described in this documentation, which was scribed in my presence. The recorded information has been reviewed and is accurate.   Final Clinical Impressions(s) / ED  Diagnoses   Final diagnoses:  Chest wall pain    Clearly musculoskeletal on exam. Low risk for cardiac disease. EKG without change. Normal chest x-ray and normal enzymes after several days of symptoms. Recommend anti-inflammatory, muscle relaxant, primary care follow-up.  New Prescriptions New Prescriptions   METHOCARBAMOL (ROBAXIN) 500 MG TABLET    Take 1 tablet (500 mg total) by mouth 3 (three) times daily between meals as needed.   NAPROXEN (NAPROSYN) 500 MG TABLET    Take 1 tablet (500 mg total) by mouth 2 (two) times daily.     Rolland PorterMark Dequarius Jeffries, MD 12/23/16 2126

## 2016-12-23 NOTE — Discharge Instructions (Signed)
Medications as prescribed. Follow up with your PCP if not improving.

## 2016-12-23 NOTE — ED Triage Notes (Signed)
Patient states that she has had pain to her left shoulder and back x 1 week. Reports that is it a spasm type pain. Patient denies a cough, denies any fevers. Denies any long trips. PAtient reports that pain is worse with movement and taking a deep breath

## 2017-10-14 ENCOUNTER — Encounter (HOSPITAL_BASED_OUTPATIENT_CLINIC_OR_DEPARTMENT_OTHER): Payer: Self-pay | Admitting: Emergency Medicine

## 2017-10-14 ENCOUNTER — Other Ambulatory Visit: Payer: Self-pay

## 2017-10-14 DIAGNOSIS — F17210 Nicotine dependence, cigarettes, uncomplicated: Secondary | ICD-10-CM | POA: Insufficient documentation

## 2017-10-14 DIAGNOSIS — Z79899 Other long term (current) drug therapy: Secondary | ICD-10-CM | POA: Insufficient documentation

## 2017-10-14 DIAGNOSIS — M79605 Pain in left leg: Secondary | ICD-10-CM | POA: Diagnosis not present

## 2017-10-14 DIAGNOSIS — I10 Essential (primary) hypertension: Secondary | ICD-10-CM | POA: Diagnosis not present

## 2017-10-14 NOTE — ED Triage Notes (Signed)
Pt c/o left calf and ankle pain with swelling x 1 week. Pt denies injury or shortness of breath.

## 2017-10-15 ENCOUNTER — Encounter (HOSPITAL_BASED_OUTPATIENT_CLINIC_OR_DEPARTMENT_OTHER): Payer: Self-pay | Admitting: Emergency Medicine

## 2017-10-15 ENCOUNTER — Ambulatory Visit (HOSPITAL_BASED_OUTPATIENT_CLINIC_OR_DEPARTMENT_OTHER)
Admit: 2017-10-15 | Discharge: 2017-10-15 | Disposition: A | Discharge status: Home / Self Care | Payer: BLUE CROSS/BLUE SHIELD | Source: Ambulatory Visit | Attending: Emergency Medicine | Admitting: Emergency Medicine

## 2017-10-15 ENCOUNTER — Emergency Department (HOSPITAL_BASED_OUTPATIENT_CLINIC_OR_DEPARTMENT_OTHER)
Admission: EM | Admit: 2017-10-15 | Discharge: 2017-10-15 | Disposition: A | Discharge status: Home / Self Care | Payer: BLUE CROSS/BLUE SHIELD | Attending: Emergency Medicine | Admitting: Emergency Medicine

## 2017-10-15 DIAGNOSIS — M79669 Pain in unspecified lower leg: Secondary | ICD-10-CM | POA: Insufficient documentation

## 2017-10-15 DIAGNOSIS — M7989 Other specified soft tissue disorders: Secondary | ICD-10-CM

## 2017-10-15 MED ORDER — IBUPROFEN 800 MG PO TABS
800.0000 mg | ORAL_TABLET | Freq: Once | ORAL | Status: AC
  Administered 2017-10-15: 800 mg via ORAL

## 2017-10-15 MED ORDER — IBUPROFEN 800 MG PO TABS
ORAL_TABLET | ORAL | Status: AC
  Filled 2017-10-15: qty 1

## 2017-10-15 NOTE — ED Notes (Signed)
ED Provider at bedside. 

## 2017-10-15 NOTE — ED Provider Notes (Signed)
[facility] EMERGENCY DEPARTMENT Provider Note   CSN: [id number] Arrival date & time: 10/14/17  2154     History   Chief Complaint Chief Complaint  Patient presents with  . Leg Swelling    HPI Natasha Hickman is a 51 y.o. female.  The history is provided by the patient.  Leg Pain   This is a new problem. The current episode started more than 1 week ago. The problem occurs constantly. The problem has not changed since onset.The pain is present in the left lower leg. The quality of the pain is described as aching. The pain is moderate. Pertinent negatives include no numbness, full range of motion, no stiffness, no tingling and no itching. The symptoms are aggravated by standing. She has tried nothing for the symptoms. The treatment provided no relief. There has been no history of extremity trauma. Family history is significant for no rheumatoid arthritis.    Past Medical History:  Diagnosis Date  . Acid reflux   . Anxiety   . Anxiety   . Hypertension   . IBS (irritable bowel syndrome)    No longer have  . Migraines     Patient Active Problem List   Diagnosis Date Noted  . Ingrown nail 08/08/2014  . Pain in lower limb 08/08/2014  . Abdominal pain 06/28/2013  . HEADACHE 01/26/2009  . GERD 01/09/2009  . ALLERGIC RHINITIS 01/06/2009  . DIARRHEA 01/06/2009  . UNSPECIFIED DISORDER OF ESOPHAGUS 12/15/2008  . ANXIETY STATE, UNSPECIFIED 11/18/2008  . TOBACCO ABUSE 11/18/2008  . HYPERTENSION 11/18/2008  . DYSPNEA 11/18/2008    Past Surgical History:  Procedure Laterality Date  . ABDOMINAL HYSTERECTOMY    . ESOPHAGOGASTRIC FUNDOPLASTY    . GASTRECTOMY      OB History    No data available       Home Medications    Prior to Admission medications   Medication Sig Start Date End Date Taking? Authorizing Provider  amLODipine-olmesartan (AZOR) 5-20 MG per tablet Take 1 tablet by mouth daily.     Yes [provider]  BLACK CURRANT SEED OIL PO Take  by mouth.   Yes [provider]  clonazePAM (KLONOPIN) 0.5 MG tablet Take 0.5 mg by mouth 2 (two) times daily as needed.   Yes [provider]  fish oil-omega-3 fatty acids 1000 MG capsule Take 1 g by mouth 3 (three) times daily.   Yes [provider]  Multiple Vitamins-Minerals (MULTIVITAMIN WITH MINERALS) tablet Take 1 tablet by mouth daily.     Yes [provider]  ALPRAZolam (XANAX) 0.25 MG tablet Take 0.125 mg by mouth once as needed. For anxiety    [provider]  dicyclomine (BENTYL) 20 MG tablet Take 1 tablet (20 mg total) by mouth 2 (two) times daily. 09/23/12   Elson AreasSofia, Leslie K, PA-C  esomeprazole (NEXIUM) 40 MG capsule Take 40 mg by mouth daily before breakfast.    [provider]  meloxicam (MOBIC) 15 MG tablet Take 1 tablet (15 mg total) by mouth daily. TAKE WITH MEALS 08/13/16   Street, MiddlevilleMercedes, PA-C  methocarbamol (ROBAXIN) 500 MG tablet Take 1 tablet (500 mg total) by mouth 3 (three) times daily between meals as needed. 12/23/16   Rolland PorterJames, Mark, MD  naproxen (NAPROSYN) 500 MG tablet Take 1 tablet (500 mg total) by mouth 2 (two) times daily. 12/23/16   Rolland PorterJames, Mark, MD    Family History No family history on file.  Social History Social History  Tobacco Use  . Smoking status: Current Every Day Smoker    Packs/day: 0.50    Types: Cigarettes  . Smokeless tobacco: Never Used  Substance Use Topics  . Alcohol use: Yes    Alcohol/week: 0.0 oz    Comment: daily  . Drug use: No     Allergies   Adhesive [tape]; Meperidine hcl; Percocet [oxycodone-acetaminophen]; and Septra [bactrim]   Review of Systems Review of Systems  Constitutional: Negative for diaphoresis and fever.  Respiratory: Negative for shortness of breath.   Cardiovascular: Positive for leg swelling. Negative for chest pain and palpitations.  Gastrointestinal: Negative for abdominal pain.  Musculoskeletal: Negative for stiffness.  Skin: Negative for itching.    Neurological: Negative for tingling and numbness.  All other systems reviewed and are negative.    Physical Exam Updated Vital Signs BP (!) 146/101 (BP Location: Left Arm) Comment: non compliant with meds  Pulse 96   Temp 98.1 F (36.7 C) (Oral)   Resp 18   Ht 5\' 2"  (1.575 m)   Wt 58.1 kg (128 lb)   SpO2 99%   BMI 23.41 kg/m   Physical Exam  Constitutional: She is oriented to person, place, and time. She appears well-developed and well-nourished. No distress.  HENT:  Head: Normocephalic and atraumatic.  Mouth/Throat: No oropharyngeal exudate.  Eyes: Conjunctivae are normal. Pupils are equal, round, and reactive to light.  Neck: Normal range of motion. Neck supple.  Cardiovascular: Normal rate, regular rhythm, normal heart sounds and intact distal pulses.  Pulmonary/Chest: Effort normal and breath sounds normal. No stridor. She has no wheezes. She has no rales.  Abdominal: Soft. Bowel sounds are normal. There is no tenderness.  Musculoskeletal: Normal range of motion. She exhibits no edema or deformity.       Left knee: Normal.       Left ankle: Normal. Achilles tendon normal.       Left lower leg: Normal. She exhibits no tenderness, no bony tenderness, no swelling, no edema, no deformity and no laceration.  Negative Homan's sign  Neurological: She is alert and oriented to person, place, and time. She displays normal reflexes.  Skin: Skin is warm and dry. Capillary refill takes less than 2 seconds.  Psychiatric: She has a normal mood and affect.     ED Treatments / Results   Vitals:   10/14/17 2201 10/15/17 0139  BP: (!) 146/101 (!) 146/108  Pulse: 96 85  Resp: 18 16  Temp: 98.1 F (36.7 C) 97.9 F (36.6 C)  SpO2: 99% 98%    Procedures Procedures (including critical care time)  Medications Ordered in ED Medications  ibuprofen (ADVIL,MOTRIN) tablet 800 mg (not administered)       Final Clinical Impressions(s) / ED Diagnoses   Low risk scheduled for  outpatient doppler.  Scheduled by radiology.  Exam and vitals are benign and reassuring.  No pain.  No indication for imaging at this time.    Return for fevers > 100.4 unrelieved by medication, weakness or numbness, stiff neck, intractable vomiting, or diarrhea, abdominal pain, Inability to tolerate liquids or food, cough, altered mental status or any concerns. No signs of systemic illness or infection. The patient is nontoxic-appearing on exam and vital signs are within normal limits.    I have reviewed the triage vital signs and the nursing notes. Pertinent labs &imaging results that were available during my care of the patient were reviewed by me and considered in my medical decision making (see chart for details).  After history, exam, and medical workup I feel the patient has been appropriately medically screened and is safe for discharge home. Pertinent diagnoses were discussed with the patient. Patient was given return precautions     ED Discharge Orders        Ordered    US Venous Img Lower Unilateral Left     10/14/17 2321       Evelynne Spiers, MD 10/15/17 [phone]

## 2017-12-08 IMAGING — CR DG CHEST 2V
2 series · 2 of 2 positions shown · non-contrast
Comparison: Radiographs March 26, 2015.

CLINICAL DATA: Left shoulder pain.

EXAM:
CHEST  2 VIEW

[w chest pa]
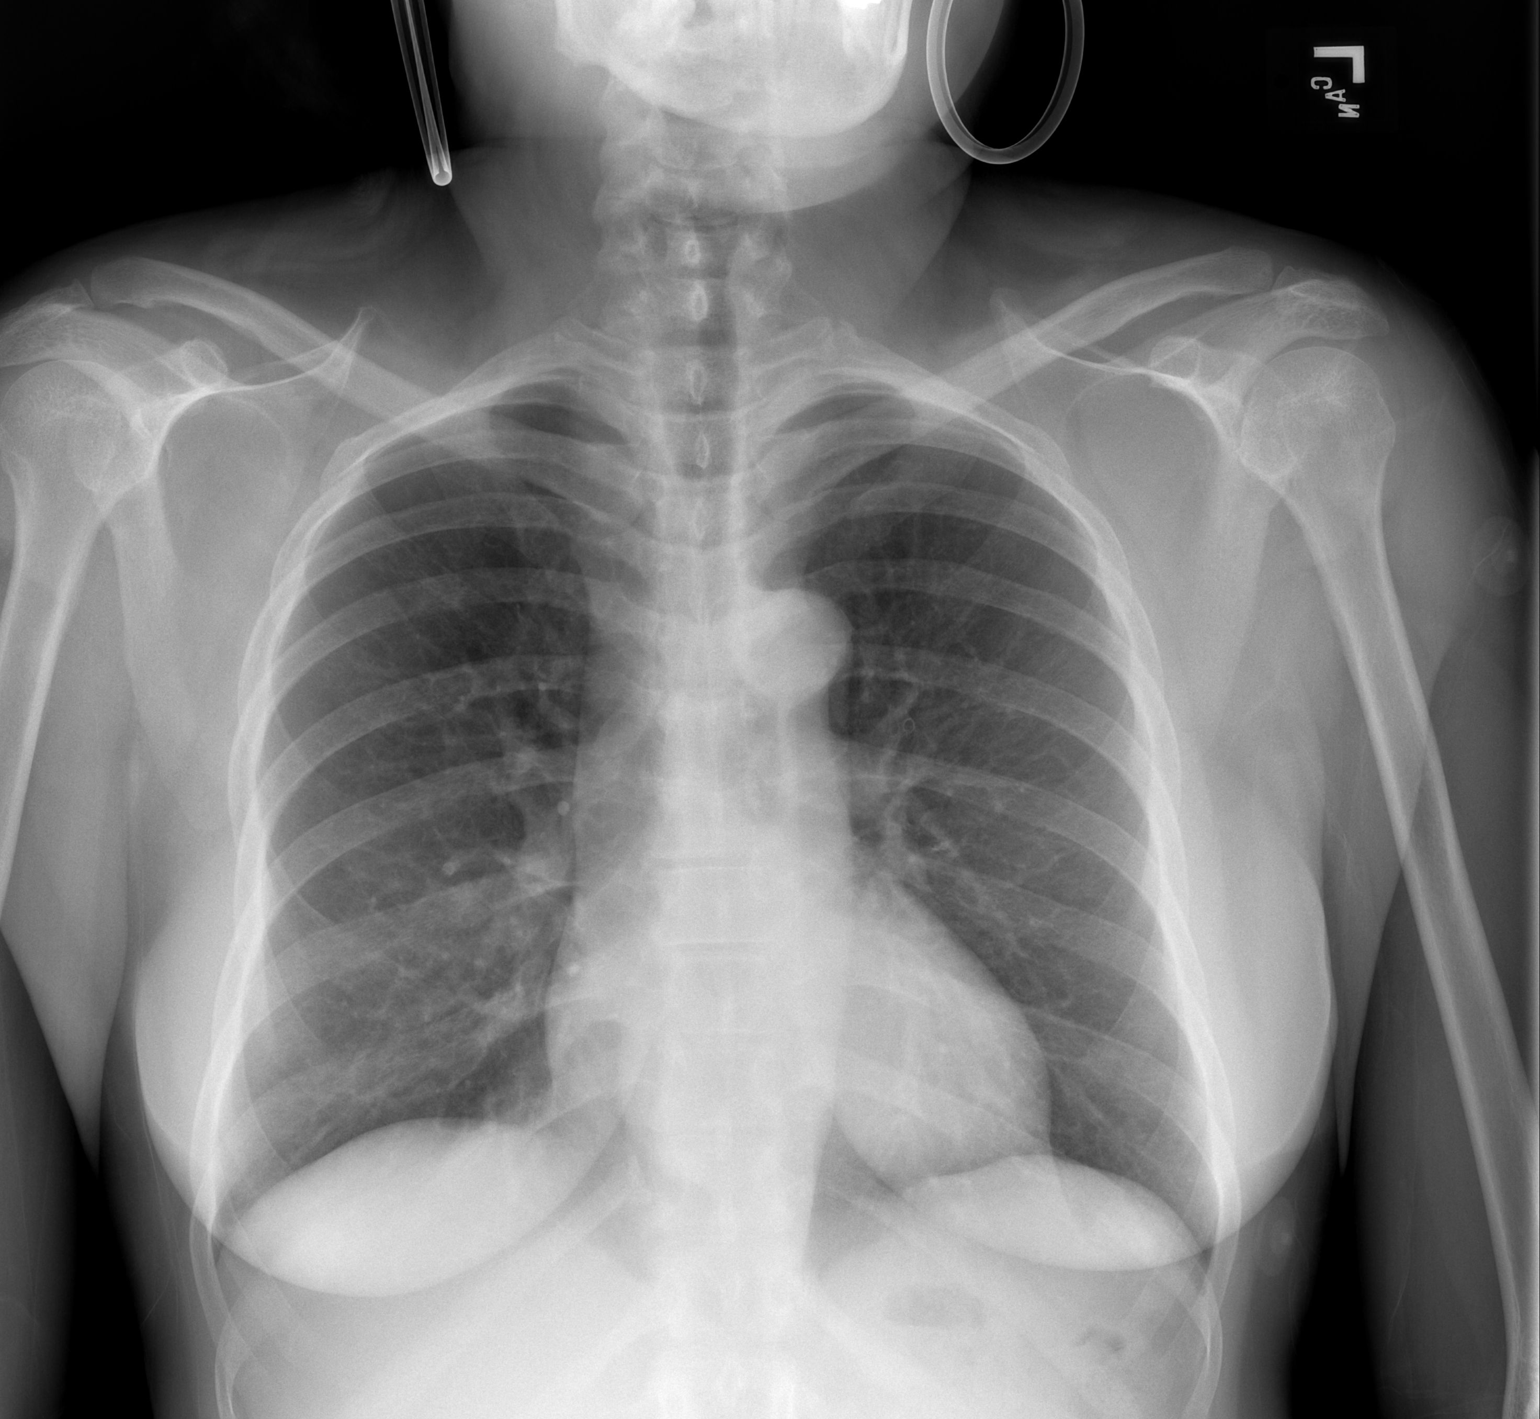

[w chest lat]
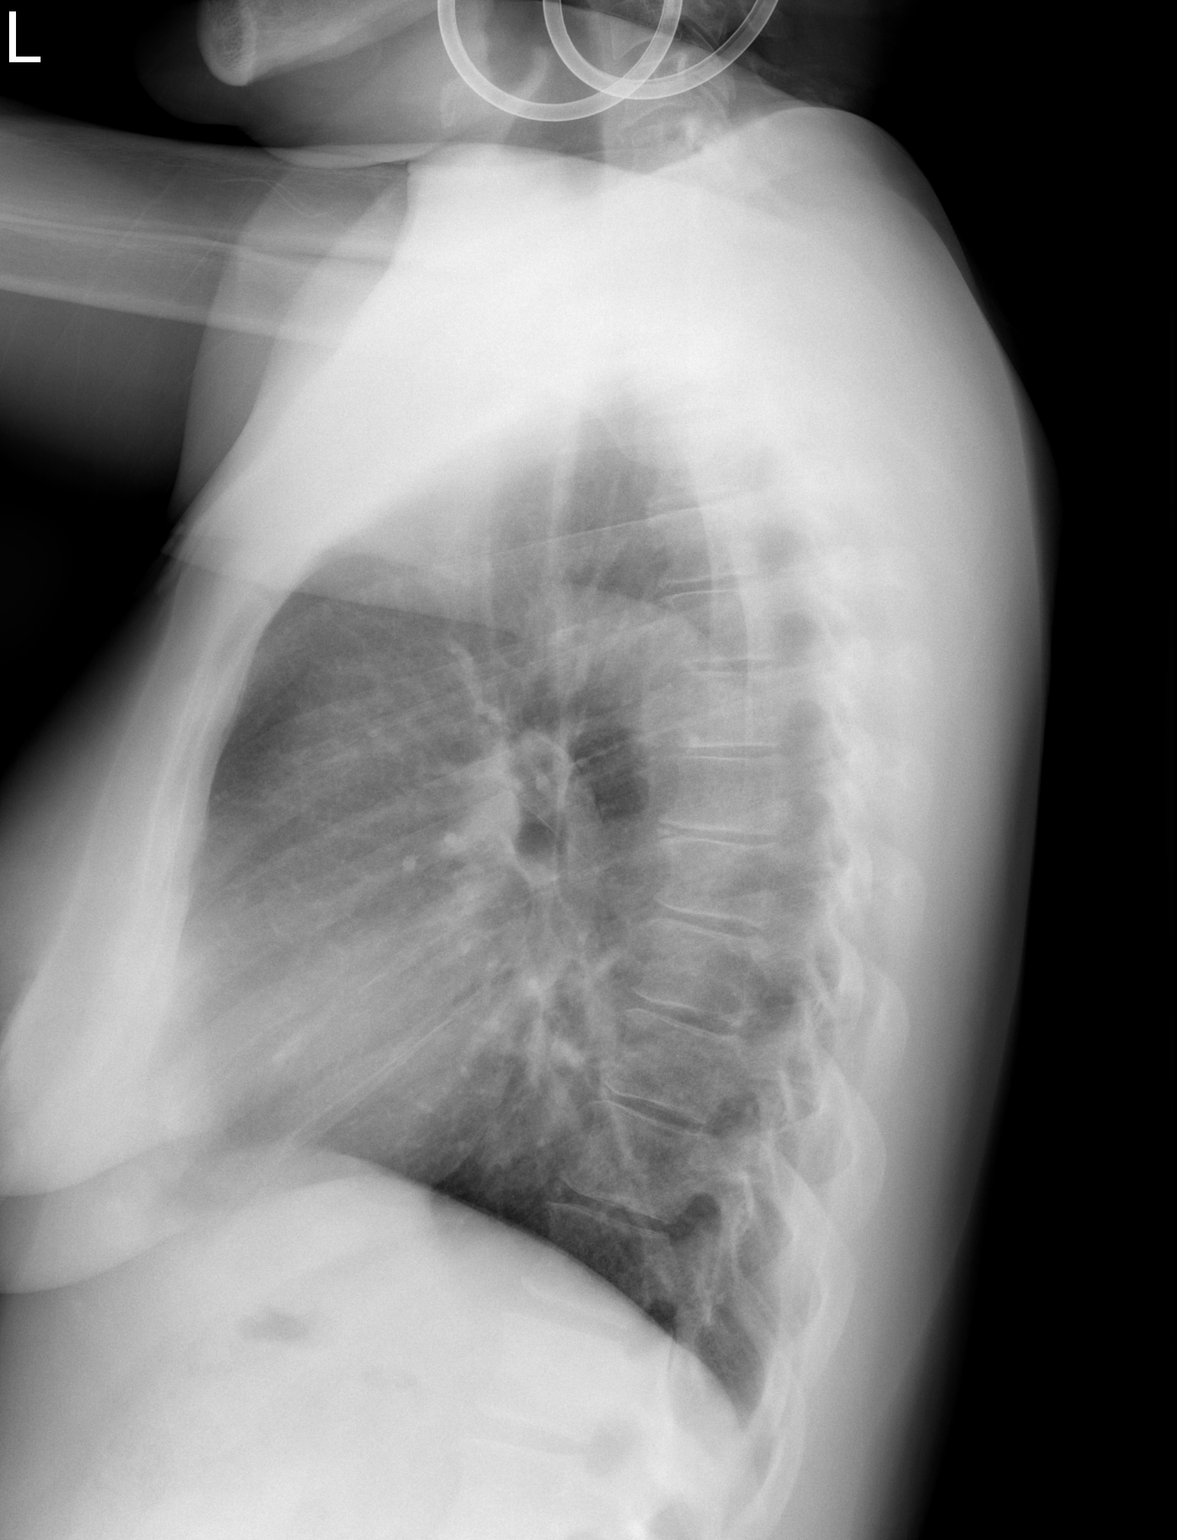

[2 of 2 positions shown; findings below may reference images not displayed]

FINDINGS: The heart size and mediastinal contours are within normal limits.
Both lungs are clear. No pneumothorax or pleural effusion is noted.
The visualized skeletal structures are unremarkable.
IMPRESSION: No active cardiopulmonary disease.

## 2018-03-14 ENCOUNTER — Encounter: Payer: Self-pay | Admitting: Neurology

## 2018-03-16 ENCOUNTER — Telehealth: Payer: Self-pay | Admitting: Neurology

## 2018-03-16 ENCOUNTER — Ambulatory Visit: Payer: BLUE CROSS/BLUE SHIELD | Admitting: Neurology

## 2018-03-16 NOTE — Telephone Encounter (Signed)
Pt was a no show to apt today.  

## 2018-03-19 ENCOUNTER — Encounter: Payer: Self-pay | Admitting: Neurology

## 2018-04-19 IMAGING — CR DG CHEST 2V
2 series · 2 of 2 positions shown · non-contrast
Comparison: Chest radiograph August 13, 2016

CLINICAL DATA: Worsening LEFT arm and LEFT chest pain for 2 weeks.

EXAM:
CHEST  2 VIEW

[w chest pa]
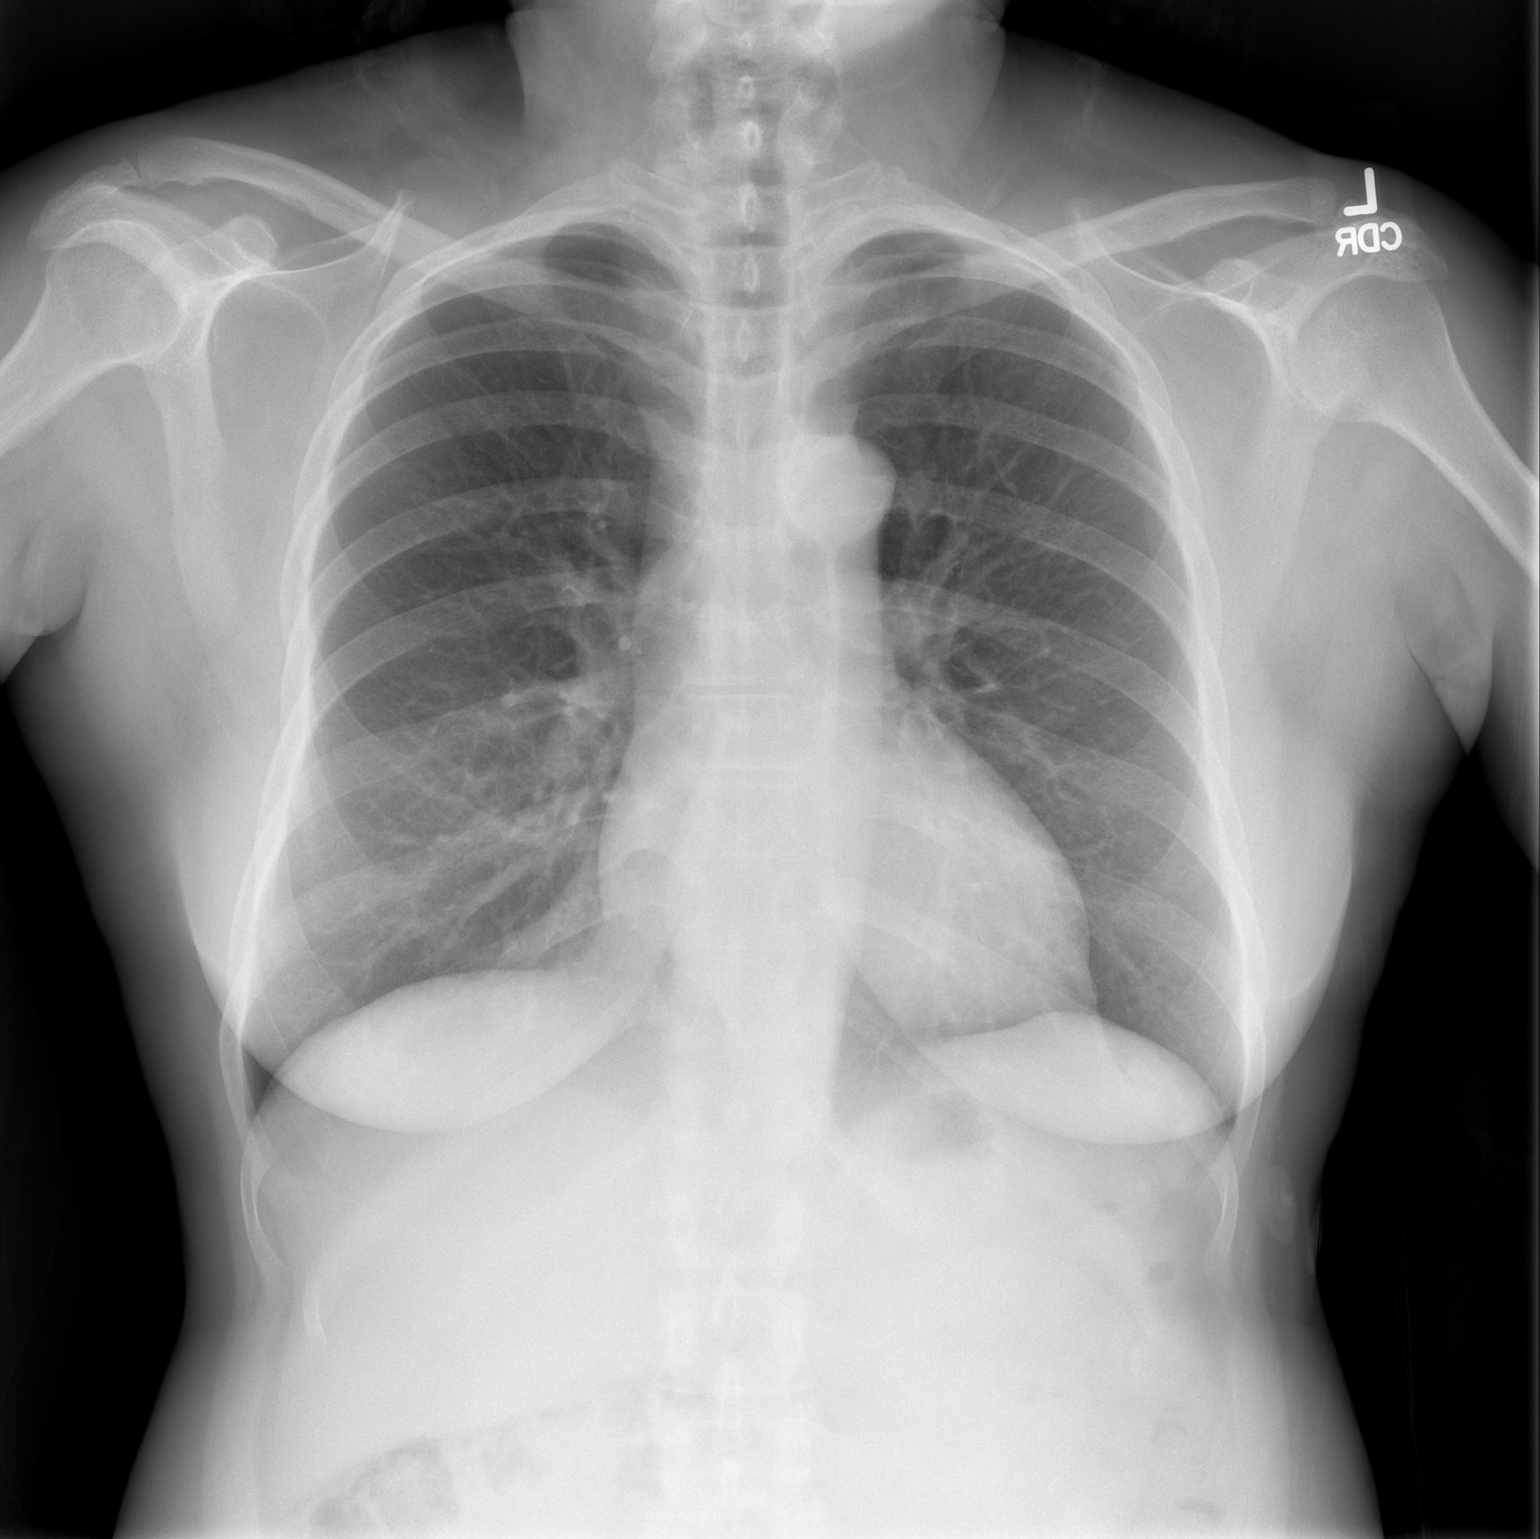

[w chest lat]
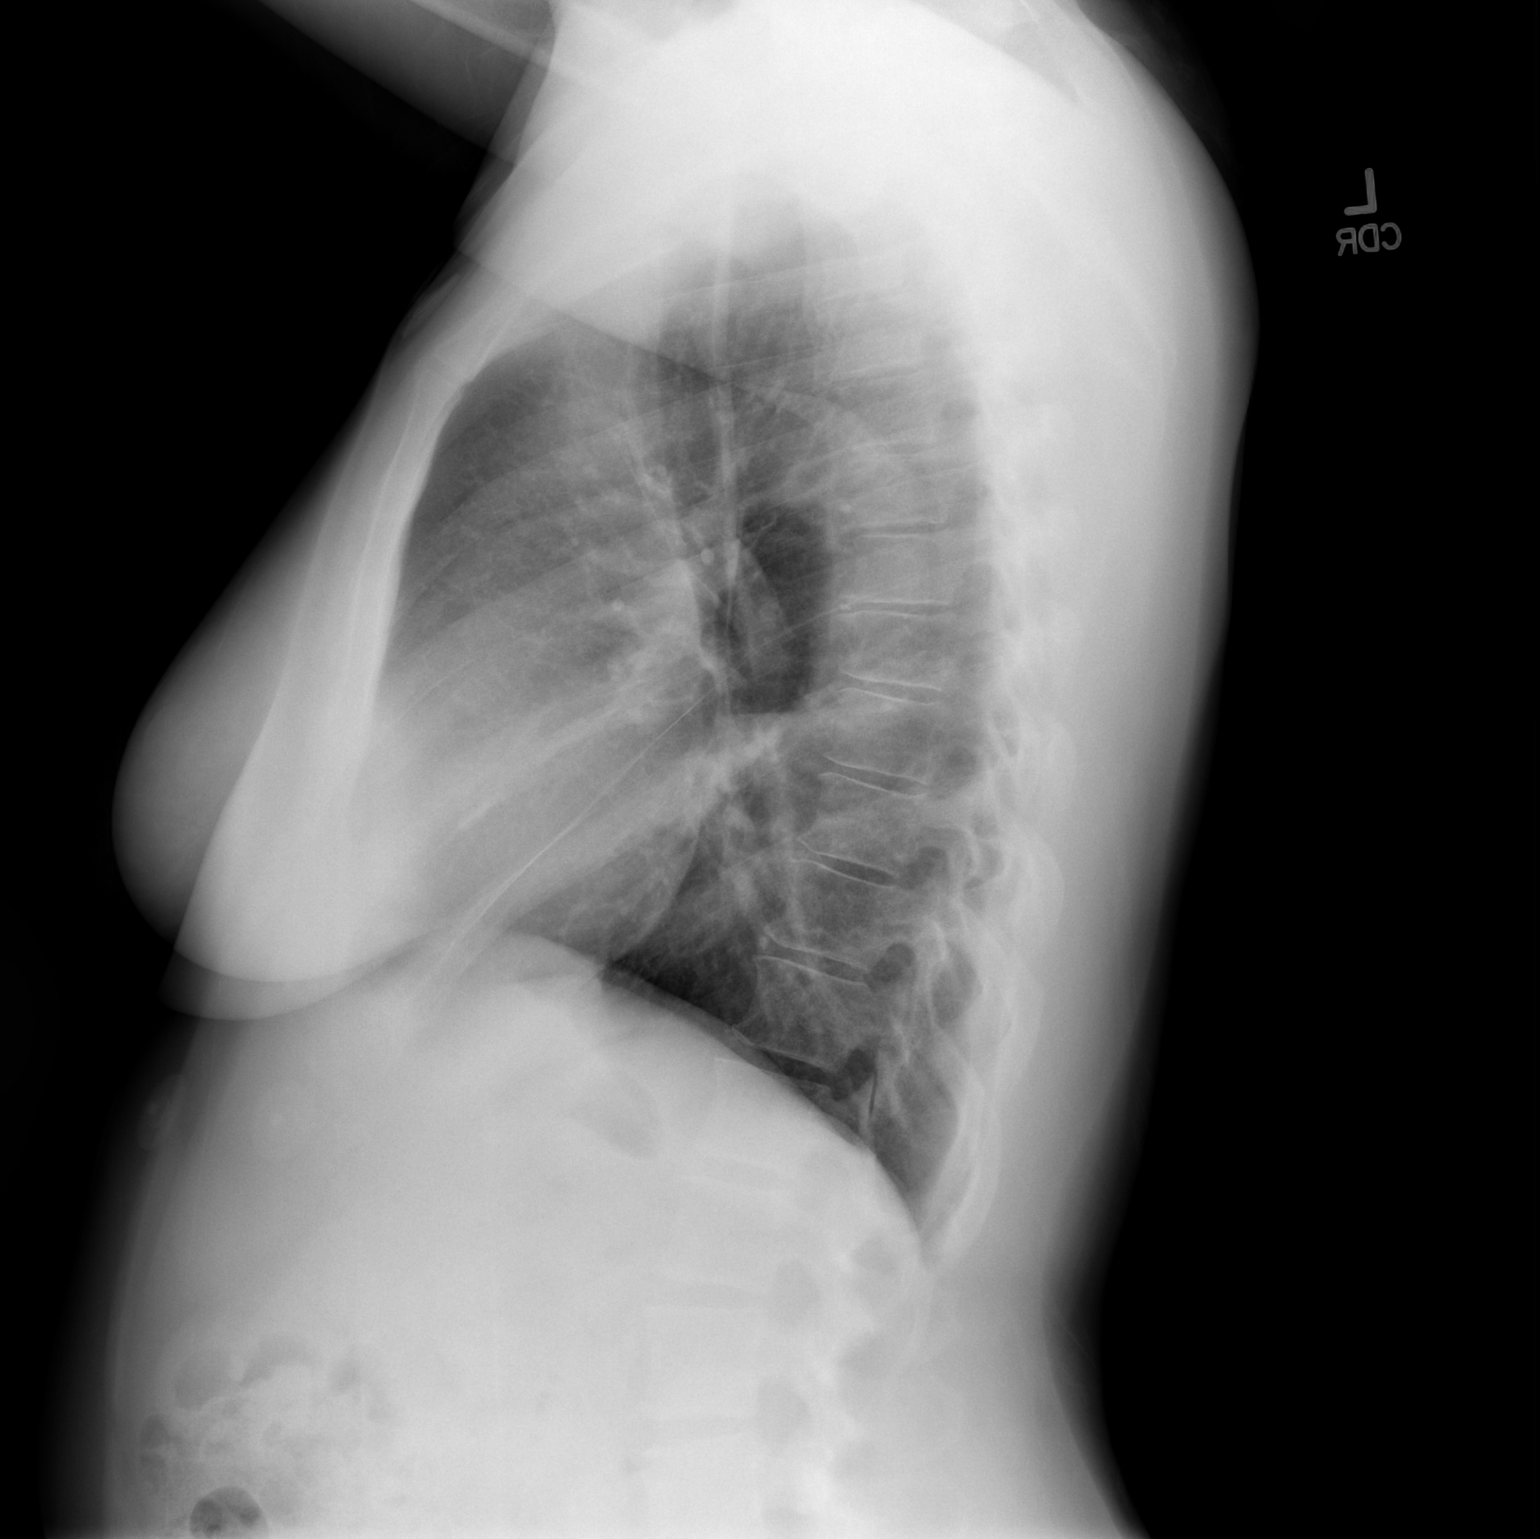

[2 of 2 positions shown; findings below may reference images not displayed]

FINDINGS: Cardiomediastinal silhouette is normal. No pleural effusions or
focal consolidations. Trachea projects midline and there is no
pneumothorax. Soft tissue planes and included osseous structures are
non-suspicious.
IMPRESSION: Stable examination:  Normal chest.

## 2018-11-21 ENCOUNTER — Encounter: Payer: Self-pay | Admitting: Neurology

## 2018-11-21 ENCOUNTER — Encounter: Payer: Self-pay | Admitting: *Deleted

## 2018-11-21 ENCOUNTER — Ambulatory Visit: Payer: BLUE CROSS/BLUE SHIELD | Admitting: Neurology

## 2018-11-21 VITALS — BP 143/97 | HR 95 | Ht 62.0 in | Wt 135.2 lb

## 2018-11-21 DIAGNOSIS — M542 Cervicalgia: Secondary | ICD-10-CM

## 2018-11-21 DIAGNOSIS — R7303 Prediabetes: Secondary | ICD-10-CM

## 2018-11-21 DIAGNOSIS — E538 Deficiency of other specified B group vitamins: Secondary | ICD-10-CM

## 2018-11-21 DIAGNOSIS — R200 Anesthesia of skin: Secondary | ICD-10-CM

## 2018-11-21 DIAGNOSIS — M5416 Radiculopathy, lumbar region: Secondary | ICD-10-CM

## 2018-11-21 DIAGNOSIS — R29898 Other symptoms and signs involving the musculoskeletal system: Secondary | ICD-10-CM

## 2018-11-21 DIAGNOSIS — M5441 Lumbago with sciatica, right side: Secondary | ICD-10-CM | POA: Diagnosis not present

## 2018-11-21 DIAGNOSIS — G8929 Other chronic pain: Secondary | ICD-10-CM | POA: Insufficient documentation

## 2018-11-21 DIAGNOSIS — R258 Other abnormal involuntary movements: Secondary | ICD-10-CM

## 2018-11-21 DIAGNOSIS — R202 Paresthesia of skin: Secondary | ICD-10-CM | POA: Diagnosis not present

## 2018-11-21 DIAGNOSIS — G959 Disease of spinal cord, unspecified: Secondary | ICD-10-CM

## 2018-11-21 DIAGNOSIS — G8928 Other chronic postprocedural pain: Secondary | ICD-10-CM

## 2018-11-21 DIAGNOSIS — M7918 Myalgia, other site: Secondary | ICD-10-CM

## 2018-11-21 DIAGNOSIS — R531 Weakness: Secondary | ICD-10-CM | POA: Diagnosis not present

## 2018-11-21 NOTE — Patient Instructions (Signed)
MRI cervical spine and lumbar spine EMG/NCS Labs today Physical therapy Dr. Ethelene Hal for procedures   Electromyoneurogram Electromyoneurogram is a test to check how well your muscles and nerves are working. This procedure includes the combined use of electromyogram (EMG) and nerve conduction study (NCS). EMG is used to look for muscular disorders. NCS, which is also called electroneurogram, measures how well your nerves are controlling your muscles. The procedures are usually performed together to check if your muscles and nerves are healthy. If the reaction to testing is abnormal, this can indicate disease or injury, such as peripheral nerve damage. Tell a health care provider about:  Any allergies you have.  All medicines you are taking, including vitamins, herbs, eye drops, creams, and over-the-counter medicines.  Any problems you or family members have had with anesthetic medicines.  Any blood disorders you have.  Any surgeries you have had.  Any medical conditions you have.  Any pacemaker you have. What are the risks? Generally, this is a safe procedure. However, problems may occur, including:  Infection where the electrodes were inserted.  Bleeding. What happens before the procedure?  Ask your health care provider about: ? Changing or stopping your regular medicines. This is especially important if you are taking diabetes medicines or blood thinners. ? Taking medicines such as aspirin and ibuprofen. These medicines can thin your blood. Do not take these medicines before your procedure if your health care provider instructs you not to.  Your health care provider may ask you to avoid: ? Caffeine, such as coffee and tea. ? Nicotine. This includes cigarettes and anything with tobacco.  Do not use lotions or creams on the same day that you will be having the procedure. What happens during the procedure? For EMG:  Your health care provider will ask you to stay in a position  so that he or she can access the muscle that will be studied. You may be standing, sitting down, or lying down.  You may be given a medicine that numbs the area (local anesthetic).  A very thin needle that has an electrode on it will be inserted into your muscle.  Another small electrode will be placed on your skin near the muscle.  Your health care provider will ask you to continue to remain still.  The electrodes will send a signal that tells about the electrical activity of your muscles. You may see this on a monitor or hear it in the room.  After your muscles have been studied at rest, your health care provider will ask you to contract or flex your muscles. The electrodes will send a signal that tells about the electrical activity of your muscles.  Your health care provider will remove the electrodes and the electrode needles when the procedure is finished. The procedure may vary among health care providers and hospitals. For NCS:  An electrode that records your nerve activity (recording electrode) will be placed on your skin by the muscle that is being studied.  An electrode that is used as a reference (reference electrode) will be placed near the recording electrode.  A paste or gel will be applied to your skin between the recording electrode and the reference electrode.  Your nerve will be stimulated with a mild shock. Your health care provider will measure how much time it takes for your muscle to react.  Your health care provider will remove the electrodes and the gel when the procedure is finished. The procedure may vary among health care providers  and hospitals. What happens after the procedure?  It is your responsibility to obtain your test results. Ask your health care provider or the department performing the test when and how you will get your results.  Your health care provider may: ? Give you medicines for any pain. ? Monitor the insertion sites to make sure that  they stop bleeding. This information is not intended to replace advice given to you by your health care provider. Make sure you discuss any questions you have with your health care provider. Document Released: 02/03/2005 Document Revised: 03/10/2016 Document Reviewed: 11/24/2014 [employer] Interactive Patient Education  2019 [employer]

## 2018-11-21 NOTE — Progress Notes (Addendum)
[facility]    Provider:  Dr Lucia Gaskins Referring Provider: Coralee Rud, PA-C Primary Care Provider:  Coralee Rud, PA-C  CC:  tingling in fingers, toes, numb; pain in right knee down to feet, numbness  HPI:  Natasha Hickman is a 53 y.o. female here as requested by provider Coralee Rud, PA-C for degenerative disk disease. She has chronic hx of low back pain and other joint related pain issues and has seen neurologists in the past and has had extensive imaging(see Care Everywhere) of back, upper extremities and shoulders (MRIs), XRs of shoulders, MRIs of lower extremities,xr of femur and spine.    She has chronic tingling in the fingers and the feet. They feel numb. She has significant neck pain. She has lower back pain. She has had "bulging disks for a long time".  The numbness is in the fingers, worse in the morning, wakes her up with numbness. Weakness sin the hands. She denies radicular symptoms.  She also has low back pain, radiates into the calf and all the way down to the feet. te right is worse, in the left foot the feet are numb. She has pain in the low back, worse with sitting long periods. She was seeing a prior neurologist in [facility]. She never had an emg/ncs or maybe more than 10 years ago. She types a lot. She sits all day. Worse when laying bed. She had injection sin the lumbar spine, she has been under the care of a physician for years, tried injections, muscle relaxers, OTC NSAIDs, heat and stretching. Injections helped for a little bit.   Reviewed notes, labs and imaging from outside physicians, which showed:  Reviewed reports from Epic MRI Lumbar Spine 2015 report from [facility]:   Abnormal MRI of the lumbar spine performed without  contrast. There is a small broad-based disc protrusion at L5-S1 with probable annular tear and a small annular tear at L3-L4 in the midline. There is no nerve root impingement   04/2015: AP and lateral x-ray of the  left femur show no fracture dislocation subluxation or tumor. Tension was made to the cortices of the midshaft of the femur and distal femur. No periosteal elevation or lytic areas  MRI 05/2015: IMPRESSION: No acute abnormality or finding to explain the patient's symptoms. The left thigh appears normal.  Review of Systems: Patient complains of symptoms per HPI as well as the following symptoms: numbness, tingling, weakness. Pertinent negatives and positives per HPI. All others negative.   Social History   Socioeconomic History  . Marital status: Married    Spouse name: Not on file  . Number of children: 2  . Years of education: 67  . Highest education level: Not on file  Occupational History  . Not on file  Social Needs  . Financial resource strain: Not on file  . Food insecurity:    Worry: Not on file    Inability: Not on file  . Transportation needs:    Medical: Not on file    Non-medical: Not on file  Tobacco Use  . Smoking status: Current Every Day Smoker    Packs/day: 0.75    Types: Cigarettes  . Smokeless tobacco: Never Used  Substance and Sexual Activity  . Alcohol use: Yes    Alcohol/week: 0.0 standard drinks    Comment: sometimes  . Drug use: No  . Sexual activity: Not on file  Lifestyle  . Physical activity:    Days per week:  Not on file    Minutes per session: Not on file  . Stress: Not on file  Relationships  . Social connections:    Talks on phone: Not on file    Gets together: Not on file    Attends religious service: Not on file    Active member of club or organization: Not on file    Attends meetings of clubs or organizations: Not on file    Relationship status: Not on file  . Intimate partner violence:    Fear of current or ex partner: Not on file    Emotionally abused: Not on file    Physically abused: Not on file    Forced sexual activity: Not on file  Other Topics Concern  . Not on file  Social History Narrative   lives with husband    Caffeine- some tea    Family History  Problem Relation Age of Onset  . CAD Mother   . Hypertension Mother   . Diabetes Mother   . CAD Father   . Hypertension Father   . Hypertension Sister   . Diabetes Brother     Past Medical History:  Diagnosis Date  . Acid reflux   . Anxiety   . Barrett's esophagus   . Chronic kidney disease (CKD), stage III (moderate) (HCC)    11/21/18 pt denies  . DDD (degenerative disc disease), cervical   . Fatigue   . GERD (gastroesophageal reflux disease)   . Hiatal hernia   . High risk medication use   . Hypertension   . IBS (irritable bowel syndrome)    No longer have  . Migraines   . Mixed hyperlipidemia     Patient Active Problem List   Diagnosis Date Noted  . Chronic right-sided low back pain with right-sided sciatica 11/21/2018  . Hand numbness 11/21/2018  . Lumbar radiculopathy 11/21/2018  . Ingrown nail 08/08/2014  . Pain in lower limb 08/08/2014  . Abdominal pain 06/28/2013  . HEADACHE 01/26/2009  . GERD 01/09/2009  . ALLERGIC RHINITIS 01/06/2009  . DIARRHEA 01/06/2009  . UNSPECIFIED DISORDER OF ESOPHAGUS 12/15/2008  . ANXIETY STATE, UNSPECIFIED 11/18/2008  . TOBACCO ABUSE 11/18/2008  . HYPERTENSION 11/18/2008  . DYSPNEA 11/18/2008    Past Surgical History:  Procedure Laterality Date  . ABDOMINAL HYSTERECTOMY    . ESOPHAGOGASTRIC FUNDOPLASTY    . GASTRECTOMY    . SHOULDER SURGERY Right    arthroscopic     Current Outpatient Medications  Medication Sig Dispense Refill  . amLODipine-olmesartan (AZOR) 5-20 MG per tablet Take 1 tablet by mouth daily.      Marland Kitchen. BLACK CURRANT SEED OIL PO Take by mouth.    . clonazePAM (KLONOPIN) 0.5 MG tablet Take 0.5 mg by mouth 2 (two) times daily as needed.    . dicyclomine (BENTYL) 20 MG tablet Take 1 tablet (20 mg total) by mouth 2 (two) times daily. 20 tablet 0  . esomeprazole (NEXIUM) 40 MG capsule Take 40 mg by mouth daily before breakfast.    . ferrous sulfate 325 (65 FE) MG tablet  Take 325 mg by mouth 2 (two) times daily with a meal.    . fish oil-omega-3 fatty acids 1000 MG capsule Take 1 g by mouth 3 (three) times daily.    . Flaxseed, Linseed, (FLAXSEED OIL) 1000 MG CAPS Take 1 capsule by mouth 2 (two) times daily.    . fluticasone (FLONASE) 50 MCG/ACT nasal spray 1-2 SPRAY, NASAL, IN EACH NOSTRIL DAILY    .  gabapentin (NEURONTIN) 100 MG capsule Take 100 mg by mouth 3 (three) times daily.    . Garlic 1000 MG CAPS Take 1 capsule by mouth daily.    . Multiple Vitamins-Minerals (MULTIVITAMIN WITH MINERALS) tablet Take 1 tablet by mouth daily.      . RABEprazole (ACIPHEX) 20 MG tablet Take by mouth.    . ALPRAZolam (XANAX) 0.25 MG tablet Take 0.125 mg by mouth once as needed. For anxiety    . cyclobenzaprine (FLEXERIL) 10 MG tablet Take 10 mg by mouth 3 (three) times daily as needed for muscle spasms.    . DOCOSAHEXAENOIC ACID PO Take 1 g by mouth.    . meloxicam (MOBIC) 15 MG tablet Take 1 tablet (15 mg total) by mouth daily. TAKE WITH MEALS (Patient not taking: Reported on 11/21/2018) 30 tablet 0  . methocarbamol (ROBAXIN) 500 MG tablet Take 1 tablet (500 mg total) by mouth 3 (three) times daily between meals as needed. (Patient not taking: Reported on 11/21/2018) 20 tablet 0  . naproxen (NAPROSYN) 500 MG tablet Take 1 tablet (500 mg total) by mouth 2 (two) times daily. (Patient not taking: Reported on 11/21/2018) 30 tablet 0  . neomycin-polymyxin-gramicidin (NEOSPORIN) 1.75-10000-.025 ophthalmic solution Place 1-2 drops into the right eye 4 (four) times daily.    . Vitamin D, Ergocalciferol, (DRISDOL) 50000 units CAPS capsule Take 50,000 Units by mouth every 7 (seven) days.     No current facility-administered medications for this visit.     Allergies as of 11/21/2018 - Review Complete 11/21/2018  Allergen Reaction Noted  . Adhesive [tape] Other (See Comments) 12/07/2011  . Meperidine hcl Swelling   . Percocet [oxycodone-acetaminophen] Swelling 06/17/2011  . Septra  [bactrim] Rash 06/17/2011    Vitals: BP (!) 143/97   Pulse 95   Ht 5\' 2"  (1.575 m)   Wt 135 lb 3.2 oz (61.3 kg)   BMI 24.73 kg/m  Last Weight:  Wt Readings from Last 1 Encounters:  11/21/18 135 lb 3.2 oz (61.3 kg)   Last Height:   Ht Readings from Last 1 Encounters:  11/21/18 5\' 2"  (1.575 m)     Physical exam: Exam: Gen: NAD, conversant, well nourised, well groomed                     CV: RRR, no MRG. No Carotid Bruits. No peripheral edema, warm, nontender Eyes: Conjunctivae clear without exudates or hemorrhage  Neuro: Detailed Neurologic Exam  Speech:    Speech is normal; fluent and spontaneous with normal comprehension.  Cognition:    The patient is oriented to person, place, and time;     recent and remote memory intact;     language fluent;     normal attention, concentration,     fund of knowledge Cranial Nerves:    The pupils are equal, round, and reactive to light. The fundi are normal and spontaneous venous pulsations are present. Visual fields are full to finger confrontation. Extraocular movements are intact. Trigeminal sensation is intact and the muscles of mastication are normal. The face is symmetric. The palate elevates in the midline. Hearing intact. Voice is normal. Shoulder shrug is normal. The tongue has normal motion without fasciculations.   Coordination:    Normal finger to nose and heel to shin. Normal rapid alternating movements.   Gait:    Heel-toe and tandem gait are normal.   Motor Observation:    No asymmetry, no atrophy, and no involuntary movements noted. Tone:  Normal muscle tone.    Posture:    Posture is normal. normal erect    Strength: 5-/5 leg flexion otherwise strength is V/V in the upper and lower limbs.      Sensation: intact to LT, pin prick distally in the feet.      Reflex Exam:  DTR's:    Deep tendon reflexes in the upper and lower extremities are brisk bilaterally with  Toes:    The toes are downgoing  bilaterally.   Clonus:    + clonus left 2 beats AJ1    +Phalen's maneuver  Assessment/Plan:  53 y.o. female here as requested by provider Coralee Rud, PA-C for degenerative disk disease. She has chronic hx of low back pain and other joint related pain issues and has seen neurologists in the past and has had extensive hx of imaging(see Care Everywhere) of back, upper extremities and shoulders (MRIs), XRs of shoulders, MRIs of lower extremities,xr of femur and spine.    - Upper extremity symptoms clinically sound like CTS: EMG/NCS bilateral upper extremities (will also do the lower) - Lumbar radic vs distal polyneuropathy or both: EMG/NCS of the bilateral lower extremities(start witht he right leg more symptomatic and if abnormal can proceed to the left leg). MRI of the lumbar spine, she has not had that since 2015 and has tried and failed conservative therapies including lumbar injections and continues to have  - Physical therapy: chronic low back pain, spasms, tightness. Please include dry needling.[place] in [place] - Dr. Ethelene Hal for interventional procedures of the lower spine - MRI of the cervical spine due to upper motor neuron signs, chronic neck pain, clonus, paresthesias and leg weakness. need to evaluate for cervical myelopathy.     Orders Placed This Encounter  Procedures  . MR LUMBAR SPINE WO CONTRAST    Standing Status:   Future    Number of Occurrences:   1    Standing Expiration Date:   01/20/2020    Order Specific Question:   What is the patient's sedation requirement?    Answer:   No Sedation    Order Specific Question:   Does the patient have a pacemaker or implanted devices?    Answer:   No    Order Specific Question:   Preferred imaging location?    Answer:   External    Order Specific Question:   Radiology Contrast Protocol - do NOT remove file path    Answer:   \\[email or web address]  . MR CERVICAL SPINE WO CONTRAST    Standing  Status:   Future    Number of Occurrences:   1    Standing Expiration Date:   01/20/2020    Order Specific Question:   What is the patient's sedation requirement?    Answer:   No Sedation    Order Specific Question:   Does the patient have a pacemaker or implanted devices?    Answer:   No    Order Specific Question:   Preferred imaging location?    Answer:   External    Order Specific Question:   Radiology Contrast Protocol - do NOT remove file path    Answer:   \\[email or web address]\[email or web address].PDF  . B12 and Folate Panel  . Hemoglobin A1c  . Comprehensive metabolic panel  . CBC  . Methylmalonic acid, serum  . Ambulatory referral to Physical Therapy    Referral Priority:   Routine    Referral Type:   Physical Medicine    Referral  Reason:   Specialty Services Required    Requested Specialty:   Physical Therapy    Number of Visits Requested:   1  . Ambulatory referral to Orthopedic Surgery    Referral Priority:   Routine    Referral Type:   Surgical    Referral Reason:   Specialty Services Required    Referred to Provider:   Patience Muscaamos, Stephen W, PA    Requested Specialty:   Orthopedic Surgery    Number of Visits Requested:   1  . Ambulatory referral to Orthopedic Surgery    Referral Priority:   Routine    Referral Type:   Surgical    Referral Reason:   Specialty Services Required    Requested Specialty:   Orthopedic Surgery    Number of Visits Requested:   1  . NCV with EMG(electromyography)    Standing Status:   Future    Standing Expiration Date:   11/21/2019   No orders of the defined types were placed in this encounter.   Cc: Ronnald Collumuran, Michael R, PA-C,    Naomie DeanAntonia Ahern, MD  [facility] [address] [place], [place] [id number]  Phone [phone] Fax [phone]

## 2018-11-22 ENCOUNTER — Telehealth: Payer: Self-pay | Admitting: *Deleted

## 2018-11-22 NOTE — Telephone Encounter (Signed)
I called the patient and discussed her lab results. She verbalized understanding. Pt aware ins Natasha Hickman is pending and she will get a call afterward to schedule. Her EMG/NCV is scheduled for 12/13/2018. She had no further questions.

## 2018-11-22 NOTE — Telephone Encounter (Signed)
-----   Message from Anson Fret, MD sent at 11/22/2018 10:39 AM EST ----- She is pre-diabetic, this is not a new finding. Otherwise look fine thanks.

## 2018-11-23 LAB — COMPREHENSIVE METABOLIC PANEL
ALK PHOS: 66 IU/L (ref 39–117)
ALT: 16 IU/L (ref 0–32)
AST: 19 IU/L (ref 0–40)
Albumin/Globulin Ratio: 1.8 (ref 1.2–2.2)
Albumin: 4.6 g/dL (ref 3.8–4.9)
BUN/Creatinine Ratio: 18 (ref 9–23)
BUN: 13 mg/dL (ref 6–24)
Bilirubin Total: 0.3 mg/dL (ref 0.0–1.2)
CALCIUM: 10 mg/dL (ref 8.7–10.2)
CO2: 21 mmol/L (ref 20–29)
CREATININE: 0.72 mg/dL (ref 0.57–1.00)
Chloride: 105 mmol/L (ref 96–106)
GFR calc Af Amer: 112 mL/min/{1.73_m2} (ref >59)
GFR calc non Af Amer: 97 mL/min/{1.73_m2} (ref >59)
GLOBULIN, TOTAL: 2.6 g/dL (ref 1.5–4.5)
Glucose: 105 mg/dL — ABNORMAL HIGH (ref 65–99)
Potassium: 4.5 mmol/L (ref 3.5–5.2)
SODIUM: 145 mmol/L — AB (ref 134–144)
Total Protein: 7.2 g/dL (ref 6.0–8.5)

## 2018-11-23 LAB — B12 AND FOLATE PANEL
FOLATE: 11.8 ng/mL (ref >3.0)
Vitamin B-12: 624 pg/mL (ref 232–1245)

## 2018-11-23 LAB — CBC
HEMOGLOBIN: 13.4 g/dL (ref 11.1–15.9)
Hematocrit: 38.9 % (ref 34.0–46.6)
MCH: 31.1 pg (ref 26.6–33.0)
MCHC: 34.4 g/dL (ref 31.5–35.7)
MCV: 90 fL (ref 79–97)
PLATELETS: 213 10*3/uL (ref 150–450)
RBC: 4.31 x10E6/uL (ref 3.77–5.28)
RDW: 13 % (ref 11.7–15.4)
WBC: 3.8 10*3/uL (ref 3.4–10.8)

## 2018-11-23 LAB — METHYLMALONIC ACID, SERUM: METHYLMALONIC ACID: 86 nmol/L (ref 0–378)

## 2018-11-23 LAB — HEMOGLOBIN A1C
ESTIMATED AVERAGE GLUCOSE: 126 mg/dL
Hgb A1c MFr Bld: 6 % — ABNORMAL HIGH (ref 4.8–5.6)

## 2018-11-26 ENCOUNTER — Telehealth: Payer: Self-pay | Admitting: Neurology

## 2018-11-26 NOTE — Telephone Encounter (Signed)
MR Cervical spine wo contrast & MR Lumbar spine wo contrast Dr. Valaria Good Auth: NPR Ref # [id number]. Patient is scheduled at [facility] for 11/27/18

## 2018-11-27 ENCOUNTER — Telehealth: Payer: Self-pay | Admitting: Neurology

## 2018-11-27 ENCOUNTER — Ambulatory Visit (INDEPENDENT_AMBULATORY_CARE_PROVIDER_SITE_OTHER): Payer: BLUE CROSS/BLUE SHIELD

## 2018-11-27 DIAGNOSIS — R200 Anesthesia of skin: Secondary | ICD-10-CM

## 2018-11-27 DIAGNOSIS — R202 Paresthesia of skin: Secondary | ICD-10-CM | POA: Diagnosis not present

## 2018-11-27 DIAGNOSIS — R258 Other abnormal involuntary movements: Secondary | ICD-10-CM

## 2018-11-27 DIAGNOSIS — G8928 Other chronic postprocedural pain: Secondary | ICD-10-CM

## 2018-11-27 DIAGNOSIS — R531 Weakness: Secondary | ICD-10-CM

## 2018-11-27 DIAGNOSIS — G959 Disease of spinal cord, unspecified: Secondary | ICD-10-CM

## 2018-11-27 DIAGNOSIS — R29898 Other symptoms and signs involving the musculoskeletal system: Secondary | ICD-10-CM

## 2018-11-27 DIAGNOSIS — M5441 Lumbago with sciatica, right side: Secondary | ICD-10-CM

## 2018-11-27 DIAGNOSIS — G8929 Other chronic pain: Secondary | ICD-10-CM

## 2018-11-27 DIAGNOSIS — M542 Cervicalgia: Secondary | ICD-10-CM

## 2018-11-27 NOTE — Telephone Encounter (Signed)
Patient came in for MRI today and has questions about her referral. She requested that the nurse call her to discuss these questions.

## 2018-11-27 NOTE — Telephone Encounter (Signed)
See other phone note

## 2018-11-27 NOTE — Addendum Note (Signed)
Addended by: Naomie Dean B on: 11/27/2018 01:16 PM   Modules accepted: Orders

## 2018-11-27 NOTE — Telephone Encounter (Signed)
Pt is asking for a call from RN to determine what type of shot is Dr Lucia Gaskins referring her for. Pt is asking what type of  injection or is it a steroid, please call on work#

## 2018-11-27 NOTE — Telephone Encounter (Signed)
I spoke with the patient and answered her questions about the referral. She will proceed with referral to Dr. Ethelene Halamos to discuss pain procedure/injections. She is aware that it may take a few days for MRI results. We will call when they are ready. She verbalized appreciation for the call.

## 2018-11-29 ENCOUNTER — Telehealth: Payer: Self-pay | Admitting: Neurology

## 2018-11-29 NOTE — Telephone Encounter (Signed)
Pt has called asking for MRI results from Monday of this week.  Pt states Dr Ramos(which is who pt was referred to by Dr Lucia Gaskins) is asking for the results just as soon as they are available.

## 2018-11-29 NOTE — Telephone Encounter (Signed)
I called the patient and LVM (ok per DPR) with the results of her MRI c-spine & lumbar spine. Left office number and hours in message for call back if she has any questions. Advised that the images can be seen by Dr. Ethelene Hal at any time and that Dr. Lucia Gaskins said she would send the results to him to review.

## 2018-11-29 NOTE — Telephone Encounter (Signed)
They were both largely unremarkable. She has some disc bulging at multiple levels of the cervical spine but nothing causing spinal stenosis or foraminal stenosis. The MRI of her lumbar spine is essentially normal. Nothing seen to explain her symptoms. But I will send results to Dr. Ethelene Hal to review and see what he thinks - and he can also see the images on his end at any time. Thanks

## 2018-11-29 NOTE — Telephone Encounter (Addendum)
MRI c-spine & lumbar spine routed via fax method to Dr. Sheran Luz [phone].

## 2018-11-30 NOTE — Telephone Encounter (Signed)
I called the pt again and LVM (ok per DPR) to inquire if pt received message from yesterday. Asked for call back if she has any questions or did not receive my message and we can discuss the results. Informed pt that MRI c-spine & lumbar spine were faxed to Dr. Ethelene Hal. Left office number and hours in message for call back if needed.

## 2018-12-03 NOTE — Telephone Encounter (Signed)
I called the patient again and LVM asking for call back when she can. Left office number and hours in message.   The phone keeps going to vm after a couple of rings when I call pt.

## 2018-12-03 NOTE — Telephone Encounter (Signed)
Pt called back, states she has not rec'd any calls regarding results. She was advised RN has left message on VM 3 different days. Please call cell, number verified

## 2018-12-03 NOTE — Telephone Encounter (Signed)
Pt returned RN's call. She can be reached on her cell at [phone]

## 2018-12-03 NOTE — Telephone Encounter (Signed)
Also, I am calling [phone] per pt request.

## 2018-12-03 NOTE — Telephone Encounter (Signed)
Pt returned my call. Unfortunately she had not gotten any of the messages I had left. I reviewed her MRI C-spine & L-spine results from Dr. Lucia Gaskins as follows: They were both largely unremarkable. She has some disc bulging at multiple levels of the cervical spine but nothing causing spinal stenosis or foraminal stenosis. The MRI of her lumbar spine is essentially normal. Nothing seen to explain her symptoms. Advised pt that the results were sent to Dr. Ethelene Hal and he is able to see the images. Pt verbalized understanding and appreciation. Her questions were answered. She will plan on getting her EMG still on 12/13/2018 @ 8:15 unless she hears differently.

## 2018-12-03 NOTE — Telephone Encounter (Signed)
Called pt back and LVM asking for call back if she has any questions about the previous messages Ieft last week about her results.

## 2018-12-13 ENCOUNTER — Ambulatory Visit (INDEPENDENT_AMBULATORY_CARE_PROVIDER_SITE_OTHER): Payer: BLUE CROSS/BLUE SHIELD | Admitting: Neurology

## 2018-12-13 ENCOUNTER — Ambulatory Visit: Payer: BLUE CROSS/BLUE SHIELD | Admitting: Neurology

## 2018-12-13 DIAGNOSIS — G5603 Carpal tunnel syndrome, bilateral upper limbs: Secondary | ICD-10-CM | POA: Diagnosis not present

## 2018-12-13 DIAGNOSIS — Z0289 Encounter for other administrative examinations: Secondary | ICD-10-CM

## 2018-12-13 DIAGNOSIS — R202 Paresthesia of skin: Secondary | ICD-10-CM | POA: Diagnosis not present

## 2018-12-13 DIAGNOSIS — G629 Polyneuropathy, unspecified: Secondary | ICD-10-CM | POA: Diagnosis not present

## 2018-12-13 DIAGNOSIS — G8929 Other chronic pain: Secondary | ICD-10-CM

## 2018-12-13 DIAGNOSIS — M5441 Lumbago with sciatica, right side: Secondary | ICD-10-CM | POA: Diagnosis not present

## 2018-12-13 DIAGNOSIS — R531 Weakness: Secondary | ICD-10-CM

## 2018-12-13 DIAGNOSIS — R29898 Other symptoms and signs involving the musculoskeletal system: Secondary | ICD-10-CM

## 2018-12-13 DIAGNOSIS — R200 Anesthesia of skin: Secondary | ICD-10-CM

## 2018-12-13 NOTE — Patient Instructions (Signed)
Carpal Tunnel Syndrome    Carpal tunnel syndrome is a condition that causes pain in your hand and arm. The carpal tunnel is a narrow area located on the palm side of your wrist. Repeated wrist motion or certain diseases may cause swelling within the tunnel. This swelling pinches the main nerve in the wrist (median nerve).  What are the causes?  This condition may be caused by:  Repeated wrist motions.  Wrist injuries.  Arthritis.  A cyst or tumor in the carpal tunnel.  Fluid buildup during pregnancy.  Sometimes the cause of this condition is not known.  What increases the risk?  The following factors may make you more likely to develop this condition:  Having a job, such as being a butcher or a cashier, that requires you to repeatedly move your wrist in the same motion.  Being a woman.  Having certain conditions, such as:  Diabetes.  Obesity.  An underactive thyroid (hypothyroidism).  Kidney failure.  What are the signs or symptoms?  Symptoms of this condition include:  A tingling feeling in your fingers, especially in your thumb, index, and middle fingers.  Tingling or numbness in your hand.  An aching feeling in your entire arm, especially when your wrist and elbow are bent for a long time.  Wrist pain that goes up your arm to your shoulder.  Pain that goes down into your palm or fingers.  A weak feeling in your hands. You may have trouble grabbing and holding items.  Your symptoms may feel worse during the night.  How is this diagnosed?  This condition is diagnosed with a medical history and physical exam. You may also have tests, including:  Electromyogram (EMG). This test measures electrical signals sent by your nerves into the muscles.  Nerve conduction study. This test measures how well electrical signals pass through your nerves.  Imaging tests, such as X-rays, ultrasound, and MRI. These tests check for possible causes of your condition.  How is this treated?  This condition may be treated with:  Lifestyle  changes. It is important to stop or change the activity that caused your condition.  Doing exercise and activities to strengthen your muscles and bones (physical therapy).  Learning how to use your hand again after diagnosis (occupational therapy).  Medicines for pain and inflammation. This may include medicine that is injected into your wrist.  A wrist splint.  Surgery.  Follow these instructions at home:  If you have a splint:  Wear the splint as told by your health care provider. Remove it only as told by your health care provider.  Loosen the splint if your fingers tingle, become numb, or turn cold and blue.  Keep the splint clean.  If the splint is not waterproof:  Do not let it get wet.  Cover it with a watertight covering when you take a bath or shower.  Managing pain, stiffness, and swelling    If directed, put ice on the painful area:  If you have a removable splint, remove it as told by your health care provider.  Put ice in a plastic bag.  Place a towel between your skin and the bag.  Leave the ice on for 20 minutes, 2-3 times per day.  General instructions  Take over-the-counter and prescription medicines only as told by your health care provider.  Rest your wrist from any activity that may be causing your pain. If your condition is work related, talk with your employer about changes that   can be made, such as getting a wrist pad to use while typing.  Do any exercises as told by your health care provider, physical therapist, or occupational therapist.  Keep all follow-up visits as told by your health care provider. This is important.  Contact a health care provider if:  You have new symptoms.  Your pain is not controlled with medicines.  Your symptoms get worse.  Get help right away if:  You have severe numbness or tingling in your wrist or hand.  Summary  Carpal tunnel syndrome is a condition that causes pain in your hand and arm.  It is usually caused by repeated wrist motions.  Lifestyle changes and  medicines are used to treat carpal tunnel syndrome. Surgery may be recommended.  Follow your health care provider's instructions about wearing a splint, resting from activity, keeping follow-up visits, and calling for help.  This information is not intended to replace advice given to you by your health care provider. Make sure you discuss any questions you have with your health care provider.  Document Released: 09/30/2000 Document Revised: 02/09/2018 Document Reviewed: 02/09/2018  Elsevier Interactive Patient Education  2019 [employer]

## 2018-12-13 NOTE — Progress Notes (Signed)
See procedure note.

## 2018-12-13 NOTE — Progress Notes (Signed)
Patient presented to me earlier this month with multiple complaints including chronic low back pain, chronic neck pain, paresthesias and numbness in the hands and feet.  Reviewed work-up to date with patient.  EMG nerve conduction study consistent with left >than right mild to moderate carpal tunnel syndrome. Will refer to orthopaedics for treatment. Discussed conservative treatment for now.  Lower extremity normal however this does not exclude small fiber neuropathy which is consistent with her symptoms.  There was no suggestion of cervical radiculopathy or lumbar radiculopathy.  Personally reviewed MRI of the lumbar spine and cervical spine images and results with patient which were unremarkable without significant stenosis or foraminal narrowing at any level.  Risk factors for small fiber neuropathy include prediabetes.  Hemoglobin A1c 6.  B12 normal.  Today we will order lab work for other causes of small fiber neuropathy.  Recommend alpha lipoic acid 600 mg daily.  Also recommended that she see her primary care for treatment of her elevated glucose levels.  Orders Placed This Encounter  Procedures  . TSH  . HIV Antibody (routine testing w rflx)  . ANA w/Reflex  . Sjogren's syndrome antibods(ssa + ssb)  . Pan-ANCA  . RPR  . Hepatitis C antibody  . Rheumatoid factor  . Heavy metals, blood  . Vitamin B6  . Multiple Myeloma Panel (SPEP&IFE w/QIG)  . B. burgdorfi Antibody  . Vitamin B1  . Ambulatory referral to Orthopedic Surgery    A total of 25 minutes was spent face-to-face with this patient. Over half this time was spent on counseling patient on the  1. Bilateral carpal tunnel syndrome   2. Small fiber neuropathy   3. Paresthesias   4. Chronic right-sided low back pain with right-sided sciatica   5. Weakness   6. Weakness of both lower extremities   7. Hand numbness    diagnosis and different diagnostic and therapeutic options, counseling and coordination of care, risks ans  benefits of management, compliance, or risk factor reduction and education.  This does not include time spent on EMG nerve conduction study procedure.

## 2018-12-13 NOTE — Procedures (Signed)
Full Name: Natasha Hickman Gender: Female MRN #: [id number] Date of Birth: 1966/06/13    Visit Date: 12/13/2018 08:29 Age: 53 Years 7 Months Old Examining Physician: Naomie Dean, MD  Referring Physician: Coralee Rud, PA-C  History: Patient with sensory changes in the hands and feet. Summary: EMG/NCS was performed in the bilateral upper and right lower extremities.  The right Median 2nd Digit orthodromic sensory nerve showed prolonged distal peak latency (4.61ms, N<3.4). The left  Median 2nd Digit orthodromic sensory nerve showed prolonged distal peak latency (4.5 ms, N<3.4) and reduced amplitude(4 uV, N>10).  The right median/ulnar (palm) comparison nerve showed prolonged distal peak latency (Median Palm, 3.0 ms, N<2.2) and abnormal peak latency difference (Median Palm-Ulnar Palm, 1.0 ms, N<0.4) with a relative median delay.    The left median/ulnar (palm) comparison nerve showed prolonged distal peak latency (Median Palm, 3.2 ms, N<2.2) and abnormal peak latency difference (Median Palm-Ulnar Palm, 1.5 ms, N<0.4) with a relative median delay.    All remaining nerves (as indicated in the following tables) were within normal limits.All muscles (as indicated in the following tables) were within normal limits.       Conclusion:  1. There is electrophysiologic evidence of left > right median neuropathy at the wrist (carpal tunnel syndrome).  No suggestion of cervical radiculopathy.   2. Nerve conduction and EMG studies of the lower extremity were normal however this does not exclude a small fiber neuropathy.  Given patient's clinical symptoms, a small fiber neuropathy is possible.  No suggestion of lumbar radiculopathy.   Naomie Dean M.D.  [facility] [address] [place], [place] [address] Tel: [phone] Fax: [phone]   cc: Coralee Rud, PA-C     [facility]    Nerve / Sites Muscle Latency Ref. Amplitude Ref. Rel Amp Segments Distance Velocity Ref.  Area    ms ms mV mV %  cm m/s m/s mVms  R Median - APB     Wrist APB 4.1 ?4.4 11.5 ?4.0 100 Wrist - APB 7   39.3     Upper arm APB 7.9  12.0  104 Upper arm - Wrist 20 53 ?49 41.4  L Median - APB     Wrist APB 4.4 ?4.4 9.6 ?4.0 100 Wrist - APB 7   31.6     Upper arm APB 8.4  11.5  120 Upper arm - Wrist 20 50 ?49 35.0  R Ulnar - ADM     Wrist ADM 2.8 ?3.3 13.0 ?6.0 100 Wrist - ADM 7   43.1     B.Elbow ADM 5.7  12.9  99.2 B.Elbow - Wrist 18 62 ?49 43.1     A.Elbow ADM 7.3  11.7  90.4 A.Elbow - B.Elbow 10 60 ?49 39.1         A.Elbow - Wrist      L Ulnar - ADM     Wrist ADM 2.6 ?3.3 12.3 ?6.0 100 Wrist - ADM 7   37.9     B.Elbow ADM 5.2  10.9  88.7 B.Elbow - Wrist 17 64 ?49 34.1     A.Elbow ADM 6.8  10.5  96.6 A.Elbow - B.Elbow 10 62 ?49 33.8         A.Elbow - Wrist      R Peroneal - EDB     Ankle EDB 4.2 ?6.5 7.4 ?2.0 100 Ankle - EDB 9   21.8     Fib head EDB 9.3  7.2  97.3 Fib head - Ankle 26 51 ?44 21.7     Pop fossa EDB 11.3  7.0  97.6 Pop fossa - Fib head 10 49 ?44 19.1         Pop fossa - Ankle      R Tibial - AH     Ankle AH 4.2 ?5.8 6.9 ?4.0 100 Ankle - AH 9   17.5     Pop fossa AH 11.3  6.4  92.2 Pop fossa - Ankle 33 47 ?41 18.3                  SNC    Nerve / Sites Rec. Site Peak Lat Ref.  Amp Ref. Segments Distance Peak Diff Ref.    ms ms V V  cm ms ms  R Sural - Ankle (Calf)     Calf Ankle 3.9 ?4.4 21 ?6 Calf - Ankle 14    R Superficial peroneal - Ankle     Lat leg Ankle 3.6 ?4.4 7 ?6 Lat leg - Ankle 14    R Median, Ulnar - Transcarpal comparison     Median Palm Wrist 3.0 ?2.2 35 ?35 Median Palm - Wrist 8       Ulnar Palm Wrist 1.9 ?2.2 42 ?12 Ulnar Palm - Wrist 8          Median Palm - Ulnar Palm  1.0 ?0.4  L Median, Ulnar - Transcarpal comparison     Median Palm Wrist 3.2 ?2.2 22 ?35 Median Palm - Wrist 8       Ulnar Palm Wrist 1.7 ?2.2 79 ?12 Ulnar Palm - Wrist 8          Median Palm - Ulnar Palm  1.5 ?0.4  R Median - Orthodromic (Dig II, Mid palm)     Dig II  Wrist 4.1 ?3.4 12 ?10 Dig II - Wrist 13    L Median - Orthodromic (Dig II, Mid palm)     Dig II Wrist 4.5 ?3.4 4 ?10 Dig II - Wrist 13    R Ulnar - Orthodromic, (Dig V, Mid palm)     Dig V Wrist 2.8 ?3.1 20 ?5 Dig V - Wrist 11    L Ulnar - Orthodromic, (Dig V, Mid palm)     Dig V Wrist 2.8 ?3.1 15 ?5 Dig V - Wrist 55                       F  Wave    Nerve F Lat Ref.   ms ms  R Tibial - AH 44.6 ?56.0  R Ulnar - ADM 24.0 ?32.0  L Ulnar - ADM 23.9 ?32.0           EMG full       EMG Summary Table    Spontaneous MUAP Recruitment  Muscle IA Fib PSW Fasc Other Amp Dur. Poly Pattern  L. Deltoid Normal None None None _______ Normal Normal Normal Normal  L. Triceps brachii Normal None None None _______ Normal Normal Normal Normal  L. Pronator teres Normal None None None _______ Normal Normal Normal Normal  L. First dorsal interosseous Normal None None None _______ Normal Normal Normal Normal  L. Opponens pollicis Normal None None None _______ Normal Normal Normal Normal  L. Cervical paraspinals (low) Normal None None None _______ Normal Normal Normal Normal  L. Vastus medialis Normal None None None _______ Normal Normal Normal Normal  L. Tibialis anterior Normal  None None None _______ Normal Normal Normal Normal  L. Gastrocnemius (Medial head) Normal None None None _______ Normal Normal Normal Normal  L. Abductor hallucis Normal None None None _______ Normal Normal Normal Normal  L. Biceps femoris (long head) Normal None None None _______ Normal Normal Normal Normal  L. Gluteus medius Normal None None None _______ Normal Normal Normal Normal  L. Lumbar paraspinals (low) Normal None None None _______ Normal Normal Normal Normal

## 2018-12-13 NOTE — Progress Notes (Signed)
Full Name: Joleene Pesola Gender: Female MRN #: [id number] Date of Birth: 1966/06/13    Visit Date: 12/13/2018 08:29 Age: 53 Years 7 Months Old Examining Physician: Naomie Dean, MD  Referring Physician: Coralee Rud, PA-C  History: Patient with sensory changes in the hands and feet. Summary: EMG/NCS was performed in the bilateral upper and right lower extremities.  The right Median 2nd Digit orthodromic sensory nerve showed prolonged distal peak latency (4.61ms, N<3.4). The left  Median 2nd Digit orthodromic sensory nerve showed prolonged distal peak latency (4.5 ms, N<3.4) and reduced amplitude(4 uV, N>10).  The right median/ulnar (palm) comparison nerve showed prolonged distal peak latency (Median Palm, 3.0 ms, N<2.2) and abnormal peak latency difference (Median Palm-Ulnar Palm, 1.0 ms, N<0.4) with a relative median delay.    The left median/ulnar (palm) comparison nerve showed prolonged distal peak latency (Median Palm, 3.2 ms, N<2.2) and abnormal peak latency difference (Median Palm-Ulnar Palm, 1.5 ms, N<0.4) with a relative median delay.    All remaining nerves (as indicated in the following tables) were within normal limits.All muscles (as indicated in the following tables) were within normal limits.       Conclusion:  1. There is electrophysiologic evidence of left > right median neuropathy at the wrist (carpal tunnel syndrome).  No suggestion of cervical radiculopathy.   2. Nerve conduction and EMG studies of the lower extremity were normal however this does not exclude a small fiber neuropathy.  Given patient's clinical symptoms, a small fiber neuropathy is possible.  No suggestion of lumbar radiculopathy.   Naomie Dean M.D.  [facility] [address] [place], [place] [address] Tel: [phone] Fax: [phone]   cc: Coralee Rud, PA-C     [facility]    Nerve / Sites Muscle Latency Ref. Amplitude Ref. Rel Amp Segments Distance Velocity Ref.  Area    ms ms mV mV %  cm m/s m/s mVms  R Median - APB     Wrist APB 4.1 ?4.4 11.5 ?4.0 100 Wrist - APB 7   39.3     Upper arm APB 7.9  12.0  104 Upper arm - Wrist 20 53 ?49 41.4  L Median - APB     Wrist APB 4.4 ?4.4 9.6 ?4.0 100 Wrist - APB 7   31.6     Upper arm APB 8.4  11.5  120 Upper arm - Wrist 20 50 ?49 35.0  R Ulnar - ADM     Wrist ADM 2.8 ?3.3 13.0 ?6.0 100 Wrist - ADM 7   43.1     B.Elbow ADM 5.7  12.9  99.2 B.Elbow - Wrist 18 62 ?49 43.1     A.Elbow ADM 7.3  11.7  90.4 A.Elbow - B.Elbow 10 60 ?49 39.1         A.Elbow - Wrist      L Ulnar - ADM     Wrist ADM 2.6 ?3.3 12.3 ?6.0 100 Wrist - ADM 7   37.9     B.Elbow ADM 5.2  10.9  88.7 B.Elbow - Wrist 17 64 ?49 34.1     A.Elbow ADM 6.8  10.5  96.6 A.Elbow - B.Elbow 10 62 ?49 33.8         A.Elbow - Wrist      R Peroneal - EDB     Ankle EDB 4.2 ?6.5 7.4 ?2.0 100 Ankle - EDB 9   21.8     Fib head EDB 9.3  7.2  97.3 Fib head - Ankle 26 51 ?44 21.7     Pop fossa EDB 11.3  7.0  97.6 Pop fossa - Fib head 10 49 ?44 19.1         Pop fossa - Ankle      R Tibial - AH     Ankle AH 4.2 ?5.8 6.9 ?4.0 100 Ankle - AH 9   17.5     Pop fossa AH 11.3  6.4  92.2 Pop fossa - Ankle 33 47 ?41 18.3                  SNC    Nerve / Sites Rec. Site Peak Lat Ref.  Amp Ref. Segments Distance Peak Diff Ref.    ms ms V V  cm ms ms  R Sural - Ankle (Calf)     Calf Ankle 3.9 ?4.4 21 ?6 Calf - Ankle 14    R Superficial peroneal - Ankle     Lat leg Ankle 3.6 ?4.4 7 ?6 Lat leg - Ankle 14    R Median, Ulnar - Transcarpal comparison     Median Palm Wrist 3.0 ?2.2 35 ?35 Median Palm - Wrist 8       Ulnar Palm Wrist 1.9 ?2.2 42 ?12 Ulnar Palm - Wrist 8          Median Palm - Ulnar Palm  1.0 ?0.4  L Median, Ulnar - Transcarpal comparison     Median Palm Wrist 3.2 ?2.2 22 ?35 Median Palm - Wrist 8       Ulnar Palm Wrist 1.7 ?2.2 79 ?12 Ulnar Palm - Wrist 8          Median Palm - Ulnar Palm  1.5 ?0.4  R Median - Orthodromic (Dig II, Mid palm)     Dig II  Wrist 4.1 ?3.4 12 ?10 Dig II - Wrist 13    L Median - Orthodromic (Dig II, Mid palm)     Dig II Wrist 4.5 ?3.4 4 ?10 Dig II - Wrist 13    R Ulnar - Orthodromic, (Dig V, Mid palm)     Dig V Wrist 2.8 ?3.1 20 ?5 Dig V - Wrist 11    L Ulnar - Orthodromic, (Dig V, Mid palm)     Dig V Wrist 2.8 ?3.1 15 ?5 Dig V - Wrist 11                       F  Wave    Nerve F Lat Ref.   ms ms  R Tibial - AH 44.6 ?56.0  R Ulnar - ADM 24.0 ?32.0  L Ulnar - ADM 23.9 ?32.0           EMG full       EMG Summary Table    Spontaneous MUAP Recruitment  Muscle IA Fib PSW Fasc Other Amp Dur. Poly Pattern  L. Deltoid Normal None None None _______ Normal Normal Normal Normal  L. Triceps brachii Normal None None None _______ Normal Normal Normal Normal  L. Pronator teres Normal None None None _______ Normal Normal Normal Normal  L. First dorsal interosseous Normal None None None _______ Normal Normal Normal Normal  L. Opponens pollicis Normal None None None _______ Normal Normal Normal Normal  L. Cervical paraspinals (low) Normal None None None _______ Normal Normal Normal Normal  L. Vastus medialis Normal None None None _______ Normal Normal Normal Normal  L. Tibialis anterior Normal   None None None _______ Normal Normal Normal Normal  L. Gastrocnemius (Medial head) Normal None None None _______ Normal Normal Normal Normal  L. Abductor hallucis Normal None None None _______ Normal Normal Normal Normal  L. Biceps femoris (long head) Normal None None None _______ Normal Normal Normal Normal  L. Gluteus medius Normal None None None _______ Normal Normal Normal Normal  L. Lumbar paraspinals (low) Normal None None None _______ Normal Normal Normal Normal

## 2018-12-18 ENCOUNTER — Telehealth: Payer: Self-pay | Admitting: Neurology

## 2018-12-18 LAB — MULTIPLE MYELOMA PANEL, SERUM
ALBUMIN/GLOB SERPL: 1.4 (ref 0.7–1.7)
Albumin SerPl Elph-Mcnc: 4.2 g/dL (ref 2.9–4.4)
Alpha 1: 0.2 g/dL (ref 0.0–0.4)
Alpha2 Glob SerPl Elph-Mcnc: 0.9 g/dL (ref 0.4–1.0)
B-GLOBULIN SERPL ELPH-MCNC: 1.2 g/dL (ref 0.7–1.3)
GAMMA GLOB SERPL ELPH-MCNC: 1 g/dL (ref 0.4–1.8)
GLOBULIN, TOTAL: 3.2 g/dL (ref 2.2–3.9)
IgA/Immunoglobulin A, Serum: 301 mg/dL (ref 87–352)
IgG (Immunoglobin G), Serum: 1092 mg/dL (ref 700–1600)
IgM (Immunoglobulin M), Srm: 160 mg/dL (ref 26–217)
Total Protein: 7.4 g/dL (ref 6.0–8.5)

## 2018-12-18 LAB — RPR: RPR Ser Ql: NONREACTIVE

## 2018-12-18 LAB — VITAMIN B6: Vitamin B6: 18.2 ug/L (ref 2.0–32.8)

## 2018-12-18 LAB — PAN-ANCA
ANCA Proteinase 3: <3.5 U/mL (ref 0.0–3.5)
Atypical pANCA: 1:40 {titer} — ABNORMAL HIGH
C-ANCA: <1:20 {titer}
Myeloperoxidase Ab: <9 U/mL (ref 0.0–9.0)
P-ANCA: <1:20 {titer}

## 2018-12-18 LAB — VITAMIN B1: Thiamine: 93.1 nmol/L (ref 66.5–200.0)

## 2018-12-18 LAB — ANA W/REFLEX: Anti Nuclear Antibody(ANA): NEGATIVE

## 2018-12-18 LAB — HEAVY METALS, BLOOD
Arsenic: 17 ug/L (ref 2–23)
Lead, Blood: 2 ug/dL (ref 0–4)
Mercury: 3 ug/L (ref 0.0–14.9)

## 2018-12-18 LAB — HIV ANTIBODY (ROUTINE TESTING W REFLEX): HIV SCREEN 4TH GENERATION: NONREACTIVE

## 2018-12-18 LAB — TSH: TSH: 2.54 u[IU]/mL (ref 0.450–4.500)

## 2018-12-18 LAB — RHEUMATOID FACTOR: Rheumatoid fact SerPl-aCnc: 10.7 IU/mL (ref 0.0–13.9)

## 2018-12-18 LAB — SJOGREN'S SYNDROME ANTIBODS(SSA + SSB)
ENA SSA (RO) Ab: <0.2 AI (ref 0.0–0.9)
ENA SSB (LA) Ab: <0.2 AI (ref 0.0–0.9)

## 2018-12-18 LAB — HEPATITIS C ANTIBODY: Hep C Virus Ab: <0.1 s/co ratio (ref 0.0–0.9)

## 2018-12-18 LAB — B. BURGDORFI ANTIBODIES: Lyme IgG/IgM Ab: <0.91 {ISR} (ref 0.00–0.90)

## 2018-12-18 NOTE — Telephone Encounter (Signed)
-----   Message from Anson Fret, MD sent at 12/17/2018  3:06 PM EST ----- Labs so far all normal. We have one pending if that is abnormal we will call thanks

## 2018-12-18 NOTE — Telephone Encounter (Signed)
Called the patient to review the lab results. No answer. LVM informing the patient of the lab results being normal and that Dr Lucia Gaskins doesn't see anything concerning at this time. Advised that one lab was pending and if it was abnormal we will call again. Informed the patient to call with any questions or concerns.

## 2018-12-19 NOTE — Telephone Encounter (Signed)
Pt called for results and I informed her about the VM RN had left her concerning the normal results. Pt voiced understanding .

## 2018-12-24 ENCOUNTER — Telehealth: Payer: Self-pay | Admitting: Neurology

## 2018-12-24 NOTE — Telephone Encounter (Signed)
Returned pt's call @ [phone] and LVM with office hours.

## 2018-12-24 NOTE — Telephone Encounter (Signed)
Pt is asking for a call from RN to discuss a matter she has previously discussed with Dr Lucia Gaskins

## 2018-12-25 NOTE — Telephone Encounter (Signed)
I called the patient once more and LVM asking for call back.

## 2018-12-27 ENCOUNTER — Telehealth: Payer: Self-pay | Admitting: Neurology

## 2018-12-27 NOTE — Telephone Encounter (Signed)
@   8:47(when Epic was down) this morning pt called asking that Dr Lucia Gaskins call in something for her pain and was wanting to discuss results of her blood work.  Please call

## 2018-12-27 NOTE — Telephone Encounter (Signed)
I spoke with Dr. Lucia Gaskins. We will offer an appt with [name] NP for patient to come in and discuss her questions. If Gabapentin worked for her, we can reorder that in the meantime. I tried to call the patient back at [phone] but received no answer, unable to LVM. Will try again later.

## 2018-12-27 NOTE — Telephone Encounter (Signed)
I spoke with the patient. She wanted to know what additional lab was needing to be resulted. I advised that all labs have been done and had a result note that indicated labs were normal so far but there was one left. However I am unable to see any additional labs not resulted. I advised that I would d/w Dr. Lucia Gaskins. She verbalized appreciation. She stated she is taking Alpha L (?-could not recall the full name) and she is still in pain. She also saw Dr. Ethelene Hal and had injections but they did not help. She still had more questions about her diagnosis of the possible neuropathy. She wants to know how bad the neuropathy is, etc. I advised that I would d/w Dr. Lucia Gaskins and see what her recommendations were whether that includes a f/u appt, etc. She verbalized appreciation.

## 2018-12-28 NOTE — Telephone Encounter (Signed)
I called patient on mobile # [phone]. Unable to reach pt & her vm box was full.

## 2018-12-31 NOTE — Telephone Encounter (Signed)
I called the patient and LVM (Ok per [facility]) informing pt I had tried a couple times last week and couldn't reach her. I informed her that I spoke with Dr. Lucia Gaskins and has offered a f/u with [name] to discuss and in the meantime we could refill her Gabapentin if this worked for her in the past. I asked for a call back. Left office number in message.

## 2019-01-08 ENCOUNTER — Ambulatory Visit: Payer: BLUE CROSS/BLUE SHIELD | Admitting: Neurology

## 2019-05-01 ENCOUNTER — Telehealth: Payer: Self-pay | Admitting: Neurology

## 2019-05-01 NOTE — Telephone Encounter (Signed)
Pt called wanting RN to call her back to ask several questions about the NCV/EMG she had several months ago. Please advise.

## 2019-05-02 NOTE — Telephone Encounter (Signed)
I returned pt's call and LVM with office hours and number for a call back.

## 2019-07-28 ENCOUNTER — Encounter (HOSPITAL_BASED_OUTPATIENT_CLINIC_OR_DEPARTMENT_OTHER): Payer: Self-pay | Admitting: Emergency Medicine

## 2019-07-28 ENCOUNTER — Other Ambulatory Visit: Payer: Self-pay

## 2019-07-28 ENCOUNTER — Emergency Department (HOSPITAL_BASED_OUTPATIENT_CLINIC_OR_DEPARTMENT_OTHER)
Admission: EM | Admit: 2019-07-28 | Discharge: 2019-07-28 | Disposition: A | Discharge status: Home / Self Care | Payer: BC Managed Care – PPO | Attending: Emergency Medicine | Admitting: Emergency Medicine

## 2019-07-28 DIAGNOSIS — N183 Chronic kidney disease, stage 3 unspecified: Secondary | ICD-10-CM | POA: Diagnosis not present

## 2019-07-28 DIAGNOSIS — I129 Hypertensive chronic kidney disease with stage 1 through stage 4 chronic kidney disease, or unspecified chronic kidney disease: Secondary | ICD-10-CM | POA: Diagnosis not present

## 2019-07-28 DIAGNOSIS — R110 Nausea: Secondary | ICD-10-CM | POA: Diagnosis not present

## 2019-07-28 DIAGNOSIS — F17210 Nicotine dependence, cigarettes, uncomplicated: Secondary | ICD-10-CM | POA: Diagnosis not present

## 2019-07-28 DIAGNOSIS — Z79899 Other long term (current) drug therapy: Secondary | ICD-10-CM | POA: Diagnosis not present

## 2019-07-28 DIAGNOSIS — G43909 Migraine, unspecified, not intractable, without status migrainosus: Secondary | ICD-10-CM | POA: Insufficient documentation

## 2019-07-28 DIAGNOSIS — R519 Headache, unspecified: Secondary | ICD-10-CM

## 2019-07-28 MED ORDER — PROCHLORPERAZINE EDISYLATE 10 MG/2ML IJ SOLN
10.0000 mg | Freq: Once | INTRAMUSCULAR | Status: AC
  Administered 2019-07-28: 09:00:00 10 mg via INTRAVENOUS
  Filled 2019-07-28: qty 2

## 2019-07-28 MED ORDER — SODIUM CHLORIDE 0.9 % IV BOLUS
1000.0000 mL | Freq: Once | INTRAVENOUS | Status: AC
  Administered 2019-07-28: 09:00:00 1000 mL via INTRAVENOUS

## 2019-07-28 MED ORDER — DIPHENHYDRAMINE HCL 50 MG/ML IJ SOLN
25.0000 mg | Freq: Once | INTRAMUSCULAR | Status: AC
  Administered 2019-07-28: 25 mg via INTRAVENOUS
  Filled 2019-07-28: qty 1

## 2019-07-28 NOTE — ED Triage Notes (Signed)
Pt here with sinus infection x 7 days. Here primarily for a very persistent headache. Pt is tearful and would like treatment for headache.

## 2019-07-28 NOTE — ED Provider Notes (Signed)
[facility] EMERGENCY DEPARTMENT Provider Note   CSN: [id number] Arrival date & time: 07/28/19  8850     History   Chief Complaint Chief Complaint  Patient presents with  . Headache    HPI Natasha Hickman is a 53 y.o. female.     HPI   One week of headache, frontal headache radiating to the top and to back and neck. Ice pack helps for a few minutes then it comes back.   Bright lights can make it worse. Doctor started her on abx for sinus infection on Wednesday, he changed it to azithromycin, was having side effects to other.  Taking tylenol and ibuprofen with continuing pain.  Pressure pain.  No numbness/weakness/visual changes/problems walking/talking/facial droop.  Associated nausea, no vomiting.  No abdominal pain, cough, fevers, trauma.  Used to have migraines but has been a long time.   Past Medical History:  Diagnosis Date  . Acid reflux   . Anxiety   . Barrett's esophagus   . Chronic kidney disease (CKD), stage III (moderate)    11/21/18 pt denies  . DDD (degenerative disc disease), cervical   . Fatigue   . GERD (gastroesophageal reflux disease)   . Hiatal hernia   . High risk medication use   . Hypertension   . IBS (irritable bowel syndrome)    No longer have  . Migraines   . Mixed hyperlipidemia     Patient Active Problem List   Diagnosis Date Noted  . Chronic right-sided low back pain with right-sided sciatica 11/21/2018  . Hand numbness 11/21/2018  . Lumbar radiculopathy 11/21/2018  . Ingrown nail 08/08/2014  . Pain in lower limb 08/08/2014  . Abdominal pain 06/28/2013  . HEADACHE 01/26/2009  . GERD 01/09/2009  . ALLERGIC RHINITIS 01/06/2009  . DIARRHEA 01/06/2009  . UNSPECIFIED DISORDER OF ESOPHAGUS 12/15/2008  . ANXIETY STATE, UNSPECIFIED 11/18/2008  . TOBACCO ABUSE 11/18/2008  . HYPERTENSION 11/18/2008  . DYSPNEA 11/18/2008    Past Surgical History:  Procedure Laterality Date  . ABDOMINAL HYSTERECTOMY    . ESOPHAGOGASTRIC  FUNDOPLASTY    . GASTRECTOMY    . SHOULDER SURGERY Right    arthroscopic      OB History   No obstetric history on file.      Home Medications    Prior to Admission medications   Medication Sig Start Date End Date Taking? Authorizing Provider  ALPRAZolam (XANAX) 0.25 MG tablet Take 0.125 mg by mouth once as needed. For anxiety    [provider]  amLODipine-olmesartan (AZOR) 5-20 MG per tablet Take 1 tablet by mouth daily.      [provider]  BLACK CURRANT SEED OIL PO Take by mouth.    [provider]  clonazePAM (KLONOPIN) 0.5 MG tablet Take 0.5 mg by mouth 2 (two) times daily as needed.    [provider]  cyclobenzaprine (FLEXERIL) 10 MG tablet Take 10 mg by mouth 3 (three) times daily as needed for muscle spasms.    [provider]  dicyclomine (BENTYL) 20 MG tablet Take 1 tablet (20 mg total) by mouth 2 (two) times daily. 09/23/12   Elson Areas, PA-C  DOCOSAHEXAENOIC ACID PO Take 1 g by mouth.    [provider]  esomeprazole (NEXIUM) 40 MG capsule Take 40 mg by mouth daily before breakfast.    [provider]  ferrous sulfate 325 (65 FE) MG tablet Take 325 mg by mouth 2 (two) times daily with a meal.  [provider]  fish oil-omega-3 fatty acids 1000 MG capsule Take 1 g by mouth 3 (three) times daily.    [provider]  Flaxseed, Linseed, (FLAXSEED OIL) 1000 MG CAPS Take 1 capsule by mouth 2 (two) times daily.    [provider]  fluticasone (FLONASE) 50 MCG/ACT nasal spray 1-2 SPRAY, NASAL, IN EACH NOSTRIL DAILY 04/17/15   [provider]  gabapentin (NEURONTIN) 100 MG capsule Take 100 mg by mouth 3 (three) times daily.    [provider]  Garlic 1000 MG CAPS Take 1 capsule by mouth daily.    [provider]  meloxicam (MOBIC) 15 MG tablet Take 1 tablet (15 mg total) by mouth daily. TAKE WITH MEALS Patient not taking: Reported on 11/21/2018 08/13/16   Street,  LehightonMercedes, PA-C  methocarbamol (ROBAXIN) 500 MG tablet Take 1 tablet (500 mg total) by mouth 3 (three) times daily between meals as needed. Patient not taking: Reported on 11/21/2018 12/23/16   Rolland PorterJames, Mark, MD  Multiple Vitamins-Minerals (MULTIVITAMIN WITH MINERALS) tablet Take 1 tablet by mouth daily.      [provider]  naproxen (NAPROSYN) 500 MG tablet Take 1 tablet (500 mg total) by mouth 2 (two) times daily. Patient not taking: Reported on 11/21/2018 12/23/16   Rolland PorterJames, Mark, MD  neomycin-polymyxin-gramicidin (NEOSPORIN) 1.75-10000-.025 ophthalmic solution Place 1-2 drops into the right eye 4 (four) times daily.    [provider]  RABEprazole (ACIPHEX) 20 MG tablet Take by mouth. 09/26/16   [provider]  Vitamin D, Ergocalciferol, (DRISDOL) 50000 units CAPS capsule Take 50,000 Units by mouth every 7 (seven) days.    [provider]  famotidine (PEPCID) 20 MG tablet Take 1 tablet (20 mg total) by mouth 2 (two) times daily as needed for heartburn (abdominal pain). 12/08/11 12/21/11  Felisa Bonieronnor, Michael D, MD  omeprazole (PRILOSEC) 40 MG capsule Take 40 mg by mouth daily.    12/21/11  [provider]    Family History Family History  Problem Relation Age of Onset  . CAD Mother   . Hypertension Mother   . Diabetes Mother   . CAD Father   . Hypertension Father   . Hypertension Sister   . Diabetes Brother     Social History Social History   Tobacco Use  . Smoking status: Current Every Day Smoker    Packs/day: 0.75    Types: Cigarettes  . Smokeless tobacco: Never Used  Substance Use Topics  . Alcohol use: Yes    Alcohol/week: 0.0 standard drinks    Comment: sometimes  . Drug use: No     Allergies   Adhesive [tape], Meperidine hcl, Percocet [oxycodone-acetaminophen], and Septra [bactrim]   Review of Systems Review of Systems  Constitutional: Negative for fever.  HENT: Positive for sinus pain. Negative for congestion and sore throat.   Eyes:  Negative for visual disturbance.  Respiratory: Negative for cough and shortness of breath.   Cardiovascular: Negative for chest pain.  Gastrointestinal: Positive for nausea. Negative for abdominal pain and diarrhea.  Genitourinary: Negative for difficulty urinating.  Musculoskeletal: Positive for neck pain. Negative for back pain.  Skin: Negative for rash.  Neurological: Positive for headaches. Negative for syncope, facial asymmetry, weakness and numbness.     Physical Exam Updated Vital Signs BP (!) 143/107 (BP Location: Left Arm)   Pulse 95   Temp 97.7 F (36.5 C) (Oral)   Resp 16   Ht 5\' 2"  (1.575 m)   Wt 58.5 kg  SpO2 100%   BMI 23.59 kg/m   Physical Exam Vitals signs and nursing note reviewed.  Constitutional:      General: She is not in acute distress.    Appearance: She is well-developed. She is not diaphoretic.  HENT:     Head: Normocephalic and atraumatic.  Eyes:     General: No visual field deficit.    Conjunctiva/sclera: Conjunctivae normal.  Neck:     Musculoskeletal: Normal range of motion.  Cardiovascular:     Rate and Rhythm: Normal rate and regular rhythm.     Heart sounds: Normal heart sounds. No murmur. No friction rub. No gallop.   Pulmonary:     Effort: Pulmonary effort is normal. No respiratory distress.     Breath sounds: Normal breath sounds. No wheezing or rales.  Abdominal:     General: There is no distension.     Palpations: Abdomen is soft.     Tenderness: There is no abdominal tenderness. There is no guarding.  Musculoskeletal:        General: No tenderness.  Skin:    General: Skin is warm and dry.     Findings: No erythema or rash.  Neurological:     Mental Status: She is alert and oriented to person, place, and time.     GCS: GCS eye subscore is 4. GCS verbal subscore is 5. GCS motor subscore is 6.     Cranial Nerves: Cranial nerves are intact. No cranial nerve deficit, dysarthria or facial asymmetry.     Sensory: Sensation is  intact. No sensory deficit.     Motor: Motor function is intact. No weakness, tremor or pronator drift.     Coordination: Coordination is intact. Coordination normal. Finger-Nose-Finger Test normal.      ED Treatments / Results  Labs (all labs ordered are listed, but only abnormal results are displayed) Labs Reviewed - No data to display  EKG None  Radiology No results found.  Procedures Procedures (including critical care time)  Medications Ordered in ED Medications  sodium chloride 0.9 % bolus 1,000 mL (0 mLs Intravenous Stopped 07/28/19 1046)  prochlorperazine (COMPAZINE) injection 10 mg (10 mg Intravenous Given 07/28/19 0924)  diphenhydrAMINE (BENADRYL) injection 25 mg (25 mg Intravenous Given 07/28/19 0924)     Initial Impression / Assessment and Plan / ED Course  I have reviewed the triage vital signs and the nursing notes.  Pertinent labs & imaging results that were available during my care of the patient were reviewed by me and considered in my medical decision making (see chart for details).        53 year old female with a history of hypertension, hyperlipidemia, past history of migraines, presents with concern for headache that has lasted 1 week, and for which she had antibiotics initiated for possible sinus infection with her PCP.  Headache began slowly, no trauma, no fevers, and normal neurologic exam and have low suspicion for [facility], SDH or meningitis.  Given photophobia, nausea, history of migraines, overall suspect headache is most likely a migraine headache.  She has normal extraocular movements, is afebrile, low suspicion for sinus thrombosis.  Given some facial tenderness, agree that it is all that she has a sinus infection, although feel this may have triggered worsening of headache and migraine.  She was given fluids,compazine and benadryl with improvement in headache. Recommend PCP follow up.  Patient discharged in stable condition with understanding of  reasons to return.    Final Clinical Impressions(s) / ED Diagnoses  Final diagnoses:  Migraine without status migrainosus, not intractable, unspecified migraine type  Acute nonintractable headache, unspecified headache type    ED Discharge Orders    None       Gareth Morgan, MD 07/28/19 2302

## 2019-11-19 NOTE — Progress Notes (Deleted)
Patient presented to me earlier this month with multiple complaints including chronic low back pain, chronic neck pain, paresthesias and numbness in the hands and feet.  Reviewed work-up to date with patient.  EMG nerve conduction study consistent with left >than right mild to moderate carpal tunnel syndrome. Will refer to orthopaedics for treatment. Discussed conservative treatment for now.  Lower extremity normal however this does not exclude small fiber neuropathy which is consistent with Natasha Hickman symptoms.  There was no suggestion of cervical radiculopathy or lumbar radiculopathy.  Personally reviewed MRI of the lumbar spine and cervical spine images and results with patient which were unremarkable without significant stenosis or foraminal narrowing at any level.  Risk factors for small fiber neuropathy include prediabetes.  Hemoglobin A1c 6.  B12 normal.  Today we will order lab work for other causes of small fiber neuropathy.  Recommend alpha lipoic acid 600 mg daily.  Also recommended that she see Natasha Hickman primary care for treatment of Natasha Hickman elevated glucose levels.  No orders of the defined types were placed in this encounter.   A total of 25 minutes was spent face-to-face with this patient. Over half this time was spent on counseling patient on the  No diagnosis found. diagnosis and different diagnostic and therapeutic options, counseling and coordination of care, risks ans benefits of management, compliance, or risk factor reduction and education.  This does not include time spent on EMG nerve conduction study procedure.    [facility]    Provider:  Dr Lucia Gaskins Referring Provider: Coralee Rud, PA-C Primary Care Provider:  Coralee Rud, PA-C  CC:  tingling in fingers, toes, numb; pain in right knee down to feet, numbness  HPI:  Natasha Hickman is a 54 y.o. female here as requested by provider Coralee Rud, PA-C for degenerative disk disease. She has chronic hx of  low back pain and other joint related pain issues and has seen neurologists in the past and has had extensive imaging(see Care Everywhere) of back, upper extremities and shoulders (MRIs), XRs of shoulders, MRIs of lower extremities,xr of femur and spine.    She has chronic tingling in the fingers and the feet. They feel numb. She has significant neck pain. She has lower back pain. She has had "bulging disks for a long time".  The numbness is in the fingers, worse in the morning, wakes Natasha Hickman up with numbness. Weakness sin the hands. She denies radicular symptoms.  She also has low back pain, radiates into the calf and all the way down to the feet. te right is worse, in the left foot the feet are numb. She has pain in the low back, worse with sitting long periods. She was seeing a prior neurologist in [facility]. She never had an emg/ncs or maybe more than 10 years ago. She types a lot. She sits all day. Worse when laying bed. She had injection sin the lumbar spine, she has been under the care of a physician for years, tried injections, muscle relaxers, OTC NSAIDs, heat and stretching. Injections helped for a little bit.   Reviewed notes, labs and imaging from outside physicians, which showed:  Reviewed reports from Epic MRI Lumbar Spine 2015 report from [facility]:   Abnormal MRI of the lumbar spine performed without  contrast. There is a small broad-based disc protrusion at L5-S1 with probable annular tear and a small annular tear at L3-L4 in the midline. There is no nerve root impingement   04/2015: AP  and lateral x-ray of the left femur show no fracture dislocation subluxation or tumor. Tension was made to the cortices of the midshaft of the femur and distal femur. No periosteal elevation or lytic areas  MRI 05/2015: IMPRESSION: No acute abnormality or finding to explain the patient's symptoms. The left thigh appears normal.  Review of Systems: Patient complains of symptoms per HPI as well as the  following symptoms: numbness, tingling, weakness. Pertinent negatives and positives per HPI. All others negative.   Social History   Socioeconomic History  . Marital status: Married    Spouse name: Not on file  . Number of children: 2  . Years of education: 47  . Highest education level: Not on file  Occupational History  . Not on file  Tobacco Use  . Smoking status: Current Every Day Smoker    Packs/day: 0.75    Types: Cigarettes  . Smokeless tobacco: Never Used  Substance and Sexual Activity  . Alcohol use: Yes    Alcohol/week: 0.0 standard drinks    Comment: sometimes  . Drug use: No  . Sexual activity: Not on file  Other Topics Concern  . Not on file  Social History Narrative   lives with husband   Caffeine- some tea   Social Determinants of Health   Financial Resource Strain:   . Difficulty of Paying Living Expenses: Not on file  Food Insecurity:   . Worried About Programme researcher, broadcasting/film/video in the Last Year: Not on file  . Ran Out of Food in the Last Year: Not on file  Transportation Needs:   . Lack of Transportation (Medical): Not on file  . Lack of Transportation (Non-Medical): Not on file  Physical Activity:   . Days of Exercise per Week: Not on file  . Minutes of Exercise per Session: Not on file  Stress:   . Feeling of Stress : Not on file  Social Connections:   . Frequency of Communication with Friends and Family: Not on file  . Frequency of Social Gatherings with Friends and Family: Not on file  . Attends Religious Services: Not on file  . Active Member of Clubs or Organizations: Not on file  . Attends Banker Meetings: Not on file  . Marital Status: Not on file  Intimate Partner Violence:   . Fear of Current or Ex-Partner: Not on file  . Emotionally Abused: Not on file  . Physically Abused: Not on file  . Sexually Abused: Not on file    Family History  Problem Relation Age of Onset  . CAD Mother   . Hypertension Mother   . Diabetes  Mother   . CAD Father   . Hypertension Father   . Hypertension Sister   . Diabetes Brother     Past Medical History:  Diagnosis Date  . Acid reflux   . Anxiety   . Barrett's esophagus   . Chronic kidney disease (CKD), stage III (moderate)    11/21/18 pt denies  . DDD (degenerative disc disease), cervical   . Fatigue   . GERD (gastroesophageal reflux disease)   . Hiatal hernia   . High risk medication use   . Hypertension   . IBS (irritable bowel syndrome)    No longer have  . Migraines   . Mixed hyperlipidemia     Patient Active Problem List   Diagnosis Date Noted  . Chronic right-sided low back pain with right-sided sciatica 11/21/2018  . Hand numbness 11/21/2018  .  Lumbar radiculopathy 11/21/2018  . Ingrown nail 08/08/2014  . Pain in lower limb 08/08/2014  . Abdominal pain 06/28/2013  . HEADACHE 01/26/2009  . GERD 01/09/2009  . ALLERGIC RHINITIS 01/06/2009  . DIARRHEA 01/06/2009  . UNSPECIFIED DISORDER OF ESOPHAGUS 12/15/2008  . ANXIETY STATE, UNSPECIFIED 11/18/2008  . TOBACCO ABUSE 11/18/2008  . HYPERTENSION 11/18/2008  . DYSPNEA 11/18/2008    Past Surgical History:  Procedure Laterality Date  . ABDOMINAL HYSTERECTOMY    . ESOPHAGOGASTRIC FUNDOPLASTY    . GASTRECTOMY    . SHOULDER SURGERY Right    arthroscopic     Current Outpatient Medications  Medication Sig Dispense Refill  . ALPRAZolam (XANAX) 0.25 MG tablet Take 0.125 mg by mouth once as needed. For anxiety    . amLODipine-olmesartan (AZOR) 5-20 MG per tablet Take 1 tablet by mouth daily.      Marland Kitchen BLACK CURRANT SEED OIL PO Take by mouth.    . clonazePAM (KLONOPIN) 0.5 MG tablet Take 0.5 mg by mouth 2 (two) times daily as needed.    . cyclobenzaprine (FLEXERIL) 10 MG tablet Take 10 mg by mouth 3 (three) times daily as needed for muscle spasms.    Marland Kitchen dicyclomine (BENTYL) 20 MG tablet Take 1 tablet (20 mg total) by mouth 2 (two) times daily. 20 tablet 0  . DOCOSAHEXAENOIC ACID PO Take 1 g by mouth.    .  esomeprazole (NEXIUM) 40 MG capsule Take 40 mg by mouth daily before breakfast.    . ferrous sulfate 325 (65 FE) MG tablet Take 325 mg by mouth 2 (two) times daily with a meal.    . fish oil-omega-3 fatty acids 1000 MG capsule Take 1 g by mouth 3 (three) times daily.    . Flaxseed, Linseed, (FLAXSEED OIL) 1000 MG CAPS Take 1 capsule by mouth 2 (two) times daily.    . fluticasone (FLONASE) 50 MCG/ACT nasal spray 1-2 SPRAY, NASAL, IN EACH NOSTRIL DAILY    . gabapentin (NEURONTIN) 100 MG capsule Take 100 mg by mouth 3 (three) times daily.    . Garlic 0160 MG CAPS Take 1 capsule by mouth daily.    . meloxicam (MOBIC) 15 MG tablet Take 1 tablet (15 mg total) by mouth daily. TAKE WITH MEALS (Patient not taking: Reported on 11/21/2018) 30 tablet 0  . methocarbamol (ROBAXIN) 500 MG tablet Take 1 tablet (500 mg total) by mouth 3 (three) times daily between meals as needed. (Patient not taking: Reported on 11/21/2018) 20 tablet 0  . Multiple Vitamins-Minerals (MULTIVITAMIN WITH MINERALS) tablet Take 1 tablet by mouth daily.      . naproxen (NAPROSYN) 500 MG tablet Take 1 tablet (500 mg total) by mouth 2 (two) times daily. (Patient not taking: Reported on 11/21/2018) 30 tablet 0  . neomycin-polymyxin-gramicidin (NEOSPORIN) 1.75-10000-.025 ophthalmic solution Place 1-2 drops into the right eye 4 (four) times daily.    . RABEprazole (ACIPHEX) 20 MG tablet Take by mouth.    . Vitamin D, Ergocalciferol, (DRISDOL) 50000 units CAPS capsule Take 50,000 Units by mouth every 7 (seven) days.     No current facility-administered medications for this visit.    Allergies as of 11/20/2019 - Review Complete 07/28/2019  Allergen Reaction Noted  . Adhesive [tape] Other (See Comments) 12/07/2011  . Meperidine hcl Swelling   . Percocet [oxycodone-acetaminophen] Swelling 06/17/2011  . Septra [bactrim] Rash 06/17/2011    Vitals: There were no vitals taken for this visit. Last Weight:  Wt Readings from Last 1 Encounters:  07/28/19 129 lb (58.5 kg)   Last Height:   Ht Readings from Last 1 Encounters:  07/28/19 5\' 2"  (1.575 m)     Physical exam: Exam: Gen: NAD, conversant, well nourised, well groomed                     CV: RRR, no MRG. No Carotid Bruits. No peripheral edema, warm, nontender Eyes: Conjunctivae clear without exudates or hemorrhage  Neuro: Detailed Neurologic Exam  Speech:    Speech is normal; fluent and spontaneous with normal comprehension.  Cognition:    The patient is oriented to person, place, and time;     recent and remote memory intact;     language fluent;     normal attention, concentration,     fund of knowledge Cranial Nerves:    The pupils are equal, round, and reactive to light. The fundi are normal and spontaneous venous pulsations are present. Visual fields are full to finger confrontation. Extraocular movements are intact. Trigeminal sensation is intact and the muscles of mastication are normal. The face is symmetric. The palate elevates in the midline. Hearing intact. Voice is normal. Shoulder shrug is normal. The tongue has normal motion without fasciculations.   Coordination:    Normal finger to nose and heel to shin. Normal rapid alternating movements.   Gait:    Heel-toe and tandem gait are normal.   Motor Observation:    No asymmetry, no atrophy, and no involuntary movements noted. Tone:    Normal muscle tone.    Posture:    Posture is normal. normal erect    Strength: 5-/5 leg flexion otherwise strength is V/V in the upper and lower limbs.      Sensation: intact to LT, pin prick distally in the feet.      Reflex Exam:  DTR's:    Deep tendon reflexes in the upper and lower extremities are brisk bilaterally with  Toes:    The toes are downgoing bilaterally.   Clonus:    + clonus left 2 beats AJ1    +Phalen's maneuver  Assessment/Plan:  54 y.o. female here as requested by provider 45, PA-C for degenerative disk disease. She has  chronic hx of low back pain and other joint related pain issues and has seen neurologists in the past and has had extensive hx of imaging(see Care Everywhere) of back, upper extremities and shoulders (MRIs), XRs of shoulders, MRIs of lower extremities,xr of femur and spine.    - Upper extremity symptoms clinically sound like CTS: EMG/NCS bilateral upper extremities (will also do the lower) - Lumbar radic vs distal polyneuropathy or both: EMG/NCS of the bilateral lower extremities(start witht he right leg more symptomatic and if abnormal can proceed to the left leg). MRI of the lumbar spine, she has not had that since 2015 and has tried and failed conservative therapies including lumbar injections and continues to have  - Physical therapy: chronic low back pain, spasms, tightness. Please include dry needling.[place] - Dr. AURA for interventional procedures of the lower spine - MRI of the cervical spine due to upper motor neuron signs, chronic neck pain, clonus, paresthesias and leg weakness. need to evaluate for cervical myelopathy.     No orders of the defined types were placed in this encounter.  No orders of the defined types were placed in this encounter.   Cc: Ethelene Hal, PA-C,    Coralee Rud, MD  [facility]  [address] [place], [place] [address]  Phone [phone] Fax [phone]

## 2019-11-20 ENCOUNTER — Ambulatory Visit: Payer: BLUE CROSS/BLUE SHIELD | Admitting: Neurology

## 2019-11-22 ENCOUNTER — Encounter: Payer: Self-pay | Admitting: Neurology

## 2020-02-12 ENCOUNTER — Emergency Department (HOSPITAL_BASED_OUTPATIENT_CLINIC_OR_DEPARTMENT_OTHER)
Admission: EM | Admit: 2020-02-12 | Discharge: 2020-02-12 | Disposition: A | Discharge status: Home / Self Care | Payer: BC Managed Care – PPO | Attending: Emergency Medicine | Admitting: Emergency Medicine

## 2020-02-12 ENCOUNTER — Other Ambulatory Visit: Payer: Self-pay

## 2020-02-12 ENCOUNTER — Emergency Department (HOSPITAL_BASED_OUTPATIENT_CLINIC_OR_DEPARTMENT_OTHER): Payer: BC Managed Care – PPO

## 2020-02-12 ENCOUNTER — Encounter (HOSPITAL_BASED_OUTPATIENT_CLINIC_OR_DEPARTMENT_OTHER): Payer: Self-pay | Admitting: *Deleted

## 2020-02-12 DIAGNOSIS — M5412 Radiculopathy, cervical region: Secondary | ICD-10-CM | POA: Diagnosis not present

## 2020-02-12 DIAGNOSIS — Z91048 Other nonmedicinal substance allergy status: Secondary | ICD-10-CM | POA: Insufficient documentation

## 2020-02-12 DIAGNOSIS — Z885 Allergy status to narcotic agent status: Secondary | ICD-10-CM | POA: Diagnosis not present

## 2020-02-12 DIAGNOSIS — Z882 Allergy status to sulfonamides status: Secondary | ICD-10-CM | POA: Diagnosis not present

## 2020-02-12 DIAGNOSIS — I10 Essential (primary) hypertension: Secondary | ICD-10-CM | POA: Insufficient documentation

## 2020-02-12 DIAGNOSIS — E782 Mixed hyperlipidemia: Secondary | ICD-10-CM | POA: Diagnosis not present

## 2020-02-12 DIAGNOSIS — Z888 Allergy status to other drugs, medicaments and biological substances status: Secondary | ICD-10-CM | POA: Diagnosis not present

## 2020-02-12 DIAGNOSIS — R0789 Other chest pain: Secondary | ICD-10-CM | POA: Diagnosis present

## 2020-02-12 DIAGNOSIS — Z79899 Other long term (current) drug therapy: Secondary | ICD-10-CM | POA: Diagnosis not present

## 2020-02-12 DIAGNOSIS — F17210 Nicotine dependence, cigarettes, uncomplicated: Secondary | ICD-10-CM | POA: Diagnosis not present

## 2020-02-12 DIAGNOSIS — R202 Paresthesia of skin: Secondary | ICD-10-CM

## 2020-02-12 HISTORY — DX: Disorder of kidney and ureter, unspecified: N28.9

## 2020-02-12 LAB — CBC
HCT: 40.8 % (ref 36.0–46.0)
Hemoglobin: 13.5 g/dL (ref 12.0–15.0)
MCH: 30.2 pg (ref 26.0–34.0)
MCHC: 33.1 g/dL (ref 30.0–36.0)
MCV: 91.3 fL (ref 80.0–100.0)
Platelets: 196 10*3/uL (ref 150–400)
RBC: 4.47 MIL/uL (ref 3.87–5.11)
RDW: 12.4 % (ref 11.5–15.5)
WBC: 4.4 10*3/uL (ref 4.0–10.5)
nRBC: 0 % (ref 0.0–0.2)

## 2020-02-12 LAB — CBG MONITORING, ED: Glucose-Capillary: 87 mg/dL (ref 70–99)

## 2020-02-12 LAB — BASIC METABOLIC PANEL
Anion gap: 10 (ref 5–15)
BUN: 15 mg/dL (ref 6–20)
CO2: 25 mmol/L (ref 22–32)
Calcium: 9.9 mg/dL (ref 8.9–10.3)
Chloride: 104 mmol/L (ref 98–111)
Creatinine, Ser: 0.66 mg/dL (ref 0.44–1.00)
GFR calc Af Amer: >60 mL/min (ref >60)
GFR calc non Af Amer: >60 mL/min (ref >60)
Glucose, Bld: 92 mg/dL (ref 70–99)
Potassium: 3.9 mmol/L (ref 3.5–5.1)
Sodium: 139 mmol/L (ref 135–145)

## 2020-02-12 LAB — PREGNANCY, URINE: Preg Test, Ur: NEGATIVE

## 2020-02-12 LAB — TROPONIN I (HIGH SENSITIVITY): Troponin I (High Sensitivity): 2 ng/L (ref <18)

## 2020-02-12 MED ORDER — FENTANYL CITRATE (PF) 100 MCG/2ML IJ SOLN
50.0000 ug | Freq: Once | INTRAMUSCULAR | Status: AC
  Administered 2020-02-12: 50 ug via INTRAVENOUS
  Filled 2020-02-12: qty 2

## 2020-02-12 MED ORDER — METHOCARBAMOL 500 MG PO TABS
500.0000 mg | ORAL_TABLET | Freq: Three times a day (TID) | ORAL | 0 refills | Status: DC | PRN
Start: 2020-02-12 — End: 2020-11-06

## 2020-02-12 MED ORDER — NAPROXEN 500 MG PO TABS
500.0000 mg | ORAL_TABLET | Freq: Two times a day (BID) | ORAL | 0 refills | Status: DC
Start: 2020-02-12 — End: 2020-11-06

## 2020-02-12 MED ORDER — ASPIRIN 81 MG PO CHEW
324.0000 mg | CHEWABLE_TABLET | Freq: Once | ORAL | Status: AC
  Administered 2020-02-12: 324 mg via ORAL
  Filled 2020-02-12: qty 4

## 2020-02-12 MED FILL — METHOCARBAMOL 500 MG TABLET: 500 | 7 days supply | Qty: 20 | Fill #0

## 2020-02-12 MED FILL — NAPROXEN 500 MG TABS: 500 | 15 days supply | Qty: 30 | Fill #0

## 2020-02-12 NOTE — ED Triage Notes (Signed)
Pt states she can't remember whether or not she took her BP meds today

## 2020-02-12 NOTE — ED Provider Notes (Signed)
[facility] EMERGENCY DEPARTMENT Provider Note   CSN: [id number] Arrival date & time: 02/12/20  1311     History Chief Complaint  Patient presents with  . Arm Pain    Natasha Hickman is a 54 y.o. female.  HPI  HPI: A 54 year old patient with a history of hypertension and hypercholesterolemia presents for evaluation of chest pain. Initial onset of pain was more than 6 hours ago. The patient's chest pain is sharp and is not worse with exertion. The patient's chest pain is not middle- or left-sided, is not well-localized, is not described as heaviness/pressure/tightness and does radiate to the arms/jaw/neck. The patient does not complain of nausea and denies diaphoresis. The patient has smoked in the past 90 days. The patient has no history of stroke, has no history of peripheral artery disease, denies any history of treated diabetes, has no relevant family history of coronary artery disease (first degree relative at less than age 13) and does not have an elevated BMI (>=30). Patient presents the ED for evaluation of neck pain.  Patient states she has been having trouble with this for several months.  She has had pain in the left neck that moves down her left arm.  She has some numbness and tingling in her left arm as well.  Patient states she has had some intermittent chest discomfort with this in the past.  She went to see her doctor previously for this.  Patient states she is referred for to have a stress test.  Patient continues have pain so she came to the ED today.  Its more intense and sharp.  She is not having chest pain or shortness of breath.  Patient does smoke.  Past Medical History:  Diagnosis Date  . Acid reflux   . Anxiety   . Barrett's esophagus   . DDD (degenerative disc disease), cervical   . Fatigue   . GERD (gastroesophageal reflux disease)   . Hiatal hernia   . High risk medication use   . Hypertension   . IBS (irritable bowel syndrome)    No longer have  .  Migraines   . Mixed hyperlipidemia   . Renal disorder     Patient Active Problem List   Diagnosis Date Noted  . Chronic right-sided low back pain with right-sided sciatica 11/21/2018  . Hand numbness 11/21/2018  . Lumbar radiculopathy 11/21/2018  . Ingrown nail 08/08/2014  . Pain in lower limb 08/08/2014  . Abdominal pain 06/28/2013  . HEADACHE 01/26/2009  . GERD 01/09/2009  . ALLERGIC RHINITIS 01/06/2009  . DIARRHEA 01/06/2009  . UNSPECIFIED DISORDER OF ESOPHAGUS 12/15/2008  . ANXIETY STATE, UNSPECIFIED 11/18/2008  . TOBACCO ABUSE 11/18/2008  . HYPERTENSION 11/18/2008  . DYSPNEA 11/18/2008    Past Surgical History:  Procedure Laterality Date  . ABDOMINAL HYSTERECTOMY    . ESOPHAGOGASTRIC FUNDOPLASTY    . GASTRECTOMY    . SHOULDER SURGERY Right    arthroscopic      OB History   No obstetric history on file.     Family History  Problem Relation Age of Onset  . CAD Mother   . Hypertension Mother   . Diabetes Mother   . CAD Father   . Hypertension Father   . Hypertension Sister   . Diabetes Brother     Social History   Tobacco Use  . Smoking status: Current Every Day Smoker    Packs/day: 0.75    Types: Cigarettes  . Smokeless tobacco: Never Used  Substance Use Topics  . Alcohol use: Yes    Alcohol/week: 0.0 standard drinks    Comment: sometimes  . Drug use: No    Home Medications Prior to Admission medications   Medication Sig Start Date End Date Taking? Authorizing Provider  amLODipine-olmesartan (AZOR) 5-20 MG per tablet Take 1 tablet by mouth daily.     Yes [provider]  Ascorbic Acid (VITAMIN C PO) Take by mouth.   Yes [provider]  clonazePAM (KLONOPIN) 0.5 MG tablet Take 0.5 mg by mouth 2 (two) times daily as needed.   Yes [provider]  dicyclomine (BENTYL) 20 MG tablet Take 1 tablet (20 mg total) by mouth 2 (two) times daily. 09/23/12  Yes Cheron Schaumann K, PA-C  ferrous sulfate 325 (65 FE) MG tablet Take 325  mg by mouth 2 (two) times daily with a meal.   Yes [provider]  fish oil-omega-3 fatty acids 1000 MG capsule Take 1 g by mouth 3 (three) times daily.   Yes [provider]  Flaxseed, Linseed, (FLAXSEED OIL) 1000 MG CAPS Take 1 capsule by mouth 2 (two) times daily.   Yes [provider]  fluticasone (FLONASE) 50 MCG/ACT nasal spray 1-2 SPRAY, NASAL, IN EACH NOSTRIL DAILY 04/17/15  Yes [provider]  Garlic 1000 MG CAPS Take 1 capsule by mouth daily.   Yes [provider]  Multiple Vitamins-Minerals (MULTIVITAMIN WITH MINERALS) tablet Take 1 tablet by mouth daily.     Yes [provider]  RABEprazole (ACIPHEX) 20 MG tablet Take by mouth. 09/26/16  Yes [provider]  VITAMIN D PO Take by mouth.   Yes [provider]  ALPRAZolam (XANAX) 0.25 MG tablet Take 0.125 mg by mouth once as needed. For anxiety    [provider]  BLACK CURRANT SEED OIL PO Take by mouth.    [provider]  cyclobenzaprine (FLEXERIL) 10 MG tablet Take 10 mg by mouth 3 (three) times daily as needed for muscle spasms.    [provider]  DOCOSAHEXAENOIC ACID PO Take 1 g by mouth.    [provider]  esomeprazole (NEXIUM) 40 MG capsule Take 40 mg by mouth daily before breakfast.    [provider]  gabapentin (NEURONTIN) 100 MG capsule Take 100 mg by mouth 3 (three) times daily.    [provider]  meloxicam (MOBIC) 15 MG tablet Take 1 tablet (15 mg total) by mouth daily. TAKE WITH MEALS Patient not taking: Reported on 11/21/2018 08/13/16   Street, [place], PA-C  methocarbamol (ROBAXIN) 500 MG tablet Take 1 tablet (500 mg total) by mouth 3 (three) times daily between meals as needed. 02/12/20   Linwood Dibbles, MD  naproxen (NAPROSYN) 500 MG tablet Take 1 tablet (500 mg total) by mouth 2 (two) times daily. 02/12/20   Linwood Dibbles, MD  neomycin-polymyxin-gramicidin (NEOSPORIN) 1.75-10000-.025 ophthalmic solution  Place 1-2 drops into the right eye 4 (four) times daily.    [provider]  Vitamin D, Ergocalciferol, (DRISDOL) 50000 units CAPS capsule Take 50,000 Units by mouth every 7 (seven) days.    [provider]  famotidine (PEPCID) 20 MG tablet Take 1 tablet (20 mg total) by mouth 2 (two) times daily as needed for heartburn (abdominal pain). 12/08/11 12/21/11  Felisa Bonier, MD  omeprazole (PRILOSEC) 40 MG capsule Take 40 mg by mouth daily.    12/21/11  [provider]    Allergies    Adhesive [tape], Meperidine hcl, Percocet [  oxycodone-acetaminophen], and Septra [bactrim]  Review of Systems   Review of Systems  All other systems reviewed and are negative.   Physical Exam Updated Vital Signs BP (!) 148/98   Pulse 93   Temp 98 F (36.7 C) (Oral)   Resp 19   Ht 1.575 m (5\' 2" )   Wt 59 kg   SpO2 100%   BMI 23.78 kg/m   Physical Exam Vitals and nursing note reviewed.  Constitutional:      Appearance: She is well-developed.     Comments: Appears to be in pain  HENT:     Head: Normocephalic and atraumatic.     Right Ear: External ear normal.     Left Ear: External ear normal.  Eyes:     General: No scleral icterus.       Right eye: No discharge.        Left eye: No discharge.     Conjunctiva/sclera: Conjunctivae normal.  Neck:     Trachea: No tracheal deviation.  Cardiovascular:     Rate and Rhythm: Normal rate and regular rhythm.  Pulmonary:     Effort: Pulmonary effort is normal. No respiratory distress.     Breath sounds: Normal breath sounds. No stridor. No wheezing or rales.  Abdominal:     General: Bowel sounds are normal. There is no distension.     Palpations: Abdomen is soft.     Tenderness: There is no abdominal tenderness. There is no guarding or rebound.  Musculoskeletal:        General: No tenderness.     Cervical back: Neck supple.     Comments: Normal sensation bilateral upper and lower extremities, normal bilateral radial pulses,  mild tenderness palpation left paraspinal and trapezius region  Skin:    General: Skin is warm and dry.     Findings: No rash.  Neurological:     Mental Status: She is alert.     Cranial Nerves: No cranial nerve deficit (no facial droop, extraocular movements intact, no slurred speech).     Sensory: No sensory deficit.     Motor: No abnormal muscle tone or seizure activity.     Coordination: Coordination normal.     ED Results / Procedures / Treatments   Labs (all labs ordered are listed, but only abnormal results are displayed) Labs Reviewed  BASIC METABOLIC PANEL  CBC  PREGNANCY, URINE  CBG MONITORING, ED  TROPONIN I (HIGH SENSITIVITY)    EKG EKG Interpretation  Date/Time:  Wednesday February 12 2020 13:28:32 EDT Ventricular Rate:  94 PR Interval:    QRS Duration: 78 QT Interval:  338 QTC Calculation: 423 R Axis:   60 Text Interpretation: Sinus rhythm No significant change since last tracing Confirmed by 09-01-1994 [phone]) on 02/12/2020 1:58:26 PM   Radiology DG Chest 2 View  Result Date: 02/12/2020 CLINICAL DATA:  Chest pain. EXAM: CHEST - 2 VIEW COMPARISON:  December 23, 2016. FINDINGS: The heart size and mediastinal contours are within normal limits. Both lungs are clear. No pneumothorax or pleural effusion is noted. The visualized skeletal structures are unremarkable. IMPRESSION: No active cardiopulmonary disease. Electronically Signed   By: December 25, 2016 M.D.   On: 02/12/2020 15:00   DG Cervical Spine Complete  Result Date: 02/12/2020 CLINICAL DATA:  Left lateral neck pain and chest pain. EXAM: CERVICAL SPINE - COMPLETE 4+ VIEW COMPARISON:  None. FINDINGS: Straightening of the normal cervical lordosis. C4-5 and C5-6 degenerative disc disease. Lateral masses articulate appropriately with  the dens. Prevertebral soft tissues unremarkable. Lung apices are unremarkable. IMPRESSION: Degenerative disc disease. Electronically Signed   By: Lovey Newcomer M.D.   On: 02/12/2020 15:00      Procedures Procedures (including critical care time)  Medications Ordered in ED Medications  aspirin chewable tablet 324 mg (324 mg Oral Given 02/12/20 1355)  fentaNYL (SUBLIMAZE) injection 50 mcg (50 mcg Intravenous Given 02/12/20 1352)    ED Course  I have reviewed the triage vital signs and the nursing notes.  Pertinent labs & imaging results that were available during my care of the patient were reviewed by me and considered in my medical decision making (see chart for details).  Clinical Course as of Feb 12 1531  Wed Feb 12, 2020  1444 Labs reviewed.  CBC troponin metabolic panel pregnancy test all negative.  Heart pathway following.  No repeat troponin necessary.   [JK]    Clinical Course User Index [JK] Dorie Rank, MD  Previous records reviewed.  Patient had an MRI of the spine in February 2020.  At that time she had a right paracentral disc protrusion at C5 and 6.  Patient was seen at an urgent care on March 29 and at that time diagnosed with arthritis of the left before meals and glenohumeral region MDM Rules/Calculators/A&P HEAR Score: 3                    Pt presented with arm pain.  Low risk heart score.  Troponin is normal.  Heart pathway followed.  Pt stable for outpt follow up.  Suspect sx related to cervical radiculopathy.  Will dc home with nsaids, muscle relexant.  Pt has plans to follow up with her orthopedist and will be getting a stress test.  Final Clinical Impression(s) / ED Diagnoses Final diagnoses:  Cervical radiculopathy    Rx / DC Orders ED Discharge Orders         Ordered    naproxen (NAPROSYN) 500 MG tablet  2 times daily     02/12/20 1531    methocarbamol (ROBAXIN) 500 MG tablet  3 times daily between meals PRN     02/12/20 1531           Dorie Rank, MD 02/12/20 1534

## 2020-02-12 NOTE — Discharge Instructions (Addendum)
Try taking the nsaids and muscle relaxant.  Follow up with your primary care doctor and orthopedic doctor as planned

## 2020-02-12 NOTE — ED Triage Notes (Addendum)
Pt reports left arm/neck pain for several months. She was seen by her PCP on Monday and they did an ekg which was "abnormal". She is scheduled for a stress test next Monday. She is here b/c the pain is worse. Pt tearful, states she took tylenol at 1030 this am. Dr. Lynelle Doctor at bedside during triage

## 2020-08-16 ENCOUNTER — Encounter (HOSPITAL_BASED_OUTPATIENT_CLINIC_OR_DEPARTMENT_OTHER): Payer: Self-pay | Admitting: Emergency Medicine

## 2020-08-16 ENCOUNTER — Emergency Department (HOSPITAL_BASED_OUTPATIENT_CLINIC_OR_DEPARTMENT_OTHER)
Admission: EM | Admit: 2020-08-16 | Discharge: 2020-08-16 | Disposition: A | Discharge status: Home / Self Care | Payer: BC Managed Care – PPO | Attending: Emergency Medicine | Admitting: Emergency Medicine

## 2020-08-16 DIAGNOSIS — S6992XA Unspecified injury of left wrist, hand and finger(s), initial encounter: Secondary | ICD-10-CM | POA: Diagnosis present

## 2020-08-16 DIAGNOSIS — I10 Essential (primary) hypertension: Secondary | ICD-10-CM | POA: Insufficient documentation

## 2020-08-16 DIAGNOSIS — W260XXA Contact with knife, initial encounter: Secondary | ICD-10-CM | POA: Diagnosis not present

## 2020-08-16 DIAGNOSIS — S61012A Laceration without foreign body of left thumb without damage to nail, initial encounter: Secondary | ICD-10-CM | POA: Insufficient documentation

## 2020-08-16 DIAGNOSIS — F17210 Nicotine dependence, cigarettes, uncomplicated: Secondary | ICD-10-CM | POA: Diagnosis not present

## 2020-08-16 DIAGNOSIS — Z79899 Other long term (current) drug therapy: Secondary | ICD-10-CM | POA: Diagnosis not present

## 2020-08-16 DIAGNOSIS — Y92000 Kitchen of unspecified non-institutional (private) residence as  the place of occurrence of the external cause: Secondary | ICD-10-CM | POA: Insufficient documentation

## 2020-08-16 DIAGNOSIS — Z23 Encounter for immunization: Secondary | ICD-10-CM | POA: Insufficient documentation

## 2020-08-16 MED ORDER — LIDOCAINE HCL (PF) 1 % IJ SOLN
10.0000 mL | Freq: Once | INTRAMUSCULAR | Status: AC
  Administered 2020-08-16: 10 mL
  Filled 2020-08-16: qty 10

## 2020-08-16 MED ORDER — TETANUS-DIPHTH-ACELL PERTUSSIS 5-2.5-18.5 LF-MCG/0.5 IM SUSY
0.5000 mL | PREFILLED_SYRINGE | Freq: Once | INTRAMUSCULAR | Status: AC
  Administered 2020-08-16: 0.5 mL via INTRAMUSCULAR
  Filled 2020-08-16: qty 0.5

## 2020-08-16 NOTE — ED Triage Notes (Signed)
Laceration to L thumb with kitchen knife.

## 2020-08-16 NOTE — Discharge Instructions (Signed)
Please read and follow all provided instructions.  Your diagnoses today include:  1. Laceration of left thumb without foreign body without damage to nail, initial encounter     Tests performed today include:  Vital signs. See below for your results today.   Medications prescribed:   None  Take any prescribed medications only as directed.   Home care instructions:  Follow any educational materials and wound care instructions contained in this packet.   Keep affected area above the level of your heart when possible to minimize swelling. Wash area gently twice a day with warm soapy water. Do not apply alcohol or hydrogen peroxide. Cover the area if it draining or weeping.   Follow-up instructions: Suture Removal: Return to the Emergency Department or see your primary care care doctor in 14 days for a recheck of your wound and removal of your sutures or staples.    Return instructions:  Return to the Emergency Department if you have:  Fever  Worsening pain  Worsening swelling of the wound  Pus draining from the wound  Redness of the skin that moves away from the wound, especially if it streaks away from the affected area   Any other emergent concerns  Your vital signs today were: BP (!) 147/105 (BP Location: Left Arm)    Pulse (!) 104    Temp 98.8 F (37.1 C) (Oral)    Resp 18    Ht 5\' 1"  (1.549 m)    Wt 58.1 kg    SpO2 100%    BMI 24.19 kg/m  If your blood pressure (BP) was elevated above 135/85 this visit, please have this repeated by your doctor within one month. --------------

## 2020-08-16 NOTE — ED Provider Notes (Signed)
[facility] EMERGENCY DEPARTMENT Provider Note   CSN: [id number] Arrival date & time: 08/16/20  1406     History Chief Complaint  Patient presents with  . Laceration    Natasha Hickman is a 54 y.o. female.  Patient presents emergency department today for evaluation of laceration to the inside of the base of her left thumb.  Injury occurred just prior to arrival while she was using a clean kitchen knife.  She denies numbness or tingling in the tip of the finger.  Tetanus is not up-to-date.  She denies other injuries.  Wound was cleaned with peroxide prior to arrival.        Past Medical History:  Diagnosis Date  . Acid reflux   . Anxiety   . Barrett's esophagus   . DDD (degenerative disc disease), cervical   . Fatigue   . GERD (gastroesophageal reflux disease)   . Hiatal hernia   . High risk medication use   . Hypertension   . IBS (irritable bowel syndrome)    No longer have  . Migraines   . Mixed hyperlipidemia   . Renal disorder     Patient Active Problem List   Diagnosis Date Noted  . Chronic right-sided low back pain with right-sided sciatica 11/21/2018  . Hand numbness 11/21/2018  . Lumbar radiculopathy 11/21/2018  . Ingrown nail 08/08/2014  . Pain in lower limb 08/08/2014  . Abdominal pain 06/28/2013  . HEADACHE 01/26/2009  . GERD 01/09/2009  . ALLERGIC RHINITIS 01/06/2009  . DIARRHEA 01/06/2009  . UNSPECIFIED DISORDER OF ESOPHAGUS 12/15/2008  . ANXIETY STATE, UNSPECIFIED 11/18/2008  . TOBACCO ABUSE 11/18/2008  . HYPERTENSION 11/18/2008  . DYSPNEA 11/18/2008    Past Surgical History:  Procedure Laterality Date  . ABDOMINAL HYSTERECTOMY    . ESOPHAGOGASTRIC FUNDOPLASTY    . GASTRECTOMY    . SHOULDER SURGERY Right    arthroscopic      OB History   No obstetric history on file.     Family History  Problem Relation Age of Onset  . CAD Mother   . Hypertension Mother   . Diabetes Mother   . CAD Father   . Hypertension Father     . Hypertension Sister   . Diabetes Brother     Social History   Tobacco Use  . Smoking status: Current Every Day Smoker    Packs/day: 0.75    Types: Cigarettes  . Smokeless tobacco: Never Used  Vaping Use  . Vaping Use: Never used  Substance Use Topics  . Alcohol use: Yes    Alcohol/week: 0.0 standard drinks    Comment: sometimes  . Drug use: No    Home Medications Prior to Admission medications   Medication Sig Start Date End Date Taking? Authorizing Provider  ALPRAZolam (XANAX) 0.25 MG tablet Take 0.125 mg by mouth once as needed. For anxiety    [provider]  amLODipine-olmesartan (AZOR) 5-20 MG per tablet Take 1 tablet by mouth daily.      [provider]  Ascorbic Acid (VITAMIN C PO) Take by mouth.    [provider]  BLACK CURRANT SEED OIL PO Take by mouth.    [provider]  clonazePAM (KLONOPIN) 0.5 MG tablet Take 0.5 mg by mouth 2 (two) times daily as needed.    [provider]  cyclobenzaprine (FLEXERIL) 10 MG tablet Take 10 mg by mouth 3 (three) times daily as needed for muscle spasms.    [provider]  dicyclomine (BENTYL) 20 MG tablet Take 1 tablet (20 mg total) by mouth 2 (two) times daily. 09/23/12   Elson Areas, PA-C  DOCOSAHEXAENOIC ACID PO Take 1 g by mouth.    [provider]  esomeprazole (NEXIUM) 40 MG capsule Take 40 mg by mouth daily before breakfast.    [provider]  ferrous sulfate 325 (65 FE) MG tablet Take 325 mg by mouth 2 (two) times daily with a meal.    [provider]  fish oil-omega-3 fatty acids 1000 MG capsule Take 1 g by mouth 3 (three) times daily.    [provider]  Flaxseed, Linseed, (FLAXSEED OIL) 1000 MG CAPS Take 1 capsule by mouth 2 (two) times daily.    [provider]  fluticasone (FLONASE) 50 MCG/ACT nasal spray 1-2 SPRAY, NASAL, IN EACH NOSTRIL DAILY 04/17/15   [provider]  gabapentin (NEURONTIN) 100 MG capsule  Take 100 mg by mouth 3 (three) times daily.    [provider]  Garlic 1000 MG CAPS Take 1 capsule by mouth daily.    [provider]  meloxicam (MOBIC) 15 MG tablet Take 1 tablet (15 mg total) by mouth daily. TAKE WITH MEALS Patient not taking: Reported on 11/21/2018 08/13/16   Street, [place], PA-C  methocarbamol (ROBAXIN) 500 MG tablet Take 1 tablet (500 mg total) by mouth 3 (three) times daily between meals as needed. 02/12/20   Linwood Dibbles, MD  Multiple Vitamins-Minerals (MULTIVITAMIN WITH MINERALS) tablet Take 1 tablet by mouth daily.      [provider]  naproxen (NAPROSYN) 500 MG tablet Take 1 tablet (500 mg total) by mouth 2 (two) times daily. 02/12/20   Linwood Dibbles, MD  neomycin-polymyxin-gramicidin (NEOSPORIN) 1.75-10000-.025 ophthalmic solution Place 1-2 drops into the right eye 4 (four) times daily.    [provider]  RABEprazole (ACIPHEX) 20 MG tablet Take by mouth. 09/26/16   [provider]  VITAMIN D PO Take by mouth.    [provider]  Vitamin D, Ergocalciferol, (DRISDOL) 50000 units CAPS capsule Take 50,000 Units by mouth every 7 (seven) days.    [provider]  famotidine (PEPCID) 20 MG tablet Take 1 tablet (20 mg total) by mouth 2 (two) times daily as needed for heartburn (abdominal pain). 12/08/11 12/21/11  Felisa Bonier, MD  omeprazole (PRILOSEC) 40 MG capsule Take 40 mg by mouth daily.    12/21/11  [provider]    Allergies    Adhesive [tape], Meperidine hcl, Percocet [oxycodone-acetaminophen], and Septra [bactrim]  Review of Systems   Review of Systems  Musculoskeletal: Positive for myalgias.  Skin: Positive for wound.  Neurological: Negative for numbness.    Physical Exam Updated Vital Signs BP (!) 147/105 (BP Location: Left Arm)   Pulse (!) 104   Temp 98.8 F (37.1 C) (Oral)   Resp 18   Ht 5\' 1"  (1.549 m)   Wt 58.1 kg   SpO2 100%   BMI 24.19 kg/m   Physical Exam Vitals and  nursing note reviewed.  Constitutional:      Appearance: She is well-developed.  HENT:     Head: Normocephalic and atraumatic.  Eyes:     Conjunctiva/sclera: Conjunctivae normal.  Pulmonary:     Effort: No respiratory distress.  Musculoskeletal:     Cervical back: Normal range of motion and neck supple.     Comments: 1 cm laceration to the medial aspect of the left thumb.  Wound base visualized and fully explored.  Wound extends to the subcutaneous tissue.  No visible tendon or nerve injury.  Patient has full range of motion of the thumb in flexion/extension, abduction/abduction.  No foreign bodies visualized or palpated.  Skin:    General: Skin is warm and dry.  Neurological:     Mental Status: She is alert.     Comments: Sensation distal to injury on both the radial and ulnar aspect of the thumb preserved.  Normal motor function of the thumb as well.     ED Results / Procedures / Treatments   Labs (all labs ordered are listed, but only abnormal results are displayed) Labs Reviewed - No data to display  EKG None  Radiology No results found.  Procedures Procedures (including critical care time)  Medications Ordered in ED Medications  Tdap (BOOSTRIX) injection 0.5 mL (has no administration in time range)  lidocaine (PF) (XYLOCAINE) 1 % injection 10 mL (10 mLs Infiltration Given by Other 08/16/20 1543)    ED Course  I have reviewed the triage vital signs and the nursing notes.  Pertinent labs & imaging results that were available during my care of the patient were reviewed by me and considered in my medical decision making (see chart for details).   Patient seen and examined. Work-up initiated. Medications ordered.   Vital signs reviewed and are as follows: BP (!) 147/105 (BP Location: Left Arm)   Pulse (!) 104   Temp 98.8 F (37.1 C) (Oral)   Resp 18   Ht 5\' 1"  (1.549 m)   Wt 58.1 kg   SpO2 100%   BMI 24.19 kg/m   4:20 PM wound repaired without complications.   Patient counseled on wound care. Patient counseled on need to return or see PCP/urgent care for suture removal in 14 days. Patient was urged to return to the Emergency Department urgently with worsening pain, swelling, expanding erythema especially if it streaks away from the affected area, fever, or if they have any other concerns. Patient verbalized understanding.     MDM Rules/Calculators/A&P                          Patient with thumb laceration, repaired without complication.  Wound is fairly shallow.  Do not suspect significant circulation, motor, sensation disruption.  Motor intact and sensation intact at time of exam.   Final Clinical Impression(s) / ED Diagnoses Final diagnoses:  Laceration of left thumb without foreign body without damage to nail, initial encounter    Rx / DC Orders ED Discharge Orders    None       , PA-C 08/16/20 1622    08/18/20, DO 08/16/20 1739

## 2020-10-26 ENCOUNTER — Telehealth: Payer: Self-pay | Admitting: Neurology

## 2020-10-26 NOTE — Telephone Encounter (Signed)
Pt called wanting to discuss the pain and tingling she is having in her lower extremities with RN. Please advise.

## 2020-10-26 NOTE — Telephone Encounter (Signed)
Natasha Hickman, we have not seen patient in over a year. If she wants to discuss her lower extremity pain she has to see her primary care or schedule an appointment her at [facility] She can see an NP, Amy or Megan. Please call if you don't mind, thanks Toma Copier [phone])

## 2020-10-27 ENCOUNTER — Telehealth: Payer: Self-pay | Admitting: Neurology

## 2020-10-27 NOTE — Telephone Encounter (Signed)
I gave pt the first available appt between Dr. Lucia Gaskins, Amy and [place]. 11/18/20 at 3:30pm with Amy. I had to leave her a voicemail letting her know and to call back if that day and time doesn't work for her. Added her to wait list.

## 2020-11-06 ENCOUNTER — Encounter (HOSPITAL_BASED_OUTPATIENT_CLINIC_OR_DEPARTMENT_OTHER): Payer: Self-pay | Admitting: Emergency Medicine

## 2020-11-06 ENCOUNTER — Other Ambulatory Visit: Payer: Self-pay

## 2020-11-06 ENCOUNTER — Emergency Department (HOSPITAL_BASED_OUTPATIENT_CLINIC_OR_DEPARTMENT_OTHER)
Admission: EM | Admit: 2020-11-06 | Discharge: 2020-11-06 | Disposition: A | Discharge status: Home / Self Care | Payer: BC Managed Care – PPO | Attending: Emergency Medicine | Admitting: Emergency Medicine

## 2020-11-06 ENCOUNTER — Emergency Department (HOSPITAL_BASED_OUTPATIENT_CLINIC_OR_DEPARTMENT_OTHER): Payer: BC Managed Care – PPO

## 2020-11-06 DIAGNOSIS — M79602 Pain in left arm: Secondary | ICD-10-CM | POA: Insufficient documentation

## 2020-11-06 DIAGNOSIS — I10 Essential (primary) hypertension: Secondary | ICD-10-CM | POA: Insufficient documentation

## 2020-11-06 DIAGNOSIS — R531 Weakness: Secondary | ICD-10-CM | POA: Diagnosis not present

## 2020-11-06 DIAGNOSIS — F17210 Nicotine dependence, cigarettes, uncomplicated: Secondary | ICD-10-CM | POA: Insufficient documentation

## 2020-11-06 DIAGNOSIS — Z79899 Other long term (current) drug therapy: Secondary | ICD-10-CM | POA: Diagnosis not present

## 2020-11-06 LAB — TROPONIN I (HIGH SENSITIVITY): Troponin I (High Sensitivity): <2 ng/L (ref <18)

## 2020-11-06 LAB — CBC
HCT: 37.9 % (ref 36.0–46.0)
Hemoglobin: 12.8 g/dL (ref 12.0–15.0)
MCH: 31.2 pg (ref 26.0–34.0)
MCHC: 33.8 g/dL (ref 30.0–36.0)
MCV: 92.4 fL (ref 80.0–100.0)
Platelets: 225 10*3/uL (ref 150–400)
RBC: 4.1 MIL/uL (ref 3.87–5.11)
RDW: 12.7 % (ref 11.5–15.5)
WBC: 4.7 10*3/uL (ref 4.0–10.5)
nRBC: 0 % (ref 0.0–0.2)

## 2020-11-06 LAB — BASIC METABOLIC PANEL
Anion gap: 12 (ref 5–15)
BUN: 11 mg/dL (ref 6–20)
CO2: 24 mmol/L (ref 22–32)
Calcium: 10.1 mg/dL (ref 8.9–10.3)
Chloride: 101 mmol/L (ref 98–111)
Creatinine, Ser: 0.73 mg/dL (ref 0.44–1.00)
GFR, Estimated: >60 mL/min (ref >60)
Glucose, Bld: 108 mg/dL — ABNORMAL HIGH (ref 70–99)
Potassium: 3.6 mmol/L (ref 3.5–5.1)
Sodium: 137 mmol/L (ref 135–145)

## 2020-11-06 MED ORDER — FENTANYL CITRATE (PF) 100 MCG/2ML IJ SOLN
50.0000 ug | Freq: Once | INTRAMUSCULAR | Status: AC
  Administered 2020-11-06: 50 ug via INTRAVENOUS
  Filled 2020-11-06: qty 2

## 2020-11-06 MED ORDER — ONDANSETRON HCL 4 MG/2ML IJ SOLN
4.0000 mg | Freq: Once | INTRAMUSCULAR | Status: AC
  Administered 2020-11-06: 4 mg via INTRAVENOUS
  Filled 2020-11-06: qty 2

## 2020-11-06 MED ORDER — CYCLOBENZAPRINE HCL 10 MG PO TABS: 10.0000 mg | ORAL_TABLET | Freq: Two times a day (BID) | ORAL | 0 refills | Status: DC | PRN

## 2020-11-06 MED ORDER — MELOXICAM 7.5 MG PO TABS: 7.5000 mg | ORAL_TABLET | Freq: Every day | ORAL | 0 refills | Status: DC

## 2020-11-06 NOTE — Discharge Instructions (Signed)
Please read and follow all provided instructions.  Your diagnoses today include:  1. Pain of left upper extremity     Tests performed today include:  Vital signs - see below for your results today Blood cell counts (white, red, and platelets) Electrolytes  Kidney function test EKG Chest x-ray Troponin - did not show any sign of stress on the heart   Medications prescribed:   Flexeril (cyclobenzaprine) - muscle relaxer medication  DO NOT drive or perform any activities that require you to be awake and alert because this medicine can make you drowsy.    Meloxicam - anti-inflammatory pain medication  You have been prescribed an anti-inflammatory medication or NSAID. Take with food. Do not take aspirin, ibuprofen, or naproxen if taking this medication. Take smallest effective dose for the shortest duration needed for your pain. Stop taking if you experience stomach pain or vomiting.   Take any prescribed medications only as directed.  Home care instructions:   Follow any educational materials contained in this packet  Please rest, use ice or heat on your neck and shoulder for the next several days  Do not lift, push, pull anything more than 10 pounds for the next week  Follow-up instructions: Please follow-up with your primary care provider in the next 1 week for further evaluation of your symptoms.   Return instructions:  SEEK IMMEDIATE MEDICAL ATTENTION IF YOU HAVE:  New numbness, tingling, weakness, or problem with the use of your arms or legs  Severe back pain not relieved with medications  Loss control of your bowels or bladder  Increasing pain in any areas of the body (such as chest or abdominal pain)  Shortness of breath, dizziness, or fainting.   Worsening nausea (feeling sick to your stomach), vomiting, fever, or sweats  Any other emergent concerns regarding your health   Additional Information:  Your vital signs today were: BP (!) 154/104   Pulse 79    Temp 98.1 F (36.7 C) (Oral)   Resp 19   Ht 5\' 1"  (1.549 m)   Wt 57.2 kg   SpO2 100%   BMI 23.81 kg/m  If your blood pressure (BP) was elevated above 135/85 this visit, please have this repeated by your doctor within one month. --------------

## 2020-11-06 NOTE — ED Triage Notes (Signed)
Reports left arm pain that started yesterday.  Reports it eased up after taking aspirin.  Does endorse pain radiates into the left chest and upper left back.

## 2020-11-06 NOTE — ED Provider Notes (Signed)
[facility] EMERGENCY DEPARTMENT Provider Note   CSN: [id number] Arrival date & time: 11/06/20  1742     History Chief Complaint  Patient presents with  . Arm Pain  . Chest Pain    Natasha GravelKathy C Hickman is a 55 y.o. female.  Patient presents to the emergency department for evaluation of left shoulder and arm pain starting yesterday.  Patient describes a burning pain into her left upper arm.  She reports mild subjective weakness.  No chest pain.  No pain into the neck or jaw.  She denies associated diaphoresis or vomiting.  Symptoms are nonexertional.  She took aspirin today which helped improve the pain a bit.  No significant shortness of breath cough or fever.  No URI symptoms.  Patient thinks that she has had bulging disks in the neck in the past.  She has had similar pain to this but she reports never this severe. The onset of this condition was acute. The course is constant. Aggravating factors: palpation. Alleviating factors: none.   MRI from February 2020 with degenerative disc disease, C5-6 foraminal narrowing on the right, not towards the left.        Past Medical History:  Diagnosis Date  . Acid reflux   . Anxiety   . Barrett's esophagus   . DDD (degenerative disc disease), cervical   . Fatigue   . GERD (gastroesophageal reflux disease)   . Hiatal hernia   . High risk medication use   . Hypertension   . IBS (irritable bowel syndrome)    No longer have  . Migraines   . Mixed hyperlipidemia   . Renal disorder     Patient Active Problem List   Diagnosis Date Noted  . Chronic right-sided low back pain with right-sided sciatica 11/21/2018  . Hand numbness 11/21/2018  . Lumbar radiculopathy 11/21/2018  . Ingrown nail 08/08/2014  . Pain in lower limb 08/08/2014  . Abdominal pain 06/28/2013  . HEADACHE 01/26/2009  . GERD 01/09/2009  . ALLERGIC RHINITIS 01/06/2009  . DIARRHEA 01/06/2009  . UNSPECIFIED DISORDER OF ESOPHAGUS 12/15/2008  . ANXIETY STATE,  UNSPECIFIED 11/18/2008  . TOBACCO ABUSE 11/18/2008  . HYPERTENSION 11/18/2008  . DYSPNEA 11/18/2008    Past Surgical History:  Procedure Laterality Date  . ABDOMINAL HYSTERECTOMY    . ESOPHAGOGASTRIC FUNDOPLASTY    . GASTRECTOMY    . SHOULDER SURGERY Right    arthroscopic      OB History   No obstetric history on file.     Family History  Problem Relation Age of Onset  . CAD Mother   . Hypertension Mother   . Diabetes Mother   . CAD Father   . Hypertension Father   . Hypertension Sister   . Diabetes Brother     Social History   Tobacco Use  . Smoking status: Current Every Day Smoker    Packs/day: 0.75    Types: Cigarettes  . Smokeless tobacco: Never Used  Vaping Use  . Vaping Use: Never used  Substance Use Topics  . Alcohol use: Yes    Alcohol/week: 0.0 standard drinks    Comment: sometimes  . Drug use: No    Home Medications Prior to Admission medications   Medication Sig Start Date End Date Taking? Authorizing Provider  ALPRAZolam (XANAX) 0.25 MG tablet Take 0.125 mg by mouth once as needed. For anxiety    [provider]  amLODipine-olmesartan (AZOR) 5-20 MG per tablet Take 1 tablet by mouth daily.  [provider]  Ascorbic Acid (VITAMIN C PO) Take by mouth.    [provider]  BLACK CURRANT SEED OIL PO Take by mouth.    [provider]  clonazePAM (KLONOPIN) 0.5 MG tablet Take 0.5 mg by mouth 2 (two) times daily as needed.    [provider]  cyclobenzaprine (FLEXERIL) 10 MG tablet Take 10 mg by mouth 3 (three) times daily as needed for muscle spasms.    [provider]  dicyclomine (BENTYL) 20 MG tablet Take 1 tablet (20 mg total) by mouth 2 (two) times daily. 09/23/12   Elson Areas, PA-C  DOCOSAHEXAENOIC ACID PO Take 1 g by mouth.    [provider]  esomeprazole (NEXIUM) 40 MG capsule Take 40 mg by mouth daily before breakfast.    [provider]  ferrous sulfate 325 (65  FE) MG tablet Take 325 mg by mouth 2 (two) times daily with a meal.    [provider]  fish oil-omega-3 fatty acids 1000 MG capsule Take 1 g by mouth 3 (three) times daily.    [provider]  Flaxseed, Linseed, (FLAXSEED OIL) 1000 MG CAPS Take 1 capsule by mouth 2 (two) times daily.    [provider]  fluticasone (FLONASE) 50 MCG/ACT nasal spray 1-2 SPRAY, NASAL, IN EACH NOSTRIL DAILY 04/17/15   [provider]  gabapentin (NEURONTIN) 100 MG capsule Take 100 mg by mouth 3 (three) times daily.    [provider]  Garlic 1000 MG CAPS Take 1 capsule by mouth daily.    [provider]  meloxicam (MOBIC) 15 MG tablet Take 1 tablet (15 mg total) by mouth daily. TAKE WITH MEALS Patient not taking: Reported on 11/21/2018 08/13/16   Street, [place], PA-C  methocarbamol (ROBAXIN) 500 MG tablet Take 1 tablet (500 mg total) by mouth 3 (three) times daily between meals as needed. 02/12/20   Linwood Dibbles, MD  Multiple Vitamins-Minerals (MULTIVITAMIN WITH MINERALS) tablet Take 1 tablet by mouth daily.      [provider]  naproxen (NAPROSYN) 500 MG tablet Take 1 tablet (500 mg total) by mouth 2 (two) times daily. 02/12/20   Linwood Dibbles, MD  neomycin-polymyxin-gramicidin (NEOSPORIN) 1.75-10000-.025 ophthalmic solution Place 1-2 drops into the right eye 4 (four) times daily.    [provider]  RABEprazole (ACIPHEX) 20 MG tablet Take by mouth. 09/26/16   [provider]  VITAMIN D PO Take by mouth.    [provider]  Vitamin D, Ergocalciferol, (DRISDOL) 50000 units CAPS capsule Take 50,000 Units by mouth every 7 (seven) days.    [provider]  famotidine (PEPCID) 20 MG tablet Take 1 tablet (20 mg total) by mouth 2 (two) times daily as needed for heartburn (abdominal pain). 12/08/11 12/21/11  Felisa Bonier, MD  omeprazole (PRILOSEC) 40 MG capsule Take 40 mg by mouth daily.    12/21/11  [provider]     Allergies    Adhesive [tape], Meperidine hcl, Percocet [oxycodone-acetaminophen], and Septra [bactrim]  Review of Systems   Review of Systems  Constitutional: Negative for diaphoresis and fever.  Eyes: Negative for redness.  Respiratory: Negative for cough and shortness of breath.   Cardiovascular: Negative for chest pain, palpitations and leg swelling.  Gastrointestinal: Negative for abdominal pain, nausea and vomiting.  Genitourinary: Negative for dysuria.  Musculoskeletal: Positive for arthralgias and myalgias. Negative for back pain and neck pain.  Skin: Negative for rash.  Neurological: Negative for syncope, light-headedness and  numbness.  Psychiatric/Behavioral: The patient is not nervous/anxious.     Physical Exam Updated Vital Signs BP (!) 175/107 (BP Location: Left Arm)   Pulse 86   Temp 98.1 F (36.7 C) (Oral)   Resp 18   Ht 5\' 1"  (1.549 m)   Wt 57.2 kg   SpO2 100%   BMI 23.81 kg/m   Physical Exam Vitals and nursing note reviewed.  Constitutional:      Appearance: She is well-developed and well-nourished. She is not diaphoretic.  HENT:     Head: Normocephalic and atraumatic.     Mouth/Throat:     Mouth: Mucous membranes are normal. Mucous membranes are not dry.  Eyes:     Conjunctiva/sclera: Conjunctivae normal.  Neck:     Vascular: Normal carotid pulses. No carotid bruit or JVD.     Trachea: Trachea normal. No tracheal deviation.  Cardiovascular:     Rate and Rhythm: Normal rate and regular rhythm.     Pulses: Intact distal pulses. No decreased pulses.          Radial pulses are 2+ on the right side and 2+ on the left side.     Heart sounds: Normal heart sounds, S1 normal and S2 normal. No murmur heard.   Pulmonary:     Effort: Pulmonary effort is normal. No respiratory distress.     Breath sounds: No wheezing.  Chest:     Chest wall: No tenderness.  Abdominal:     General: Bowel sounds are normal. Aorta is normal.     Palpations: Abdomen is  soft.     Tenderness: There is no abdominal tenderness. There is no guarding or rebound.  Musculoskeletal:        General: Normal range of motion.     Left shoulder: Tenderness present. No bony tenderness. Normal range of motion.     Left upper arm: No tenderness.     Left elbow: Normal range of motion. No tenderness.     Left forearm: No tenderness.     Left wrist: No tenderness. Normal range of motion.     Cervical back: Normal range of motion and neck supple. No spasms or tenderness. No muscular tenderness.     Thoracic back: No spasms or tenderness.  Skin:    General: Skin is warm and dry.     Coloration: Skin is not pale.     Nails: There is no cyanosis.  Neurological:     Mental Status: She is alert.     Comments: Upper extremity myotomes tested left upper extremity:  C5 Shoulder abduction 5/5 C6 Elbow flexion/wrist extension 5/5 C7 Elbow extension 5/5 C8 Finger flexion 5/5 T1 Finger abduction 5/5   Psychiatric:        Mood and Affect: Mood and affect normal.     ED Results / Procedures / Treatments   Labs (all labs ordered are listed, but only abnormal results are displayed) Labs Reviewed  BASIC METABOLIC PANEL  CBC  TROPONIN I (HIGH SENSITIVITY)    EKG EKG Interpretation  Date/Time:  Friday November 06 2020 17:44:06 EST Ventricular Rate:  89 PR Interval:  148 QRS Duration: 72 QT Interval:  362 QTC Calculation: 440 R Axis:   79 Text Interpretation: Normal sinus rhythm no acute STEMI No significant change since last tracing Confirmed by [id number] (Marianna Fuss) on 11/06/2020 5:58:06 PM   Radiology DG Chest 2 View  Result Date: 11/06/2020 CLINICAL DATA:  cp EXAM: CHEST - 2 VIEW COMPARISON:  02/12/2020 and  prior. FINDINGS: No focal consolidation. No pneumothorax or pleural effusion. Cardiomediastinal silhouette is within normal limits. No acute osseous abnormality. IMPRESSION: No focal airspace disease. Electronically Signed   By: Stana Bunting M.D.   On:  11/06/2020 18:14    Procedures Procedures (including critical care time)  Medications Ordered in ED Medications  ondansetron (ZOFRAN) injection 4 mg (has no administration in time range)  fentaNYL (SUBLIMAZE) injection 50 mcg (has no administration in time range)    ED Course  I have reviewed the triage vital signs and the nursing notes.  Pertinent labs & imaging results that were available during my care of the patient were reviewed by me and considered in my medical decision making (see chart for details).  Patient seen and examined. Work-up initiated.  EKG and chest x-ray reviewed.  Medications ordered.   Vital signs reviewed and are as follows: BP (!) 175/107 (BP Location: Left Arm)   Pulse 86   Temp 98.1 F (36.7 C) (Oral)   Resp 18   Ht 5\' 1"  (1.549 m)   Wt 57.2 kg   SpO2 100%   BMI 23.81 kg/m   Symptoms seem neuropathic in nature.  We will rule out cardiac etiology.  Low concern for blood clot.  No pneumothorax seen on x-ray.  No abdominal pain.  7:31 PM patient feeling better after treatment.  We discussed her results tonight.  Encouraged PCP follow-up in the next week for recheck.  Plan for discharge home with muscle relaxer and anti-inflammatories.  She will continue taking her home blood pressure medications.  Patient was counseled to return with severe chest pain, especially if the pain is crushing or pressure-like and spreads to the arms, back, neck, or jaw, or if they have sweating, nausea, or shortness of breath with the pain. They were encouraged to call 911 with these symptoms.   The patient verbalized understanding and agreed.     MDM Rules/Calculators/A&P                          Patient with pain in the left shoulder with radiation into the left arm, burning in nature, concerning for cervical radiculopathy.  Patient has a history of bulging disks in the neck.  Cardiac work-up is negative.  Low concern for ACS, PE, dissection.  No skin findings concerning  for cellulitis or shingles.  Patient feels better after treatment here.  Plan for discharge home as above.   Final Clinical Impression(s) / ED Diagnoses Final diagnoses:  Pain of left upper extremity    Rx / DC Orders ED Discharge Orders         Ordered    cyclobenzaprine (FLEXERIL) 10 MG tablet  2 times daily PRN        11/06/20 1927    meloxicam (MOBIC) 7.5 MG tablet  Daily        11/06/20 1927           11/08/20, Renne Crigler 11/06/20 1931    11/08/20, MD 11/09/20 1524

## 2020-11-18 ENCOUNTER — Encounter: Payer: Self-pay | Admitting: Family Medicine

## 2020-11-18 ENCOUNTER — Ambulatory Visit: Payer: BC Managed Care – PPO | Admitting: Family Medicine

## 2021-04-13 ENCOUNTER — Emergency Department (HOSPITAL_BASED_OUTPATIENT_CLINIC_OR_DEPARTMENT_OTHER)
Admission: EM | Admit: 2021-04-13 | Discharge: 2021-04-13 | Disposition: A | Discharge status: Home / Self Care | Payer: BC Managed Care – PPO | Attending: Emergency Medicine | Admitting: Emergency Medicine

## 2021-04-13 ENCOUNTER — Other Ambulatory Visit: Payer: Self-pay

## 2021-04-13 ENCOUNTER — Emergency Department (HOSPITAL_BASED_OUTPATIENT_CLINIC_OR_DEPARTMENT_OTHER): Payer: BC Managed Care – PPO

## 2021-04-13 ENCOUNTER — Encounter (HOSPITAL_BASED_OUTPATIENT_CLINIC_OR_DEPARTMENT_OTHER): Payer: Self-pay | Admitting: *Deleted

## 2021-04-13 DIAGNOSIS — Z87891 Personal history of nicotine dependence: Secondary | ICD-10-CM | POA: Insufficient documentation

## 2021-04-13 DIAGNOSIS — M79602 Pain in left arm: Secondary | ICD-10-CM | POA: Insufficient documentation

## 2021-04-13 DIAGNOSIS — M62838 Other muscle spasm: Secondary | ICD-10-CM | POA: Diagnosis not present

## 2021-04-13 DIAGNOSIS — I10 Essential (primary) hypertension: Secondary | ICD-10-CM | POA: Diagnosis not present

## 2021-04-13 DIAGNOSIS — Z79899 Other long term (current) drug therapy: Secondary | ICD-10-CM | POA: Diagnosis not present

## 2021-04-13 DIAGNOSIS — R1013 Epigastric pain: Secondary | ICD-10-CM | POA: Diagnosis not present

## 2021-04-13 DIAGNOSIS — R0789 Other chest pain: Secondary | ICD-10-CM | POA: Insufficient documentation

## 2021-04-13 DIAGNOSIS — K219 Gastro-esophageal reflux disease without esophagitis: Secondary | ICD-10-CM | POA: Insufficient documentation

## 2021-04-13 LAB — CBC WITH DIFFERENTIAL/PLATELET
Abs Immature Granulocytes: 0.01 10*3/uL (ref 0.00–0.07)
Basophils Absolute: 0 10*3/uL (ref 0.0–0.1)
Basophils Relative: 0 %
Eosinophils Absolute: 0.1 10*3/uL (ref 0.0–0.5)
Eosinophils Relative: 1 %
HCT: 36 % (ref 36.0–46.0)
Hemoglobin: 11.8 g/dL — ABNORMAL LOW (ref 12.0–15.0)
Immature Granulocytes: 0 %
Lymphocytes Relative: 51 %
Lymphs Abs: 2.2 10*3/uL (ref 0.7–4.0)
MCH: 29.5 pg (ref 26.0–34.0)
MCHC: 32.8 g/dL (ref 30.0–36.0)
MCV: 90 fL (ref 80.0–100.0)
Monocytes Absolute: 0.3 10*3/uL (ref 0.1–1.0)
Monocytes Relative: 6 %
Neutro Abs: 1.9 10*3/uL (ref 1.7–7.7)
Neutrophils Relative %: 42 %
Platelets: 196 10*3/uL (ref 150–400)
RBC: 4 MIL/uL (ref 3.87–5.11)
RDW: 12.8 % (ref 11.5–15.5)
WBC: 4.5 10*3/uL (ref 4.0–10.5)
nRBC: 0 % (ref 0.0–0.2)

## 2021-04-13 LAB — COMPREHENSIVE METABOLIC PANEL
ALT: 24 U/L (ref 0–44)
AST: 23 U/L (ref 15–41)
Albumin: 4.3 g/dL (ref 3.5–5.0)
Alkaline Phosphatase: 47 U/L (ref 38–126)
Anion gap: 9 (ref 5–15)
BUN: 13 mg/dL (ref 6–20)
CO2: 27 mmol/L (ref 22–32)
Calcium: 9.5 mg/dL (ref 8.9–10.3)
Chloride: 101 mmol/L (ref 98–111)
Creatinine, Ser: 0.75 mg/dL (ref 0.44–1.00)
GFR, Estimated: >60 mL/min (ref >60)
Glucose, Bld: 133 mg/dL — ABNORMAL HIGH (ref 70–99)
Potassium: 3.6 mmol/L (ref 3.5–5.1)
Sodium: 137 mmol/L (ref 135–145)
Total Bilirubin: 0.4 mg/dL (ref 0.3–1.2)
Total Protein: 7.6 g/dL (ref 6.5–8.1)

## 2021-04-13 LAB — TROPONIN I (HIGH SENSITIVITY)
Troponin I (High Sensitivity): <2 ng/L (ref <18)
Troponin I (High Sensitivity): 2 ng/L (ref <18)

## 2021-04-13 LAB — D-DIMER, QUANTITATIVE: D-Dimer, Quant: <0.27 ug/mL-FEU (ref 0.00–0.50)

## 2021-04-13 LAB — LIPASE, BLOOD: Lipase: 34 U/L (ref 11–51)

## 2021-04-13 MED ORDER — ALUM & MAG HYDROXIDE-SIMETH 200-200-20 MG/5ML PO SUSP
30.0000 mL | Freq: Once | ORAL | Status: AC
  Administered 2021-04-13: 16:00:00 30 mL via ORAL
  Filled 2021-04-13: qty 30

## 2021-04-13 MED ORDER — HYDRALAZINE HCL 20 MG/ML IJ SOLN
10.0000 mg | Freq: Once | INTRAMUSCULAR | Status: AC
  Administered 2021-04-13: 16:00:00 10 mg via INTRAVENOUS
  Filled 2021-04-13: qty 1

## 2021-04-13 MED ORDER — LIDOCAINE VISCOUS HCL 2 % MT SOLN
15.0000 mL | Freq: Once | OROMUCOSAL | Status: AC
  Administered 2021-04-13: 16:00:00 15 mL via ORAL
  Filled 2021-04-13: qty 15

## 2021-04-13 MED ORDER — CYCLOBENZAPRINE HCL 10 MG PO TABS: 10.0000 mg | ORAL_TABLET | Freq: Two times a day (BID) | ORAL | 0 refills | Status: DC | PRN

## 2021-04-13 NOTE — ED Notes (Signed)
Patient transported to XR. 

## 2021-04-13 NOTE — Discharge Instructions (Addendum)
Your history, exam, work-up today are overall reassuring and we suspect your symptoms are related to your muscles in the back, shoulder, and chest wall.  Your cardiac work-up was overall reassuring with normal cardiac enzymes both times we checked them.  The D-dimer was negative to suggest there is no blood clot.  Your chest x-ray was also reassuring.  We feel you are safe for discharge home given the improvement in your symptoms and please continue your home medications for blood pressure management and other medications.  Please use the muscle relaxant to help with the spasms and pain and please continue taking your medications for reflux.  Please avoid spicy foods as there is likely component of that causing your discomfort.  Please follow-up with your primary doctor and if any symptoms change or worsen, please return to the nearest emergency department.

## 2021-04-13 NOTE — ED Provider Notes (Signed)
[facility] EMERGENCY DEPARTMENT Provider Note   CSN: [id number] Arrival date & time: 04/13/21  1445     History Chief Complaint  Patient presents with   Arm Pain    Natasha Hickman is a 55 y.o. female.   Arm Pain This is a recurrent problem. The current episode started more than 2 days ago. The problem occurs rarely. The problem has not changed since onset.Associated symptoms include chest pain. Pertinent negatives include no abdominal pain, no headaches and no shortness of breath. Exacerbated by: certain movment of arm. Nothing relieves the symptoms. She has tried nothing for the symptoms. The treatment provided no relief.      Past Medical History:  Diagnosis Date   Acid reflux    Anxiety    Barrett's esophagus    DDD (degenerative disc disease), cervical    Fatigue    GERD (gastroesophageal reflux disease)    Hiatal hernia    High risk medication use    Hypertension    IBS (irritable bowel syndrome)    No longer have   Migraines    Mixed hyperlipidemia    Renal disorder     Patient Active Problem List   Diagnosis Date Noted   Chronic right-sided low back pain with right-sided sciatica 11/21/2018   Hand numbness 11/21/2018   Lumbar radiculopathy 11/21/2018   Ingrown nail 08/08/2014   Pain in lower limb 08/08/2014   Abdominal pain 06/28/2013   HEADACHE 01/26/2009   GERD 01/09/2009   ALLERGIC RHINITIS 01/06/2009   DIARRHEA 01/06/2009   UNSPECIFIED DISORDER OF ESOPHAGUS 12/15/2008   ANXIETY STATE, UNSPECIFIED 11/18/2008   TOBACCO ABUSE 11/18/2008   HYPERTENSION 11/18/2008   DYSPNEA 11/18/2008    Past Surgical History:  Procedure Laterality Date   ABDOMINAL HYSTERECTOMY     ESOPHAGOGASTRIC FUNDOPLASTY     GASTRECTOMY     SHOULDER SURGERY Right    arthroscopic      OB History   No obstetric history on file.     Family History  Problem Relation Age of Onset   CAD Mother    Hypertension Mother    Diabetes Mother    CAD Father     Hypertension Father    Hypertension Sister    Diabetes Brother     Social History   Tobacco Use   Smoking status: Former    Packs/day: 0.75    Pack years: 0.00    Types: Cigarettes   Smokeless tobacco: Never  Vaping Use   Vaping Use: Never used  Substance Use Topics   Alcohol use: Yes    Alcohol/week: 0.0 standard drinks    Comment: sometimes   Drug use: No    Home Medications Prior to Admission medications   Medication Sig Start Date End Date Taking? Authorizing Provider  amLODipine-atorvastatin (CADUET) 5-20 MG tablet Take by mouth. 11/06/20  Yes [provider]  atenolol (TENORMIN) 25 MG tablet Take 25 mg by mouth daily. 10/14/20  Yes [provider]  clonazePAM (KLONOPIN) 0.5 MG tablet Take 0.5 mg by mouth 2 (two) times daily as needed.   Yes [provider]  fish oil-omega-3 fatty acids 1000 MG capsule Take 1 g by mouth 3 (three) times daily.   Yes [provider]  Multiple Vitamins-Minerals (MULTIVITAMIN WITH MINERALS) tablet Take 1 tablet by mouth daily.     Yes [provider]  pantoprazole (PROTONIX) 40 MG tablet Take 40 mg by mouth 2 (two) times daily. 10/05/20  Yes [provider]  Vitamin D, Ergocalciferol, (DRISDOL) 50000 units CAPS capsule Take 50,000 Units by mouth every 7 (seven) days.   Yes [provider]  ALPRAZolam (XANAX) 0.25 MG tablet Take 0.125 mg by mouth once as needed. For anxiety    [provider]  amLODipine-olmesartan (AZOR) 5-20 MG tablet Take 1 tablet by mouth daily.      [provider]  Ascorbic Acid (VITAMIN C PO) Take by mouth.    [provider]  BLACK CURRANT SEED OIL PO Take by mouth.    [provider]  cyclobenzaprine (FLEXERIL) 10 MG tablet Take 1 tablet (10 mg total) by mouth 2 (two) times daily as needed for muscle spasms. 11/06/20   Renne Crigler, PA-C  DOCOSAHEXAENOIC ACID PO Take 1 g by mouth.    [provider]  esomeprazole  (NEXIUM) 40 MG capsule Take 40 mg by mouth daily before breakfast.    [provider]  ferrous sulfate 325 (65 FE) MG tablet Take 325 mg by mouth 2 (two) times daily with a meal.    [provider]  Flaxseed, Linseed, (FLAXSEED OIL) 1000 MG CAPS Take 1 capsule by mouth 2 (two) times daily.    [provider]  fluticasone (FLONASE) 50 MCG/ACT nasal spray 1-2 SPRAY, NASAL, IN EACH NOSTRIL DAILY 04/17/15   [provider]  gabapentin (NEURONTIN) 100 MG capsule Take 100 mg by mouth 3 (three) times daily.    [provider]  Garlic 1000 MG CAPS Take 1 capsule by mouth daily.    [provider]  meloxicam (MOBIC) 7.5 MG tablet Take 1 tablet (7.5 mg total) by mouth daily. 11/06/20   Renne Crigler, PA-C  neomycin-polymyxin-gramicidin (NEOSPORIN) 1.75-10000-.025 ophthalmic solution Place 1-2 drops into the right eye 4 (four) times daily.    [provider]  RABEprazole (ACIPHEX) 20 MG tablet Take by mouth. 09/26/16   [provider]  VITAMIN D PO Take by mouth.    [provider]  dicyclomine (BENTYL) 20 MG tablet Take 1 tablet (20 mg total) by mouth 2 (two) times daily. 09/23/12 11/06/20  Elson Areas, PA-C  famotidine (PEPCID) 20 MG tablet Take 1 tablet (20 mg total) by mouth 2 (two) times daily as needed for heartburn (abdominal pain). 12/08/11 12/21/11  Felisa Bonier, MD  omeprazole (PRILOSEC) 40 MG capsule Take 40 mg by mouth daily.    12/21/11  [provider]    Allergies    Adhesive [tape], Meperidine hcl, Percocet [oxycodone-acetaminophen], and Septra [bactrim]  Review of Systems   Review of Systems  Constitutional:  Negative for chills, diaphoresis, fatigue and fever.  HENT:  Negative for congestion.   Eyes:  Negative for visual disturbance.  Respiratory:  Negative for cough, chest tightness, shortness of breath and wheezing.   Cardiovascular:  Positive for chest pain. Negative for palpitations and leg  swelling.  Gastrointestinal:  Negative for abdominal pain, constipation, diarrhea, nausea and vomiting.  Genitourinary:  Negative for dysuria and flank pain.  Musculoskeletal:  Negative for neck pain and neck stiffness.  Skin:  Negative for rash and wound.  Neurological:  Negative for weakness, light-headedness, numbness and headaches.  Psychiatric/Behavioral:  Negative for agitation and confusion.   All other systems reviewed and are negative.  Physical Exam Updated Vital Signs BP (!) 186/109 (BP Location: Left Arm)   Pulse 77   Temp 98.4 F (36.9 C) (Oral)   Resp 18   Ht  (1.549 m)   Wt 59.9 kg  SpO2 100%   BMI 24.94 kg/m   Physical Exam Vitals and nursing note reviewed.  Constitutional:      General: She is not in acute distress.    Appearance: She is well-developed. She is not ill-appearing, toxic-appearing or diaphoretic.  HENT:     Head: Normocephalic and atraumatic.     Nose: No congestion.  Eyes:     Conjunctiva/sclera: Conjunctivae normal.  Cardiovascular:     Rate and Rhythm: Normal rate and regular rhythm.     Pulses: Normal pulses.     Heart sounds: No murmur heard. Pulmonary:     Effort: Pulmonary effort is normal. No respiratory distress.     Breath sounds: Normal breath sounds. No wheezing, rhonchi or rales.  Chest:     Chest wall: Tenderness present.  Abdominal:     General: Abdomen is flat. There is no distension.     Palpations: Abdomen is soft.     Tenderness: There is no abdominal tenderness. There is no right CVA tenderness, left CVA tenderness, guarding or rebound.  Musculoskeletal:        General: Tenderness present.     Cervical back: Neck supple. No tenderness.     Right lower leg: No edema.     Left lower leg: No edema.  Skin:    General: Skin is warm and dry.     Capillary Refill: Capillary refill takes less than 2 seconds.     Findings: No erythema.  Neurological:     Mental Status: She is alert.     Sensory: No sensory  deficit.     Motor: No weakness.  Psychiatric:        Mood and Affect: Mood normal.    ED Results / Procedures / Treatments   Labs (all labs ordered are listed, but only abnormal results are displayed) Labs Reviewed  CBC WITH DIFFERENTIAL/PLATELET - Abnormal; Notable for the following components:      Result Value   Hemoglobin 11.8 (*)    All other components within normal limits  COMPREHENSIVE METABOLIC PANEL - Abnormal; Notable for the following components:   Glucose, Bld 133 (*)    All other components within normal limits  LIPASE, BLOOD  D-DIMER, QUANTITATIVE  TROPONIN I (HIGH SENSITIVITY)  TROPONIN I (HIGH SENSITIVITY)    EKG EKG Interpretation  Date/Time:  Tuesday April 13 2021 14:58:16 EDT Ventricular Rate:  71 PR Interval:  146 QRS Duration: 78 QT Interval:  396 QTC Calculation: 430 R Axis:   59 Text Interpretation: Normal sinus rhythm Septal infarct , age undetermined Abnormal ECG when compared to prior, similar appearance overall. NO STEMI Confirmed by Theda Belfast ([id number]) on 04/13/2021 3:15:05 PM  Radiology DG Chest 2 View  Result Date: 04/13/2021 CLINICAL DATA:  Chest pain EXAM: CHEST - 2 VIEW COMPARISON:  November 06, 2020 FINDINGS: Lungs are clear. Heart size and pulmonary vascularity are normal. No adenopathy. There is aortic atherosclerosis. No bone lesions. IMPRESSION: Lungs clear. Cardiac silhouette within normal limits. Aortic Atherosclerosis (ICD10-I70.0). Electronically Signed   By: Bretta Bang III M.D.   On: 04/13/2021 15:49    Procedures Procedures   Medications Ordered in ED Medications  alum & mag hydroxide-simeth (MAALOX/MYLANTA) 200-200-20 MG/5ML suspension 30 mL (30 mLs Oral Given 04/13/21 1540)    And  lidocaine (XYLOCAINE) 2 % viscous mouth solution 15 mL (15 mLs Oral Given 04/13/21 1540)  hydrALAZINE (APRESOLINE) injection 10 mg (10 mg Intravenous Given 04/13/21 1541)    ED Course  I have  reviewed the triage vital signs and the  nursing notes.  Pertinent labs & imaging results that were available during my care of the patient were reviewed by me and considered in my medical decision making (see chart for details).    MDM Rules/Calculators/A&P                          FRIMET DURFEE is a 55 y.o. female with a past medical history significant for hypertension, hyperlipidemia, GERD with reflux, Barrett's esophagus, and anxiety who presents with left arm pain.  Patient reports that for the last few days, she has had recurrent left arm discomfort that feels like a burning and aching pain.  She reports it does start in her left chest and then goes to her left shoulder and down her left arm.  She reports feel similar to how she fell back in January when she had a reassuring work-up.  She reports is also having some epigastric burning related to reflux she suspects.  She denies any fevers, chills, cough, nausea, vomiting, diaphoresis, constipation, diarrhea, or urinary symptoms.  Denies a history of DVT or PE and denies any leg pain or leg swelling.  Denies the pain radiating straight through to her back and reports it goes to towards her left shoulder blade around the side.  No rashes or skin changes reported.  No trauma.  She reports it is worsened with different positioning of her arm.  She reports it is not exertional and does not specifically pleuritic.  Blood pressure is somewhat elevated today.  EKG shows no STEMI.  Heart score calculated as a 3.  On exam, left arm is tender to palpation as is left lateral chest.  No murmur.  Lungs clear.  Abdomen nontender.  Good pulses in extremities.  Patient otherwise resting comfortably.  Suspect musculoskeletal discomfort and GERD burning discomfort however we will start work-up to rule out more concerning etiologies of her symptoms.  We will get troponin, D-dimer, chest x-ray, and screening labs.  We will give the patient GI cocktail given the known GERD and help give her some help with  her blood pressure which is more elevated today which I suspect is related to some anxiety and whitecoat hypertension here in the emergency department.  As the pain is not sharp and does not go straight to the back, have lower suspicion for an aortic etiology of symptoms.  Anticipate reassessment after work-up to determine disposition.  6:06 PM Delta troponin is negative.  D-dimer is negative.  Other labs overall reassuring.  Chest x-ray shows no pneumonia, pneumothorax, or other concerning finding.  Patient remained stable on telemetry and no other concerning findings seen.  Patient was feeling better after GI cocktail.  Blood pressure is also improved.  Patient will follow-up with primary doctor and I clinically suspect this is more musculoskeletal and patient agrees.  Patient did have spasm in her back and in the arm on exam.  Patient will be given prescription for Flexeril as that seemed to help last time and she will follow-up with her PCP.  She agreed to plan of care and had no other questions or concerns and patient was discharged in good condition with significant improvement in presenting symptoms.   Final Clinical Impression(s) / ED Diagnoses Final diagnoses:  Left arm pain  Muscle spasm  Atypical chest pain    Rx / DC Orders ED Discharge Orders  Ordered    cyclobenzaprine (FLEXERIL) 10 MG tablet  2 times daily PRN        04/13/21 1808           Clinical Impression: 1. Left arm pain   2. Muscle spasm   3. Atypical chest pain     Disposition: Discharge  Condition: Good  I have discussed the results, Dx and Tx plan with the pt(& family if present). He/she/they expressed understanding and agree(s) with the plan. Discharge instructions discussed at great length. Strict return precautions discussed and pt &/or family have verbalized understanding of the instructions. No further questions at time of discharge.    New Prescriptions   CYCLOBENZAPRINE (FLEXERIL)  10 MG TABLET    Take 1 tablet (10 mg total) by mouth 2 (two) times daily as needed for muscle spasms.    Follow Up: Ronnald Collumuran, Michael R, PA-C [facility]  [address] [place] [id number] [phone]     [facility] [address] [id number] [address] [place] [id number] [phone]        Iyad Deroo, Canary Brimhristopher J, MD 04/13/21 1810

## 2021-04-13 NOTE — ED Triage Notes (Signed)
Left arm pain x 2 days. Denies injury. States the pain comes and goes.

## 2021-04-13 NOTE — ED Notes (Signed)
ED Provider at bedside. 

## 2021-08-29 ENCOUNTER — Emergency Department (HOSPITAL_BASED_OUTPATIENT_CLINIC_OR_DEPARTMENT_OTHER): Payer: BC Managed Care – PPO

## 2021-08-29 ENCOUNTER — Emergency Department (HOSPITAL_BASED_OUTPATIENT_CLINIC_OR_DEPARTMENT_OTHER)
Admission: EM | Admit: 2021-08-29 | Discharge: 2021-08-29 | Disposition: A | Discharge status: Home / Self Care | Payer: BC Managed Care – PPO | Attending: Emergency Medicine | Admitting: Emergency Medicine

## 2021-08-29 ENCOUNTER — Other Ambulatory Visit: Payer: Self-pay

## 2021-08-29 ENCOUNTER — Encounter (HOSPITAL_BASED_OUTPATIENT_CLINIC_OR_DEPARTMENT_OTHER): Payer: Self-pay | Admitting: Emergency Medicine

## 2021-08-29 DIAGNOSIS — M545 Low back pain, unspecified: Secondary | ICD-10-CM | POA: Insufficient documentation

## 2021-08-29 DIAGNOSIS — R1010 Upper abdominal pain, unspecified: Secondary | ICD-10-CM | POA: Diagnosis not present

## 2021-08-29 DIAGNOSIS — Z79899 Other long term (current) drug therapy: Secondary | ICD-10-CM | POA: Insufficient documentation

## 2021-08-29 DIAGNOSIS — Z87891 Personal history of nicotine dependence: Secondary | ICD-10-CM | POA: Diagnosis not present

## 2021-08-29 DIAGNOSIS — R1013 Epigastric pain: Secondary | ICD-10-CM | POA: Diagnosis present

## 2021-08-29 DIAGNOSIS — I10 Essential (primary) hypertension: Secondary | ICD-10-CM | POA: Diagnosis not present

## 2021-08-29 LAB — CBC WITH DIFFERENTIAL/PLATELET
Abs Immature Granulocytes: 0.02 10*3/uL (ref 0.00–0.07)
Basophils Absolute: 0 10*3/uL (ref 0.0–0.1)
Basophils Relative: 1 %
Eosinophils Absolute: 0.1 10*3/uL (ref 0.0–0.5)
Eosinophils Relative: 2 %
HCT: 33.7 % — ABNORMAL LOW (ref 36.0–46.0)
Hemoglobin: 10.5 g/dL — ABNORMAL LOW (ref 12.0–15.0)
Immature Granulocytes: 1 %
Lymphocytes Relative: 44 %
Lymphs Abs: 1.7 10*3/uL (ref 0.7–4.0)
MCH: 27.1 pg (ref 26.0–34.0)
MCHC: 31.2 g/dL (ref 30.0–36.0)
MCV: 87.1 fL (ref 80.0–100.0)
Monocytes Absolute: 0.3 10*3/uL (ref 0.1–1.0)
Monocytes Relative: 8 %
Neutro Abs: 1.6 10*3/uL — ABNORMAL LOW (ref 1.7–7.7)
Neutrophils Relative %: 44 %
Platelets: 249 10*3/uL (ref 150–400)
RBC: 3.87 MIL/uL (ref 3.87–5.11)
RDW: 14.1 % (ref 11.5–15.5)
WBC: 3.7 10*3/uL — ABNORMAL LOW (ref 4.0–10.5)
nRBC: 0 % (ref 0.0–0.2)

## 2021-08-29 LAB — COMPREHENSIVE METABOLIC PANEL
ALT: 26 U/L (ref 0–44)
AST: 24 U/L (ref 15–41)
Albumin: 4 g/dL (ref 3.5–5.0)
Alkaline Phosphatase: 57 U/L (ref 38–126)
Anion gap: 8 (ref 5–15)
BUN: 17 mg/dL (ref 6–20)
CO2: 26 mmol/L (ref 22–32)
Calcium: 9.2 mg/dL (ref 8.9–10.3)
Chloride: 104 mmol/L (ref 98–111)
Creatinine, Ser: 0.7 mg/dL (ref 0.44–1.00)
GFR, Estimated: >60 mL/min (ref >60)
Glucose, Bld: 118 mg/dL — ABNORMAL HIGH (ref 70–99)
Potassium: 4 mmol/L (ref 3.5–5.1)
Sodium: 138 mmol/L (ref 135–145)
Total Bilirubin: 0.4 mg/dL (ref 0.3–1.2)
Total Protein: 7.6 g/dL (ref 6.5–8.1)

## 2021-08-29 LAB — URINALYSIS, ROUTINE W REFLEX MICROSCOPIC
Bilirubin Urine: NEGATIVE
Glucose, UA: NEGATIVE mg/dL
Ketones, ur: NEGATIVE mg/dL
Leukocytes,Ua: NEGATIVE
Nitrite: NEGATIVE
Protein, ur: NEGATIVE mg/dL
Specific Gravity, Urine: 1.025 (ref 1.005–1.030)
pH: 6 (ref 5.0–8.0)

## 2021-08-29 LAB — URINALYSIS, MICROSCOPIC (REFLEX)

## 2021-08-29 LAB — LIPASE, BLOOD: Lipase: 46 U/L (ref 11–51)

## 2021-08-29 MED ORDER — FAMOTIDINE 20 MG PO TABS: 20.0000 mg | ORAL_TABLET | Freq: Two times a day (BID) | ORAL | 0 refills | Status: DC

## 2021-08-29 MED ORDER — SUCRALFATE 1 G PO TABS: 1.0000 g | ORAL_TABLET | Freq: Three times a day (TID) | ORAL | 0 refills | Status: DC

## 2021-08-29 MED ORDER — METHOCARBAMOL 500 MG PO TABS: 500.0000 mg | ORAL_TABLET | Freq: Four times a day (QID) | ORAL | 0 refills | Status: DC | PRN

## 2021-08-29 NOTE — ED Notes (Signed)
US at bedside

## 2021-08-29 NOTE — ED Provider Notes (Signed)
[facility] EMERGENCY DEPARTMENT Provider Note   CSN: [id number] Arrival date & time: 08/29/21  9629     History Chief Complaint  Patient presents with   Abdominal Pain    Natasha Hickman is a 55 y.o. female.  HPI Gradual onset of symptoms.  For 2 months patient's been getting intermittent epigastric and upper abdominal pain.  Cramping and spasmodic in quality.  Comes and goes but present most days.  No vomiting.  Not associated with any particular food.  Nothing seems to improve symptoms.  Patient reports history of Nissen fundoplication over 30 years ago.  She reports she has severe problems with reflux and dysphagia at that time.  She takes Protonix but not daily.  She estimates she takes it several times a week.  She denies constipation pain with bowel movements or blood in stool.  Patient reports she also gets a lot of back pain.  It is not always associated with abdominal pain.  She reports the back pain is low back pain, she indicates her lower lumbar spine.  However she reports it can get spasmodic and radiates up toward the thoracic spine.  Equivocal whether its associated with the abdominal pain at times.  No pain burning or urgency with urination.  Patient does have a gastroenterologist.  She reports last upper endoscopy was about a year ago.  She does have a hiatal hernia.  She reports she does drink about 2 beers per day.    Past Medical History:  Diagnosis Date   Acid reflux    Anxiety    Barrett's esophagus    DDD (degenerative disc disease), cervical    Fatigue    GERD (gastroesophageal reflux disease)    Hiatal hernia    High risk medication use    Hypertension    IBS (irritable bowel syndrome)    No longer have   Migraines    Mixed hyperlipidemia    Renal disorder     Patient Active Problem List   Diagnosis Date Noted   Chronic right-sided low back pain with right-sided sciatica 11/21/2018   Hand numbness 11/21/2018   Lumbar radiculopathy  11/21/2018   Ingrown nail 08/08/2014   Pain in lower limb 08/08/2014   Abdominal pain 06/28/2013   HEADACHE 01/26/2009   GERD 01/09/2009   ALLERGIC RHINITIS 01/06/2009   DIARRHEA 01/06/2009   UNSPECIFIED DISORDER OF ESOPHAGUS 12/15/2008   ANXIETY STATE, UNSPECIFIED 11/18/2008   TOBACCO ABUSE 11/18/2008   HYPERTENSION 11/18/2008   DYSPNEA 11/18/2008    Past Surgical History:  Procedure Laterality Date   ABDOMINAL HYSTERECTOMY     ESOPHAGOGASTRIC FUNDOPLASTY     GASTRECTOMY     HIP SURGERY Right    SHOULDER SURGERY Right    arthroscopic      OB History   No obstetric history on file.     Family History  Problem Relation Age of Onset   CAD Mother    Hypertension Mother    Diabetes Mother    CAD Father    Hypertension Father    Hypertension Sister    Diabetes Brother     Social History   Tobacco Use   Smoking status: Former    Packs/day: 0.75    Types: Cigarettes   Smokeless tobacco: Never  Vaping Use   Vaping Use: Never used  Substance Use Topics   Alcohol use: Yes    Alcohol/week: 0.0 standard drinks    Comment: sometimes   Drug use: No  Home Medications Prior to Admission medications   Medication Sig Start Date End Date Taking? Authorizing Provider  amLODipine-atorvastatin (CADUET) 5-20 MG tablet Take by mouth. 11/06/20  Yes [provider]  Ascorbic Acid (VITAMIN C PO) Take by mouth.   Yes [provider]  dicyclomine (BENTYL) 10 MG capsule Take 10 mg by mouth 4 (four) times daily -  before meals and at bedtime.   Yes [provider]  famotidine (PEPCID) 20 MG tablet Take 1 tablet (20 mg total) by mouth 2 (two) times daily. 08/29/21  Yes Arby Barrette, MD  famotidine (PEPCID) 40 MG tablet Take 40 mg by mouth daily.   Yes [provider]  fish oil-omega-3 fatty acids 1000 MG capsule Take 1 g by mouth 3 (three) times daily.   Yes [provider]  methocarbamol (ROBAXIN) 500 MG tablet Take 1 tablet (500 mg  total) by mouth every 6 (six) hours as needed for muscle spasms. 08/29/21  Yes Arby Barrette, MD  pantoprazole (PROTONIX) 40 MG tablet Take 40 mg by mouth 2 (two) times daily. 10/05/20  Yes [provider]  sucralfate (CARAFATE) 1 g tablet Take 1 tablet (1 g total) by mouth 4 (four) times daily -  with meals and at bedtime. 08/29/21  Yes Arby Barrette, MD  Vitamin D, Ergocalciferol, (DRISDOL) 50000 units CAPS capsule Take 50,000 Units by mouth every 7 (seven) days.   Yes [provider]  ALPRAZolam (XANAX) 0.25 MG tablet Take 0.125 mg by mouth once as needed. For anxiety    [provider]  amLODipine-olmesartan (AZOR) 5-20 MG tablet Take 1 tablet by mouth daily.      [provider]  atenolol (TENORMIN) 25 MG tablet Take 25 mg by mouth daily. 10/14/20   [provider]  BLACK CURRANT SEED OIL PO Take by mouth.    [provider]  clonazePAM (KLONOPIN) 0.5 MG tablet Take 0.5 mg by mouth 2 (two) times daily as needed.    [provider]  cyclobenzaprine (FLEXERIL) 10 MG tablet Take 1 tablet (10 mg total) by mouth 2 (two) times daily as needed for muscle spasms. 11/06/20   Renne Crigler, PA-C  cyclobenzaprine (FLEXERIL) 10 MG tablet Take 1 tablet (10 mg total) by mouth 2 (two) times daily as needed for muscle spasms. 04/13/21   Tegeler, Canary Brim, MD  DOCOSAHEXAENOIC ACID PO Take 1 g by mouth.    [provider]  esomeprazole (NEXIUM) 40 MG capsule Take 40 mg by mouth daily before breakfast.    [provider]  ferrous sulfate 325 (65 FE) MG tablet Take 325 mg by mouth 2 (two) times daily with a meal.    [provider]  Flaxseed, Linseed, (FLAXSEED OIL) 1000 MG CAPS Take 1 capsule by mouth 2 (two) times daily.    [provider]  fluticasone (FLONASE) 50 MCG/ACT nasal spray 1-2 SPRAY, NASAL, IN EACH NOSTRIL DAILY 04/17/15   [provider]  gabapentin (NEURONTIN) 100 MG capsule Take 100 mg  by mouth 3 (three) times daily.    [provider]  Garlic 1000 MG CAPS Take 1 capsule by mouth daily.    [provider]  meloxicam (MOBIC) 7.5 MG tablet Take 1 tablet (7.5 mg total) by mouth daily. 11/06/20   Renne Crigler, PA-C  Multiple Vitamins-Minerals (MULTIVITAMIN WITH MINERALS) tablet Take 1 tablet by mouth daily.      [provider]  neomycin-polymyxin-gramicidin (NEOSPORIN) 1.75-10000-.025 ophthalmic solution Place 1-2 drops into the right  eye 4 (four) times daily.    [provider]  RABEprazole (ACIPHEX) 20 MG tablet Take by mouth. 09/26/16   [provider]  VITAMIN D PO Take by mouth.    [provider]  omeprazole (PRILOSEC) 40 MG capsule Take 40 mg by mouth daily.    12/21/11  [provider]    Allergies    Adhesive [tape], Demerol [meperidine hcl], Meperidine hcl, Percocet [oxycodone-acetaminophen], and Septra [bactrim]  Review of Systems   Review of Systems 10 systems reviewed and negative except as per HPI Physical Exam Updated Vital Signs BP (!) 150/101   Pulse 86   Temp 98.4 F (36.9 C)   Resp 16   SpO2 100%   Physical Exam Constitutional:      Comments: Alert nontoxic clinically well in appearance.  HENT:     Head: Normocephalic and atraumatic.     Mouth/Throat:     Pharynx: Oropharynx is clear.  Eyes:     Extraocular Movements: Extraocular movements intact.  Cardiovascular:     Rate and Rhythm: Normal rate and regular rhythm.  Pulmonary:     Effort: Pulmonary effort is normal.     Breath sounds: Normal breath sounds.  Abdominal:     General: There is no distension.     Palpations: Abdomen is soft.     Tenderness: There is no abdominal tenderness. There is no guarding.     Comments: Patient has a long well-healed midline surgical scar.  Abdomen is soft.  There is no reproducible pain to palpation.  No palpable masses.  Musculoskeletal:        General: No swelling or tenderness. Normal  range of motion.     Right lower leg: No edema.     Left lower leg: No edema.  Skin:    General: Skin is warm and dry.  Neurological:     General: No focal deficit present.     Mental Status: She is oriented to person, place, and time.     Coordination: Coordination normal.  Psychiatric:        Mood and Affect: Mood normal.    ED Results / Procedures / Treatments   Labs (all labs ordered are listed, but only abnormal results are displayed) Labs Reviewed  COMPREHENSIVE METABOLIC PANEL - Abnormal; Notable for the following components:      Result Value   Glucose, Bld 118 (*)    All other components within normal limits  CBC WITH DIFFERENTIAL/PLATELET - Abnormal; Notable for the following components:   WBC 3.7 (*)    Hemoglobin 10.5 (*)    HCT 33.7 (*)    Neutro Abs 1.6 (*)    All other components within normal limits  URINALYSIS, ROUTINE W REFLEX MICROSCOPIC - Abnormal; Notable for the following components:   Hgb urine dipstick TRACE (*)    All other components within normal limits  URINALYSIS, MICROSCOPIC (REFLEX) - Abnormal; Notable for the following components:   Bacteria, UA MANY (*)    All other components within normal limits  LIPASE, BLOOD    EKG None  Radiology US Abdomen Limited RUQ (LIVER/GB)  Result Date: 08/29/2021 CLINICAL DATA:  Epigastric pain for a few months. EXAM: ULTRASOUND ABDOMEN LIMITED RIGHT UPPER QUADRANT COMPARISON:  December 08, 2011 FINDINGS: Gallbladder: No gallstones or wall thickening visualized. No sonographic Murphy sign noted by sonographer. Common bile duct: Diameter: 5 mm Liver: No focal lesion identified. Within normal limits in parenchymal echogenicity. Portal vein is patent on color Doppler  imaging with normal direction of blood flow towards the liver. Other: None. IMPRESSION: Normal right upper quadrant ultrasound. Electronically Signed   By: Sherian Rein M.D.   On: 08/29/2021 09:26    Procedures Procedures   Medications Ordered in  ED Medications - No data to display  ED Course  I have reviewed the triage vital signs and the nursing notes.  Pertinent labs & imaging results that were available during my care of the patient were reviewed by me and considered in my medical decision making (see chart for details).    MDM Rules/Calculators/A&P                           Patient presents with 2 problems that have been fairly longstanding.  Patient's abdominal examination is nontender to deep palpation.  No palpable masses.  Pain has been present for about 2 months and has some waxing and waning episodic quality to it predominantly in the epigastrium.  Patient has significant history of esophageal dysfunction with history of Nissen fundoplication and IBS.  At this time my recommendation is to go back to daily Protonix as originally prescribed.  We will augment therapy for suspected gastritis and reflux with Pepcid for 2 weeks and Carafate for a week.  She will be following up with her gastroenterologist soon to assess for response to treatment and alternative diagnostic evaluation if indicated. Patient reports significant problems with low back pain.  She is seen by a spine specialist.  At this time by my exam and history this does not seem to be related to the abdominal pain.  Plan will be to treat with Robaxin and acetaminophen for pain control with some recommended exercises.  My recommendation is recheck after completing treatment for GI symptoms to assess if symptoms are persisting and further evaluation such as MRI will be indicated.  At this time no emergent neurologic findings to suggest need for urgent emergent MRI.  Final Clinical Impression(s) / ED Diagnoses Final diagnoses:  Epigastric pain  Bilateral low back pain without sciatica, unspecified chronicity    Rx / DC Orders ED Discharge Orders          Ordered    methocarbamol (ROBAXIN) 500 MG tablet  Every 6 hours PRN        08/29/21 1159    sucralfate (CARAFATE)  1 g tablet  3 times daily with meals & bedtime        08/29/21 1159    famotidine (PEPCID) 20 MG tablet  2 times daily        08/29/21 1159             Arby Barrette, MD 08/29/21 1206

## 2021-08-29 NOTE — ED Triage Notes (Signed)
Pt reports she has been having abd pain radiating to her back for the past few months. Pt reports pain starts in epigastric area and radiates down from there. Cannot think of any triggers. Pt reports abd pain is intermittent, and back pain is constant. No n/v/d.

## 2021-08-29 NOTE — Discharge Instructions (Addendum)
1.  Review instructions for gastritis.  At this time the plan will be to maximally treat for inflammation or irritation of the stomach including due to reflux, ulcers and general gastritis.  Take your Protonix as prescribed daily.  Take Pepcid twice daily for the next 2 weeks.  Take Carafate as prescribed for the next 5 to 7 days.  If you are seeing improvement in symptoms in approximately a week, your symptoms are likely due to stomach related issues as outlined.  As you improve, you may decrease your use of Carafate and Pepcid back to daily Protonix as originally prescribed.  You will be seeing your gastroenterologist on Thursday.  Review this plan with your gastroenterologist and the symptoms you have been experiencing. 2.  You have low back pain.  It does not appear to be causing any impingement or pressure on the nerves causing problems with walking or normal function.  During this current evaluation, this seems less likely to be related to your abdominal pain.  This will need further evaluation.  Been prescribed Robaxin as a muscle relaxer to take to see if this helps.  Also take acetaminophen extra strength every 6-8 hours.  May also start the back exercises as outlined to see if this is helpful.  I highly recommend you follow-up with your spine specialist after completing treatment for your abdominal pain to see if further testing such as MRI is needed.

## 2021-08-29 NOTE — ED Notes (Signed)
ED Provider at bedside. 

## 2021-12-28 ENCOUNTER — Encounter (HOSPITAL_BASED_OUTPATIENT_CLINIC_OR_DEPARTMENT_OTHER): Payer: Self-pay | Admitting: Emergency Medicine

## 2021-12-28 ENCOUNTER — Emergency Department (HOSPITAL_BASED_OUTPATIENT_CLINIC_OR_DEPARTMENT_OTHER)
Admission: EM | Admit: 2021-12-28 | Discharge: 2021-12-28 | Disposition: A | Discharge status: Home / Self Care | Payer: BC Managed Care – PPO | Attending: Emergency Medicine | Admitting: Emergency Medicine

## 2021-12-28 ENCOUNTER — Other Ambulatory Visit: Payer: Self-pay

## 2021-12-28 DIAGNOSIS — R1013 Epigastric pain: Secondary | ICD-10-CM | POA: Insufficient documentation

## 2021-12-28 DIAGNOSIS — R109 Unspecified abdominal pain: Secondary | ICD-10-CM | POA: Diagnosis present

## 2021-12-28 DIAGNOSIS — Z79899 Other long term (current) drug therapy: Secondary | ICD-10-CM | POA: Insufficient documentation

## 2021-12-28 LAB — LIPASE, BLOOD: Lipase: 38 U/L (ref 11–51)

## 2021-12-28 LAB — COMPREHENSIVE METABOLIC PANEL
ALT: 19 U/L (ref 0–44)
AST: 25 U/L (ref 15–41)
Albumin: 4.5 g/dL (ref 3.5–5.0)
Alkaline Phosphatase: 55 U/L (ref 38–126)
Anion gap: 12 (ref 5–15)
BUN: 13 mg/dL (ref 6–20)
CO2: 27 mmol/L (ref 22–32)
Calcium: 9.9 mg/dL (ref 8.9–10.3)
Chloride: 101 mmol/L (ref 98–111)
Creatinine, Ser: 0.73 mg/dL (ref 0.44–1.00)
GFR, Estimated: >60 mL/min (ref >60)
Glucose, Bld: 110 mg/dL — ABNORMAL HIGH (ref 70–99)
Potassium: 3.6 mmol/L (ref 3.5–5.1)
Sodium: 140 mmol/L (ref 135–145)
Total Bilirubin: 0.6 mg/dL (ref 0.3–1.2)
Total Protein: 8.3 g/dL — ABNORMAL HIGH (ref 6.5–8.1)

## 2021-12-28 LAB — CBC WITH DIFFERENTIAL/PLATELET
Abs Immature Granulocytes: 0.01 10*3/uL (ref 0.00–0.07)
Basophils Absolute: 0 10*3/uL (ref 0.0–0.1)
Basophils Relative: 1 %
Eosinophils Absolute: 0.1 10*3/uL (ref 0.0–0.5)
Eosinophils Relative: 1 %
HCT: 37.6 % (ref 36.0–46.0)
Hemoglobin: 12.2 g/dL (ref 12.0–15.0)
Immature Granulocytes: 0 %
Lymphocytes Relative: 51 %
Lymphs Abs: 2 10*3/uL (ref 0.7–4.0)
MCH: 28 pg (ref 26.0–34.0)
MCHC: 32.4 g/dL (ref 30.0–36.0)
MCV: 86.4 fL (ref 80.0–100.0)
Monocytes Absolute: 0.3 10*3/uL (ref 0.1–1.0)
Monocytes Relative: 8 %
Neutro Abs: 1.5 10*3/uL — ABNORMAL LOW (ref 1.7–7.7)
Neutrophils Relative %: 39 %
Platelets: 210 10*3/uL (ref 150–400)
RBC: 4.35 MIL/uL (ref 3.87–5.11)
RDW: 15.3 % (ref 11.5–15.5)
WBC: 3.9 10*3/uL — ABNORMAL LOW (ref 4.0–10.5)
nRBC: 0 % (ref 0.0–0.2)

## 2021-12-28 LAB — TROPONIN I (HIGH SENSITIVITY): Troponin I (High Sensitivity): 3 ng/L (ref <18)

## 2021-12-28 MED ORDER — DICYCLOMINE HCL 10 MG PO CAPS
10.0000 mg | ORAL_CAPSULE | Freq: Once | ORAL | Status: AC
  Administered 2021-12-28: 10 mg via ORAL
  Filled 2021-12-28: qty 1

## 2021-12-28 MED ORDER — LIDOCAINE VISCOUS HCL 2 % MT SOLN
15.0000 mL | Freq: Once | OROMUCOSAL | Status: AC
  Administered 2021-12-28: 15 mL via ORAL
  Filled 2021-12-28: qty 15

## 2021-12-28 MED ORDER — ALUM & MAG HYDROXIDE-SIMETH 200-200-20 MG/5ML PO SUSP
30.0000 mL | Freq: Once | ORAL | Status: AC
  Administered 2021-12-28: 30 mL via ORAL
  Filled 2021-12-28: qty 30

## 2021-12-28 NOTE — Discharge Instructions (Signed)
Please follow-up with your gastroenterologist as soon as possible.  Call their office and request a sooner appointment.  Continue to take your medications as prescribed. ?

## 2021-12-28 NOTE — ED Provider Notes (Signed)
?[facility] EMERGENCY DEPARTMENT ?Provider Note ? ? ?CSN: [id number] ?Arrival date & time: 12/28/21  1021 ? ?  ? ?History ? ?Chief Complaint  ?Patient presents with  ? Abdominal Pain  ? ? ?Natasha Hickman is a 56 y.o. female with a past medical history of esophageal narrowing, anxiety, achalasia presenting today with esophageal discomfort.  She says that this has been going on for nearly a year however over the past few days it is gotten worse.  She reports that she contacted her GI provider who told her to come to the emergency department if her pain is 10 out of 10.  She is unable to see them until 4/20.  Denies any nausea or vomiting but states that the pain sometimes radiates into her back or down her stomach.  No diarrhea or constipation.  No chest pain or difficulty breathing.  No history of gallstones or ACS.  Reports taking all of her medications as prescribed.  These medications include Pepcid, Bentyl and Protonix. ? ?Home Medications ?Prior to Admission medications   ?Medication Sig Start Date End Date Taking? Authorizing Provider  ?ALPRAZolam (XANAX) 0.25 MG tablet Take 0.125 mg by mouth once as needed. For anxiety    [provider]  ?amLODipine-atorvastatin (CADUET) 5-20 MG tablet Take by mouth. 11/06/20   [provider]  ?amLODipine-olmesartan (AZOR) 5-20 MG tablet Take 1 tablet by mouth daily.      [provider]  ?Ascorbic Acid (VITAMIN C PO) Take by mouth.    [provider]  ?atenolol (TENORMIN) 25 MG tablet Take 25 mg by mouth daily. 10/14/20   [provider]  ?BLACK CURRANT SEED OIL PO Take by mouth.    [provider]  ?clonazePAM (KLONOPIN) 0.5 MG tablet Take 0.5 mg by mouth 2 (two) times daily as needed.    [provider]  ?cyclobenzaprine (FLEXERIL) 10 MG tablet Take 1 tablet (10 mg total) by mouth 2 (two) times daily as needed for muscle spasms. 11/06/20   Renne Crigler, PA-C  ?cyclobenzaprine (FLEXERIL) 10 MG tablet  Take 1 tablet (10 mg total) by mouth 2 (two) times daily as needed for muscle spasms. 04/13/21   Tegeler, Canary Brim, MD  ?dicyclomine (BENTYL) 10 MG capsule Take 10 mg by mouth 4 (four) times daily -  before meals and at bedtime.    [provider]  ?DOCOSAHEXAENOIC ACID PO Take 1 g by mouth.    [provider]  ?esomeprazole (NEXIUM) 40 MG capsule Take 40 mg by mouth daily before breakfast.    [provider]  ?famotidine (PEPCID) 20 MG tablet Take 1 tablet (20 mg total) by mouth 2 (two) times daily. 08/29/21   Arby Barrette, MD  ?famotidine (PEPCID) 40 MG tablet Take 40 mg by mouth daily.    [provider]  ?ferrous sulfate 325 (65 FE) MG tablet Take 325 mg by mouth 2 (two) times daily with a meal.    [provider]  ?fish oil-omega-3 fatty acids 1000 MG capsule Take 1 g by mouth 3 (three) times daily.    [provider]  ?Flaxseed, Linseed, (FLAXSEED OIL) 1000 MG CAPS Take 1 capsule by mouth 2 (two) times daily.    [provider]  ?fluticasone (FLONASE) 50 MCG/ACT nasal spray 1-2 SPRAY, NASAL, IN EACH NOSTRIL DAILY 04/17/15   [provider]  ?gabapentin (NEURONTIN) 100 MG capsule Take 100 mg by mouth 3 (three) times daily.    [provider]  ?Garlic  1000 MG CAPS Take 1 capsule by mouth daily.    [provider]  ?meloxicam (MOBIC) 7.5 MG tablet Take 1 tablet (7.5 mg total) by mouth daily. 11/06/20   Renne Crigler, PA-C  ?methocarbamol (ROBAXIN) 500 MG tablet Take 1 tablet (500 mg total) by mouth every 6 (six) hours as needed for muscle spasms. 08/29/21   Arby Barrette, MD  ?Multiple Vitamins-Minerals (MULTIVITAMIN WITH MINERALS) tablet Take 1 tablet by mouth daily.      [provider]  ?neomycin-polymyxin-gramicidin (NEOSPORIN) 1.75-10000-.025 ophthalmic solution Place 1-2 drops into the right eye 4 (four) times daily.    [provider]  ?pantoprazole (PROTONIX) 40 MG tablet Take 40 mg by mouth  2 (two) times daily. 10/05/20   [provider]  ?RABEprazole (ACIPHEX) 20 MG tablet Take by mouth. 09/26/16   [provider]  ?sucralfate (CARAFATE) 1 g tablet Take 1 tablet (1 g total) by mouth 4 (four) times daily -  with meals and at bedtime. 08/29/21   Arby Barrette, MD  ?VITAMIN D PO Take by mouth.    [provider]  ?Vitamin D, Ergocalciferol, (DRISDOL) 50000 units CAPS capsule Take 50,000 Units by mouth every 7 (seven) days.    [provider]  ?omeprazole (PRILOSEC) 40 MG capsule Take 40 mg by mouth daily.    12/21/11  [provider]  ?   ? ?Allergies    ?Adhesive [tape], Demerol [meperidine hcl], Meperidine hcl, Percocet [oxycodone-acetaminophen], and Septra [bactrim]   ? ?Review of Systems   ?Review of Systems  ?Gastrointestinal:  Positive for abdominal pain.  ?See HPI ? ?Physical Exam ?Updated Vital Signs ?BP (!) 166/116 (BP Location: Left Arm)   Pulse 81   Temp 98.1 ?F (36.7 ?C) (Oral)   Resp 18   Ht 5\' 1"  (1.549 m)   Wt 58.5 kg   SpO2 100%   BMI 24.37 kg/m?  ?Physical Exam ?Vitals and nursing note reviewed.  ?Constitutional:   ?   General: She is not in acute distress. ?   Appearance: Normal appearance.  ?HENT:  ?   Head: Normocephalic and atraumatic.  ?Eyes:  ?   General: No scleral icterus. ?   Conjunctiva/sclera: Conjunctivae normal.  ?Cardiovascular:  ?   Rate and Rhythm: Normal rate and regular rhythm.  ?Pulmonary:  ?   Effort: Pulmonary effort is normal. No respiratory distress.  ?   Breath sounds: No wheezing.  ?Abdominal:  ?   General: Abdomen is flat.  ?   Palpations: Abdomen is soft.  ?   Tenderness: There is abdominal tenderness in the epigastric area.  ?Skin: ?   Findings: No rash.  ?Neurological:  ?   Mental Status: She is alert.  ?Psychiatric:     ?   Mood and Affect: Mood normal.  ? ? ?ED Results / Procedures / Treatments   ?Labs ?(all labs ordered are listed, but only abnormal results are displayed) ?Labs Reviewed  ?COMPREHENSIVE  METABOLIC PANEL - Abnormal; Notable for the following components:  ?    Result Value  ? Glucose, Bld 110 (*)   ? Total Protein 8.3 (*)   ? All other components within normal limits  ?CBC WITH DIFFERENTIAL/PLATELET - Abnormal; Notable for the following components:  ? WBC 3.9 (*)   ? Neutro Abs 1.5 (*)   ? All other components within normal limits  ?LIPASE, BLOOD  ?CBC WITH DIFFERENTIAL/PLATELET  ?URINALYSIS, ROUTINE W REFLEX MICROSCOPIC  ?TROPONIN I (HIGH SENSITIVITY)  ? ? ?EKG ?  EKG Interpretation ? ?Date/Time:  Tuesday December 28 2021 10:51:08 EDT ?Ventricular Rate:  94 ?PR Interval:  142 ?QRS Duration: 74 ?QT Interval:  356 ?QTC Calculation: 445 ?R Axis:   47 ?Text Interpretation: Sinus rhythm with occasional Premature ventricular complexes Possible Left atrial enlargement Borderline ECG When compared with ECG of 13-Apr-2021 14:58, PREVIOUS ECG IS PRESENT No significant change since last tracing Confirmed by Jacalyn LefevreHaviland, Julie [phone]) on 12/28/2021 12:09:27 PM ? ?Radiology ?No results found. ? ?Procedures ?Procedures  ?Normal sinus rhythm with a normal rate ? ?Medications Ordered in ED ?Medications  ?dicyclomine (BENTYL) capsule 10 mg (10 mg Oral Given 12/28/21 1215)  ?alum & mag hydroxide-simeth (MAALOX/MYLANTA) 200-200-20 MG/5ML suspension 30 mL (30 mLs Oral Given 12/28/21 1215)  ?  And  ?lidocaine (XYLOCAINE) 2 % viscous mouth solution 15 mL (15 mLs Oral Given 12/28/21 1216)  ? ? ?ED Course/ Medical Decision Making/ A&P ?  ?                        ?Medical Decision Making ?Amount and/or Complexity of Data Reviewed ?Labs: ordered. ? ?Risk ?OTC drugs. ?Prescription drug management. ? ? ?Patient presents to the ED for concern of epigastric pain.  Differential includes but is not limited to GERD, gastritis, PUD, ACS, cholecystitis, esophagitis, esophageal stricture, Barrett's esophagus, GI bug. ? ?Per internal/external chart review: I was able to see the patient's conversations with her gastroenterologist.  She reports that  they sent her to the emergency department due to increased pain.  It appears she contacted their office multiple times saying that she is unable to wait to the end of August for pain control and they suggested

## 2021-12-28 NOTE — ED Triage Notes (Signed)
Pt states she has been having epigastric pain for one month and see GI for same.  Noted in chart where the provider believes she is having esophageal spasms.  Pt states she has been taking medications as prescribed.  Pt states she feels like her pain is radiating to her back.  No N/V.  No constipation or diarrhea.  Pt states her GI sent to ED for evaluation.  Pt admits to having esophageal surgery in the past.  Pt is crying in triage. ?

## 2022-08-08 IMAGING — DX DG CHEST 2V
2 series · 2 of 2 positions shown · non-contrast
Comparison: November 06, 2020

CLINICAL DATA: Chest pain

EXAM:
CHEST - 2 VIEW

[chest pa]
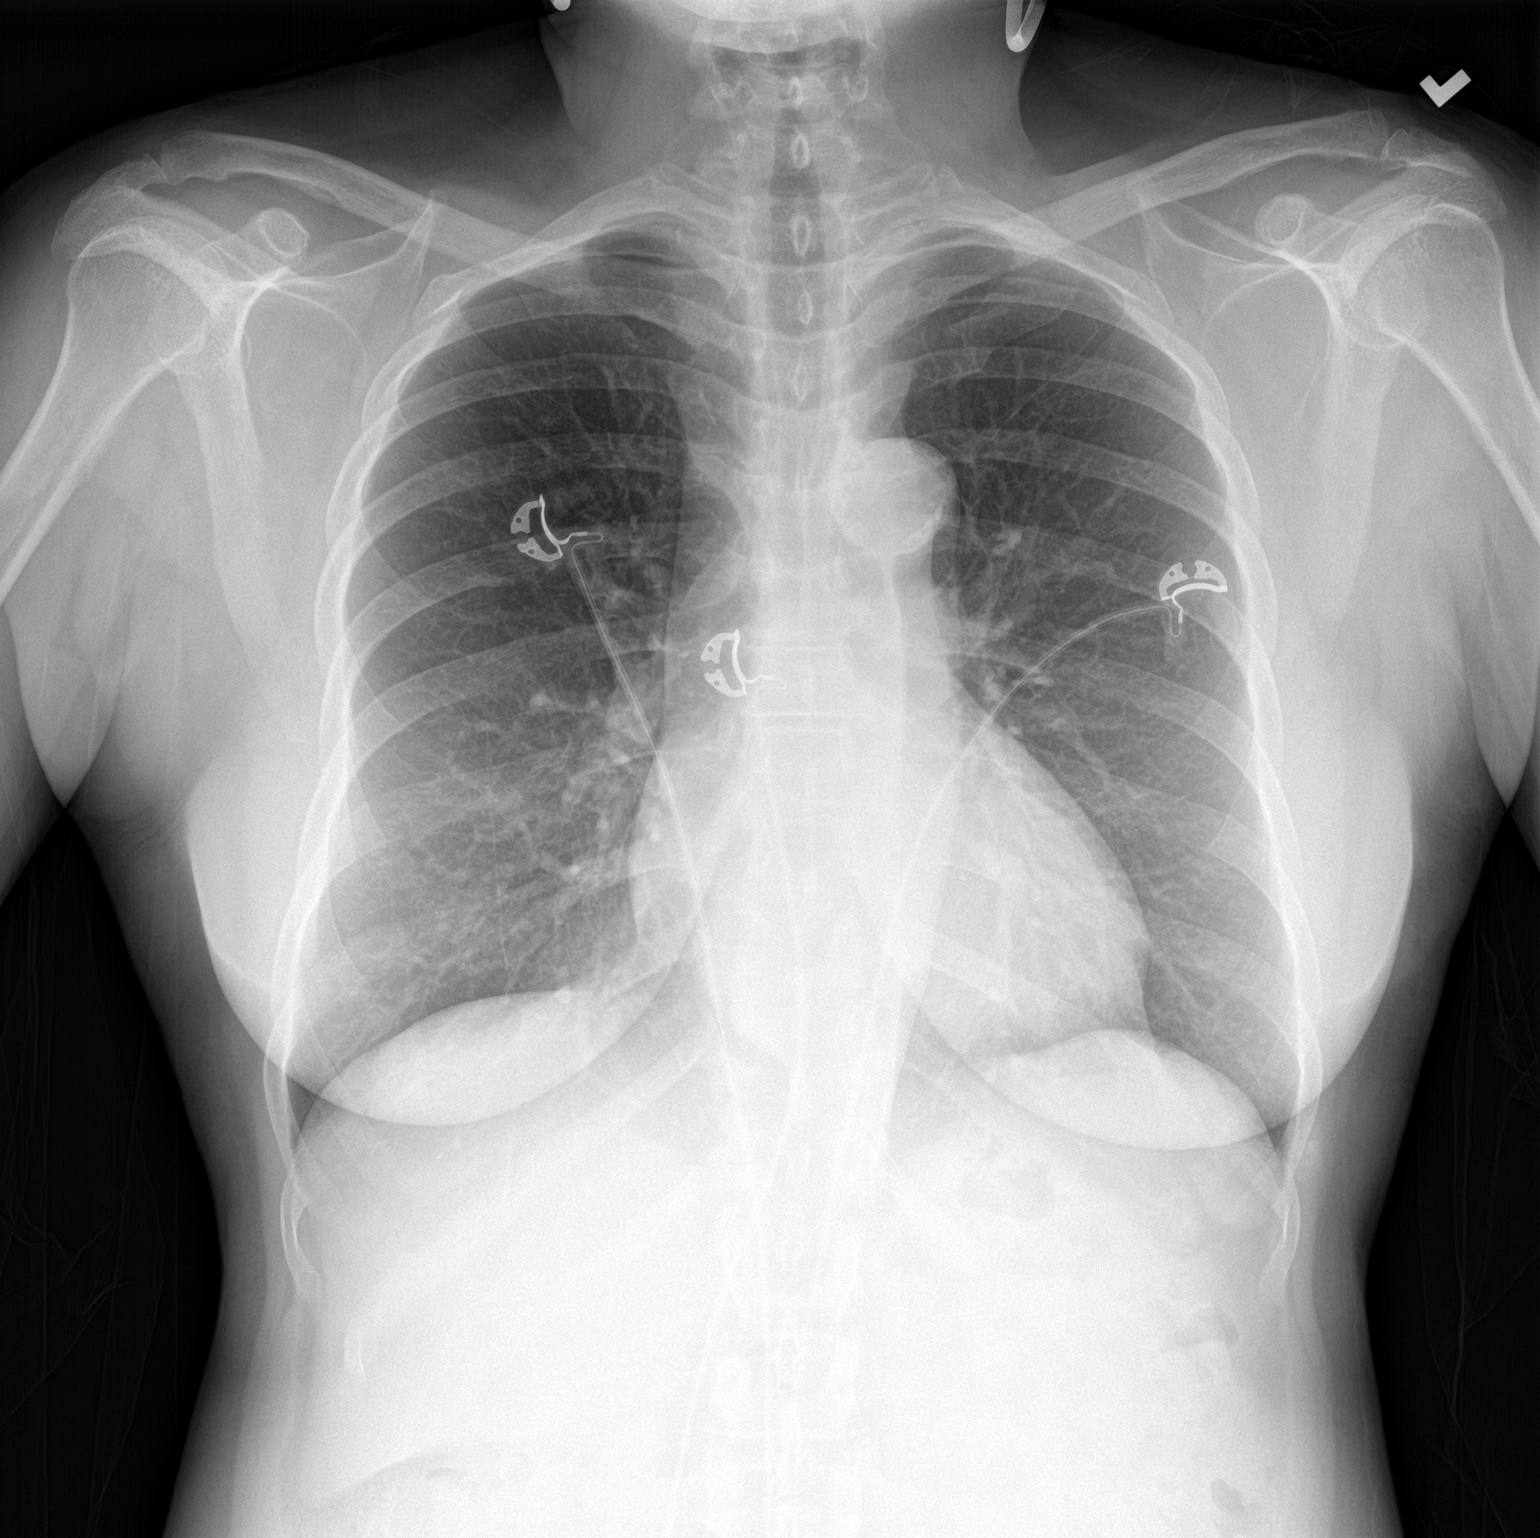

[chest lat]
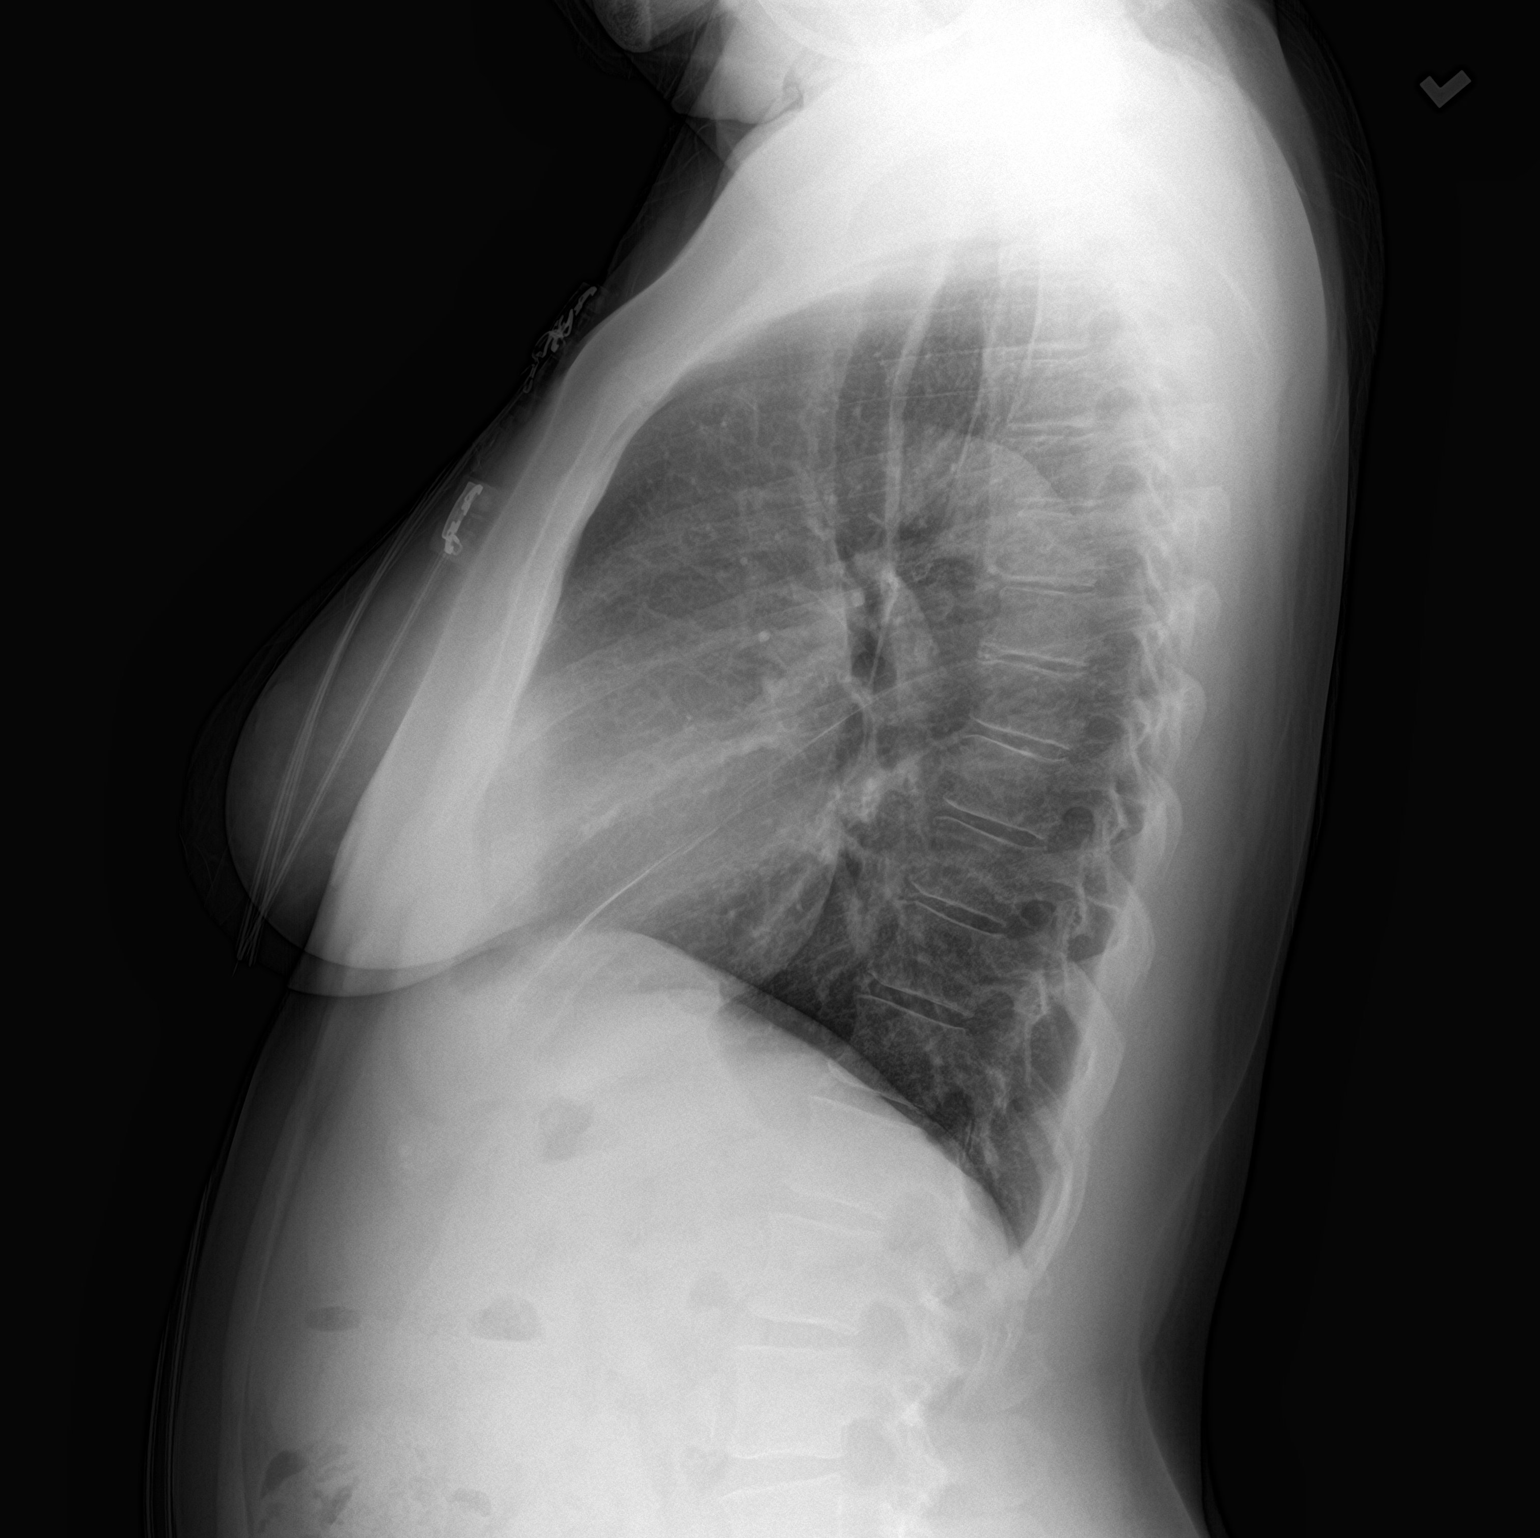

[2 of 2 positions shown; findings below may reference images not displayed]

FINDINGS: Lungs are clear. Heart size and pulmonary vascularity are normal. No
adenopathy. There is aortic atherosclerosis. No bone lesions.
IMPRESSION: Lungs clear. Cardiac silhouette within normal limits. Aortic
Atherosclerosis ([id number]).

## 2023-05-22 ENCOUNTER — Other Ambulatory Visit: Payer: Self-pay

## 2023-05-22 ENCOUNTER — Emergency Department (HOSPITAL_BASED_OUTPATIENT_CLINIC_OR_DEPARTMENT_OTHER)
Admission: EM | Admit: 2023-05-22 | Discharge: 2023-05-22 | Disposition: A | Discharge status: Home / Self Care | Payer: BC Managed Care – PPO | Attending: Emergency Medicine | Admitting: Emergency Medicine

## 2023-05-22 ENCOUNTER — Encounter (HOSPITAL_BASED_OUTPATIENT_CLINIC_OR_DEPARTMENT_OTHER): Payer: Self-pay | Admitting: Emergency Medicine

## 2023-05-22 DIAGNOSIS — R0981 Nasal congestion: Secondary | ICD-10-CM | POA: Insufficient documentation

## 2023-05-22 DIAGNOSIS — J0110 Acute frontal sinusitis, unspecified: Secondary | ICD-10-CM | POA: Insufficient documentation

## 2023-05-22 DIAGNOSIS — I10 Essential (primary) hypertension: Secondary | ICD-10-CM | POA: Insufficient documentation

## 2023-05-22 DIAGNOSIS — R112 Nausea with vomiting, unspecified: Secondary | ICD-10-CM | POA: Insufficient documentation

## 2023-05-22 DIAGNOSIS — Z1152 Encounter for screening for COVID-19: Secondary | ICD-10-CM | POA: Insufficient documentation

## 2023-05-22 DIAGNOSIS — R197 Diarrhea, unspecified: Secondary | ICD-10-CM | POA: Insufficient documentation

## 2023-05-22 DIAGNOSIS — R519 Headache, unspecified: Secondary | ICD-10-CM | POA: Diagnosis present

## 2023-05-22 DIAGNOSIS — D72819 Decreased white blood cell count, unspecified: Secondary | ICD-10-CM | POA: Diagnosis not present

## 2023-05-22 LAB — CBC WITH DIFFERENTIAL/PLATELET
Abs Immature Granulocytes: 0 10*3/uL (ref 0.00–0.07)
Basophils Absolute: 0 10*3/uL (ref 0.0–0.1)
Basophils Relative: 1 %
Eosinophils Absolute: 0 10*3/uL (ref 0.0–0.5)
Eosinophils Relative: 1 %
HCT: 38.1 % (ref 36.0–46.0)
Hemoglobin: 11.9 g/dL — ABNORMAL LOW (ref 12.0–15.0)
Immature Granulocytes: 0 %
Lymphocytes Relative: 47 %
Lymphs Abs: 1.5 10*3/uL (ref 0.7–4.0)
MCH: 26 pg (ref 26.0–34.0)
MCHC: 31.2 g/dL (ref 30.0–36.0)
MCV: 83.4 fL (ref 80.0–100.0)
Monocytes Absolute: 0.2 10*3/uL (ref 0.1–1.0)
Monocytes Relative: 5 %
Neutro Abs: 1.5 10*3/uL — ABNORMAL LOW (ref 1.7–7.7)
Neutrophils Relative %: 46 %
Platelets: 207 10*3/uL (ref 150–400)
RBC: 4.57 MIL/uL (ref 3.87–5.11)
RDW: 15.5 % (ref 11.5–15.5)
WBC: 3.2 10*3/uL — ABNORMAL LOW (ref 4.0–10.5)
nRBC: 0 % (ref 0.0–0.2)

## 2023-05-22 LAB — RESP PANEL BY RT-PCR (RSV, FLU A&B, COVID)  RVPGX2
Influenza A by PCR: NEGATIVE
Influenza B by PCR: NEGATIVE
Resp Syncytial Virus by PCR: NEGATIVE
SARS Coronavirus 2 by RT PCR: NEGATIVE

## 2023-05-22 LAB — COMPREHENSIVE METABOLIC PANEL
ALT: 26 U/L (ref 0–44)
AST: 27 U/L (ref 15–41)
Albumin: 4.1 g/dL (ref 3.5–5.0)
Alkaline Phosphatase: 72 U/L (ref 38–126)
Anion gap: 13 (ref 5–15)
BUN: 11 mg/dL (ref 6–20)
CO2: 23 mmol/L (ref 22–32)
Calcium: 9.4 mg/dL (ref 8.9–10.3)
Chloride: 103 mmol/L (ref 98–111)
Creatinine, Ser: 0.73 mg/dL (ref 0.44–1.00)
GFR, Estimated: >60 mL/min (ref >60)
Glucose, Bld: 106 mg/dL — ABNORMAL HIGH (ref 70–99)
Potassium: 3.8 mmol/L (ref 3.5–5.1)
Sodium: 139 mmol/L (ref 135–145)
Total Bilirubin: 0.4 mg/dL (ref 0.3–1.2)
Total Protein: 7.5 g/dL (ref 6.5–8.1)

## 2023-05-22 MED ORDER — ACETAMINOPHEN 325 MG PO TABS
650.0000 mg | ORAL_TABLET | Freq: Once | ORAL | Status: AC
  Administered 2023-05-22: 650 mg via ORAL
  Filled 2023-05-22: qty 2

## 2023-05-22 MED ORDER — AMOXICILLIN-POT CLAVULANATE 875-125 MG PO TABS: 1.0000 | ORAL_TABLET | Freq: Two times a day (BID) | ORAL | 0 refills | Status: AC

## 2023-05-22 NOTE — Discharge Instructions (Addendum)
You were seen in the ER today for a headache and nasal congestion. Your labs were thankfully reassuring, but I believe your symptoms are likely due to sinus pressure given the amount of congestion you have had. I have sent a prescription for Augmentin for you to take for the next week. Please take this as prescribed. You can continue to take Tylenol, ibuprofen, or Aleve for headaches as needed. If you have any worsening of your symptoms, please return to the ER.

## 2023-05-22 NOTE — ED Provider Notes (Signed)
[place] [facility] Provider Note   CSN: [id number] Arrival date & time: 05/22/23  9562     History Chief Complaint  Patient presents with   Headache   Nasal Congestion    Natasha Hickman is a 57 y.o. female.  Patient with past history significant for hypertension, allergic rhinitis, and headaches presents to the emergency department concerns of a headache and nasal congestion.  Reports that this has been ongoing for last 2 or 3 days, but reports started feeling ill about 1 week ago with mild headache and cold-like symptoms.  Endorses some mild nausea but no episodes of vomiting or diarrhea.  Denies any abdominal pain, fever, dysuria, hematuria, increased urinary frequency or urgency.  Denies any obvious or known sick contacts but does state that she was around her son about a week ago who was sick with an unknown cause.  Reports that she feels a headache along the frontal maxillary sinuses but also all at the crown of her head.  Has tried taking over-the-counter medications without significant improvement in symptoms.  Denies any facial droop, slurred speech, unilateral arm numbness or weakness.   Headache      Home Medications Prior to Admission medications   Medication Sig Start Date End Date Taking? Authorizing Provider  amoxicillin-clavulanate (AUGMENTIN) 875-125 MG tablet Take 1 tablet by mouth every 12 (twelve) hours for 7 days. 05/22/23 05/29/23 Yes Smitty Knudsen, PA-C  ALPRAZolam (XANAX) 0.25 MG tablet Take 0.125 mg by mouth once as needed. For anxiety    [provider]  amLODipine-atorvastatin (CADUET) 5-20 MG tablet Take by mouth. 11/06/20   [provider]  amLODipine-olmesartan (AZOR) 5-20 MG tablet Take 1 tablet by mouth daily.      [provider]  Ascorbic Acid (VITAMIN C PO) Take by mouth.    [provider]  atenolol (TENORMIN) 25 MG tablet Take 25 mg by mouth daily. 10/14/20   [provider]  BLACK CURRANT SEED OIL PO Take by mouth.    [provider]  clonazePAM (KLONOPIN) 0.5 MG tablet Take 0.5 mg by mouth 2 (two) times daily as needed.    [provider]  cyclobenzaprine (FLEXERIL) 10 MG tablet Take 1 tablet (10 mg total) by mouth 2 (two) times daily as needed for muscle spasms. 11/06/20   Renne Crigler, PA-C  cyclobenzaprine (FLEXERIL) 10 MG tablet Take 1 tablet (10 mg total) by mouth 2 (two) times daily as needed for muscle spasms. 04/13/21   Tegeler, Canary Brim, MD  dicyclomine (BENTYL) 10 MG capsule Take 10 mg by mouth 4 (four) times daily -  before meals and at bedtime.    [provider]  DOCOSAHEXAENOIC ACID PO Take 1 g by mouth.    [provider]  esomeprazole (NEXIUM) 40 MG capsule Take 40 mg by mouth daily before breakfast.    [provider]  famotidine (PEPCID) 20 MG tablet Take 1 tablet (20 mg total) by mouth 2 (two) times daily. 08/29/21   Arby Barrette, MD  famotidine (PEPCID) 40 MG tablet Take 40 mg by mouth daily.    [provider]  ferrous sulfate 325 (65 FE) MG tablet Take 325 mg by mouth 2 (two) times daily with a meal.    [provider]  fish oil-omega-3 fatty acids 1000 MG capsule Take 1 g by mouth 3 (three) times daily.    [provider]  Flaxseed, Linseed, (FLAXSEED OIL) 1000 MG CAPS Take  1 capsule by mouth 2 (two) times daily.    [provider]  fluticasone (FLONASE) 50 MCG/ACT nasal spray 1-2 SPRAY, NASAL, IN EACH NOSTRIL DAILY 04/17/15   [provider]  gabapentin (NEURONTIN) 100 MG capsule Take 100 mg by mouth 3 (three) times daily.    [provider]  Garlic 1000 MG CAPS Take 1 capsule by mouth daily.    [provider]  meloxicam (MOBIC) 7.5 MG tablet Take 1 tablet (7.5 mg total) by mouth daily. 11/06/20   Renne Crigler, PA-C  methocarbamol (ROBAXIN) 500 MG tablet Take 1 tablet (500 mg total) by mouth every 6 (six) hours as  needed for muscle spasms. 08/29/21   Arby Barrette, MD  Multiple Vitamins-Minerals (MULTIVITAMIN WITH MINERALS) tablet Take 1 tablet by mouth daily.      [provider]  neomycin-polymyxin-gramicidin (NEOSPORIN) 1.75-10000-.025 ophthalmic solution Place 1-2 drops into the right eye 4 (four) times daily.    [provider]  pantoprazole (PROTONIX) 40 MG tablet Take 40 mg by mouth 2 (two) times daily. 10/05/20   [provider]  RABEprazole (ACIPHEX) 20 MG tablet Take by mouth. 09/26/16   [provider]  sucralfate (CARAFATE) 1 g tablet Take 1 tablet (1 g total) by mouth 4 (four) times daily -  with meals and at bedtime. 08/29/21   Arby Barrette, MD  VITAMIN D PO Take by mouth.    [provider]  Vitamin D, Ergocalciferol, (DRISDOL) 50000 units CAPS capsule Take 50,000 Units by mouth every 7 (seven) days.    [provider]  omeprazole (PRILOSEC) 40 MG capsule Take 40 mg by mouth daily.    12/21/11  [provider]      Allergies    Adhesive [tape], Demerol [meperidine hcl], Meperidine hcl, Percocet [oxycodone-acetaminophen], and Septra [bactrim]    Review of Systems   Review of Systems  Neurological:  Positive for headaches. Negative for facial asymmetry.  All other systems reviewed and are negative.   Physical Exam Updated Vital Signs BP (!) 145/103   Pulse 87   Temp 98.3 F (36.8 C)   Resp 16   Wt 57.6 kg   SpO2 99%   BMI 24.00 kg/m  Physical Exam Vitals and nursing note reviewed.  Constitutional:      General: She is not in acute distress.    Appearance: She is well-developed.  HENT:     Head: Normocephalic and atraumatic.  Eyes:     General: No visual field deficit or scleral icterus.    Extraocular Movements: Extraocular movements intact.     Conjunctiva/sclera: Conjunctivae normal.     Pupils: Pupils are equal, round, and reactive to light.  Cardiovascular:     Rate and Rhythm: Normal rate and regular  rhythm.     Heart sounds: No murmur heard. Pulmonary:     Effort: Pulmonary effort is normal. No respiratory distress.     Breath sounds: Normal breath sounds.  Abdominal:     Palpations: Abdomen is soft.     Tenderness: There is no abdominal tenderness.  Musculoskeletal:        General: No swelling.     Cervical back: Neck supple.  Skin:    General: Skin is warm and dry.     Capillary Refill: Capillary refill takes less than 2 seconds.  Neurological:     Mental Status: She is alert.     Cranial Nerves: No cranial nerve deficit or facial asymmetry.     Sensory:  No sensory deficit.     Motor: No weakness.  Psychiatric:        Mood and Affect: Mood normal.     ED Results / Procedures / Treatments   Labs (all labs ordered are listed, but only abnormal results are displayed) Labs Reviewed  CBC WITH DIFFERENTIAL/PLATELET - Abnormal; Notable for the following components:      Result Value   WBC 3.2 (*)    Hemoglobin 11.9 (*)    Neutro Abs 1.5 (*)    All other components within normal limits  COMPREHENSIVE METABOLIC PANEL - Abnormal; Notable for the following components:   Glucose, Bld 106 (*)    All other components within normal limits  RESP PANEL BY RT-PCR (RSV, FLU A&B, COVID)  RVPGX2    EKG None  Radiology No results found.  Procedures Procedures   Medications Ordered in ED Medications  acetaminophen (TYLENOL) tablet 650 mg (650 mg Oral Given 05/22/23 1144)    ED Course/ Medical Decision Making/ A&P                               Medical Decision Making Amount and/or Complexity of Data Reviewed Labs: ordered.  Risk OTC drugs. Prescription drug management.   This patient presents to the ED for concern of headache, nasal congestion.  Differential diagnosis includes viral URI, COVID-19, influenza, migraine headache, sinusitis   Lab Tests:  I Ordered, and personally interpreted labs.  The pertinent results include: CBC with mild leukocytopenia at 3.2, CMP  unremarkable, respiratory viral panel negative   Medicines ordered and prescription drug management:  I ordered medication including Tylenol for pain Reevaluation of the patient after these medicines showed that the patient improved I have reviewed the patients home medicines and have made adjustments as needed   Problem List / ED Course:  Patient presents to the emergency department complaints of headache and nasal congestion.  Ongoing for the last several days without improvement.  History of migraine headaches but reports this does not feel typical to her migraine headaches.  Has been around a son who is sick but otherwise denies any sick contacts or exposures.  Respiratory viral panel ordered from triage which was negative.  Will add in basic labs for further evaluation of potential causes and headache such as infectious versus electrolyte disturbances.  I do not believe that patient currently would benefit from CT imaging.  May consider a migraine cocktail. Labs largely unremarkable. No obvious cause of headache noted from labs, but clinically appears to be likely due to sinus pressure given increase fullness feeling in frontal and maxillary sinuses. Given duration and severity of symptoms, will prescribe Augmentin for suspected bacterial sinusitis. Encouraged patient to return to the ER if symptoms are worsening. Strict return precautions discussed. All questions answered prior to patient discharge.  Final Clinical Impression(s) / ED Diagnoses Final diagnoses:  Acute non-recurrent frontal sinusitis  Hypertension, unspecified type    Rx / DC Orders ED Discharge Orders          Ordered    amoxicillin-clavulanate (AUGMENTIN) 875-125 MG tablet  Every 12 hours        05/22/23 1231              Smitty Knudsen, PA-C 05/22/23 1526    Rolan Bucco, MD 05/23/23 [phone]

## 2023-05-22 NOTE — ED Triage Notes (Signed)
Headache x 2-3 days , nasal congestion , nausea , no emesis

## 2023-06-07 ENCOUNTER — Emergency Department (HOSPITAL_BASED_OUTPATIENT_CLINIC_OR_DEPARTMENT_OTHER)
Admission: EM | Admit: 2023-06-07 | Discharge: 2023-06-07 | Disposition: A | Discharge status: Home / Self Care | Payer: BC Managed Care – PPO | Attending: Emergency Medicine | Admitting: Emergency Medicine

## 2023-06-07 ENCOUNTER — Encounter (HOSPITAL_BASED_OUTPATIENT_CLINIC_OR_DEPARTMENT_OTHER): Payer: Self-pay

## 2023-06-07 ENCOUNTER — Other Ambulatory Visit: Payer: Self-pay

## 2023-06-07 DIAGNOSIS — I10 Essential (primary) hypertension: Secondary | ICD-10-CM | POA: Insufficient documentation

## 2023-06-07 DIAGNOSIS — R1013 Epigastric pain: Secondary | ICD-10-CM | POA: Insufficient documentation

## 2023-06-07 DIAGNOSIS — Z79899 Other long term (current) drug therapy: Secondary | ICD-10-CM | POA: Diagnosis not present

## 2023-06-07 DIAGNOSIS — R519 Headache, unspecified: Secondary | ICD-10-CM

## 2023-06-07 LAB — BASIC METABOLIC PANEL
Anion gap: 11 (ref 5–15)
BUN: 18 mg/dL (ref 6–20)
CO2: 24 mmol/L (ref 22–32)
Calcium: 9.5 mg/dL (ref 8.9–10.3)
Chloride: 103 mmol/L (ref 98–111)
Creatinine, Ser: 0.72 mg/dL (ref 0.44–1.00)
GFR, Estimated: >60 mL/min (ref >60)
Glucose, Bld: 105 mg/dL — ABNORMAL HIGH (ref 70–99)
Potassium: 3.7 mmol/L (ref 3.5–5.1)
Sodium: 138 mmol/L (ref 135–145)

## 2023-06-07 LAB — CBC WITH DIFFERENTIAL/PLATELET
Abs Immature Granulocytes: 0.01 10*3/uL (ref 0.00–0.07)
Basophils Absolute: 0 10*3/uL (ref 0.0–0.1)
Basophils Relative: 1 %
Eosinophils Absolute: 0.1 10*3/uL (ref 0.0–0.5)
Eosinophils Relative: 2 %
HCT: 38.2 % (ref 36.0–46.0)
Hemoglobin: 12.1 g/dL (ref 12.0–15.0)
Immature Granulocytes: 0 %
Lymphocytes Relative: 51 %
Lymphs Abs: 2.4 10*3/uL (ref 0.7–4.0)
MCH: 26.3 pg (ref 26.0–34.0)
MCHC: 31.7 g/dL (ref 30.0–36.0)
MCV: 83 fL (ref 80.0–100.0)
Monocytes Absolute: 0.3 10*3/uL (ref 0.1–1.0)
Monocytes Relative: 6 %
Neutro Abs: 1.9 10*3/uL (ref 1.7–7.7)
Neutrophils Relative %: 40 %
Platelets: 216 10*3/uL (ref 150–400)
RBC: 4.6 MIL/uL (ref 3.87–5.11)
RDW: 17.1 % — ABNORMAL HIGH (ref 11.5–15.5)
WBC: 4.7 10*3/uL (ref 4.0–10.5)
nRBC: 0 % (ref 0.0–0.2)

## 2023-06-07 MED ORDER — ACETAMINOPHEN 500 MG PO TABS
1000.0000 mg | ORAL_TABLET | Freq: Once | ORAL | Status: AC
  Administered 2023-06-07: 1000 mg via ORAL
  Filled 2023-06-07: qty 2

## 2023-06-07 NOTE — Discharge Instructions (Signed)
Natasha Hickman, It was a pleasure taking care of you today. Please be sure to see your primary doctor tomorrow to discuss your headaches, blood pressure, and sinus pressure. I do not believe you currently have a sinus infection now that you have completed your antibiotic. In the meantime, you can take more tylenol for your headache. Limit it to 4000mg  per day. Best, Dr. Ninfa Meeker

## 2023-06-07 NOTE — ED Triage Notes (Signed)
Was seen here a week ago, dx with sinusitis and has taken all her antibiotics.  Here today for HA and HTN Rates 7/10 +nausea

## 2023-06-07 NOTE — ED Provider Notes (Signed)
[place] [facility] Provider Note   CSN: [id number] Arrival date & time: 06/07/23  1947     History  Chief Complaint  Patient presents with   Hypertension    Natasha Hickman is a 57 y.o. female.  Natasha Hickman is a 57 yo F with PMH HTN, Barrett's esophagus, recent ED visit for sinus pressure who presented to [facility] with concern of headache, sinus pressure, HTN. This was evaluated this evening at urgent care and she was urged to visit the ED. Pt was in this ED one week ago with similar symptoms and treatment was started with augmentin for sinusitis. She took the full course and feels somewhat better but is having frontal headaches. These come and go and are moderately relieved with tylenol. She still has sinus pressure and is having runny nose, draining clear fluid. She is taking her hypertensive medicines and has not missed doses. She denies fevers, chills, dizziness, dyspnea, chest pain, n/v, diarrhea. She has mild epigastric pain that is consistent with her GERD and not changed from baseline.   Hypertension Associated symptoms include headaches. Pertinent negatives include no chest pain, no abdominal pain and no shortness of breath.       Home Medications Prior to Admission medications   Medication Sig Start Date End Date Taking? Authorizing Provider  ALPRAZolam (XANAX) 0.25 MG tablet Take 0.125 mg by mouth once as needed. For anxiety    [provider]  amLODipine-atorvastatin (CADUET) 5-20 MG tablet Take by mouth. 11/06/20   [provider]  amLODipine-olmesartan (AZOR) 5-20 MG tablet Take 1 tablet by mouth daily.      [provider]  Ascorbic Acid (VITAMIN C PO) Take by mouth.    [provider]  atenolol (TENORMIN) 25 MG tablet Take 25 mg by mouth daily. 10/14/20   [provider]  BLACK CURRANT SEED OIL PO Take by mouth.    [provider]  clonazePAM (KLONOPIN) 0.5 MG  tablet Take 0.5 mg by mouth 2 (two) times daily as needed.    [provider]  cyclobenzaprine (FLEXERIL) 10 MG tablet Take 1 tablet (10 mg total) by mouth 2 (two) times daily as needed for muscle spasms. 11/06/20   Renne Crigler, PA-C  cyclobenzaprine (FLEXERIL) 10 MG tablet Take 1 tablet (10 mg total) by mouth 2 (two) times daily as needed for muscle spasms. 04/13/21   Tegeler, Canary Brim, MD  dicyclomine (BENTYL) 10 MG capsule Take 10 mg by mouth 4 (four) times daily -  before meals and at bedtime.    [provider]  DOCOSAHEXAENOIC ACID PO Take 1 g by mouth.    [provider]  esomeprazole (NEXIUM) 40 MG capsule Take 40 mg by mouth daily before breakfast.    [provider]  famotidine (PEPCID) 20 MG tablet Take 1 tablet (20 mg total) by mouth 2 (two) times daily. 08/29/21   Arby Barrette, MD  famotidine (PEPCID) 40 MG tablet Take 40 mg by mouth daily.    [provider]  ferrous sulfate 325 (65 FE) MG tablet Take 325 mg by mouth 2 (two) times daily with a meal.    [provider]  fish oil-omega-3 fatty acids 1000 MG capsule Take 1 g by mouth 3 (three) times daily.    [provider]  Flaxseed, Linseed, (FLAXSEED OIL) 1000 MG CAPS Take 1 capsule by mouth 2 (two) times daily.    [provider]  fluticasone (  FLONASE) 50 MCG/ACT nasal spray 1-2 SPRAY, NASAL, IN EACH NOSTRIL DAILY 04/17/15   [provider]  gabapentin (NEURONTIN) 100 MG capsule Take 100 mg by mouth 3 (three) times daily.    [provider]  Garlic 1000 MG CAPS Take 1 capsule by mouth daily.    [provider]  meloxicam (MOBIC) 7.5 MG tablet Take 1 tablet (7.5 mg total) by mouth daily. 11/06/20   Renne Crigler, PA-C  methocarbamol (ROBAXIN) 500 MG tablet Take 1 tablet (500 mg total) by mouth every 6 (six) hours as needed for muscle spasms. 08/29/21   Arby Barrette, MD  Multiple Vitamins-Minerals (MULTIVITAMIN WITH MINERALS)  tablet Take 1 tablet by mouth daily.      [provider]  neomycin-polymyxin-gramicidin (NEOSPORIN) 1.75-10000-.025 ophthalmic solution Place 1-2 drops into the right eye 4 (four) times daily.    [provider]  pantoprazole (PROTONIX) 40 MG tablet Take 40 mg by mouth 2 (two) times daily. 10/05/20   [provider]  RABEprazole (ACIPHEX) 20 MG tablet Take by mouth. 09/26/16   [provider]  sucralfate (CARAFATE) 1 g tablet Take 1 tablet (1 g total) by mouth 4 (four) times daily -  with meals and at bedtime. 08/29/21   Arby Barrette, MD  VITAMIN D PO Take by mouth.    [provider]  Vitamin D, Ergocalciferol, (DRISDOL) 50000 units CAPS capsule Take 50,000 Units by mouth every 7 (seven) days.    [provider]  omeprazole (PRILOSEC) 40 MG capsule Take 40 mg by mouth daily.    12/21/11  [provider]      Allergies    Adhesive [tape], Demerol [meperidine hcl], Meperidine hcl, Percocet [oxycodone-acetaminophen], and Septra [bactrim]    Review of Systems   Review of Systems  Constitutional:  Negative for chills, fatigue and fever.  HENT:  Positive for rhinorrhea and sinus pressure.   Respiratory:  Negative for cough, shortness of breath, wheezing and stridor.   Cardiovascular:  Negative for chest pain and palpitations.  Gastrointestinal:  Negative for abdominal pain, diarrhea, nausea and vomiting.  Musculoskeletal:  Negative for arthralgias.  Neurological:  Positive for headaches. Negative for seizures, syncope, light-headedness and numbness.  Psychiatric/Behavioral:  Negative for confusion.     Physical Exam Updated Vital Signs BP (!) 165/139 (BP Location: Left Arm)   Pulse 87   Temp 97.7 F (36.5 C)   Resp 20   Ht 5\' 2"  (1.575 m)   Wt 57.2 kg   SpO2 100%   BMI 23.05 kg/m  Physical Exam Constitutional:      Appearance: Normal appearance.  HENT:     Head: Normocephalic and atraumatic.     Mouth/Throat:      Mouth: Mucous membranes are dry.  Eyes:     Pupils: Pupils are equal, round, and reactive to light.  Cardiovascular:     Rate and Rhythm: Normal rate and regular rhythm.     Pulses: Normal pulses.     Heart sounds: Normal heart sounds.  Pulmonary:     Effort: Pulmonary effort is normal.     Breath sounds: Normal breath sounds.  Abdominal:     General: Abdomen is flat. Bowel sounds are normal. There is no distension.     Palpations: Abdomen is soft.     Tenderness: There is no abdominal tenderness.  Skin:    General: Skin is warm and dry.     Capillary Refill: Capillary refill takes less than 2 seconds.  Neurological:  General: No focal deficit present.     Mental Status: She is alert and oriented to person, place, and time.     ED Results / Procedures / Treatments   Labs (all labs ordered are listed, but only abnormal results are displayed) Labs Reviewed  CBC WITH DIFFERENTIAL/PLATELET - Abnormal; Notable for the following components:      Result Value   RDW 17.1 (*)    All other components within normal limits  BASIC METABOLIC PANEL - Abnormal; Notable for the following components:   Glucose, Bld 105 (*)    All other components within normal limits    EKG None  Radiology No results found.  Procedures Procedures    Medications Ordered in ED Medications  acetaminophen (TYLENOL) tablet 1,000 mg (1,000 mg Oral Given 06/07/23 2242)    ED Course/ Medical Decision Making/ A&P Clinical Course as of 06/07/23 2312  Wed Jun 07, 2023  2222 Stable (resident) Evaluated personally at bedside.  This is a 57 year old female presenting with a headache since 3 PM today.  It is mild in nature and involves the top of her head bitemporal.  She had a headache similar last week.  Called urgent care told her was because of her hypertension told her to come in for further care and management.  She has a longstanding history of hypertension used to be on 3 different medications but  would lifestyle changes was able to be de-escalated to 1.  She has been compliant with that medication but had had hypertensive readings at home.  Was told to schedule appoint with her PCP which is scheduled for tomorrow morning. Headache is without any focal neurologic symptoms.  She is otherwise ambulatory tolerating p.o. intake in no acute distress. She denies fevers chills nausea vomiting syncope shortness of breath. Intracranial hemorrhage is considered grossly less likely based on this presentation [CC]    Clinical Course User Index [CC] Glyn Ade, MD                                 Medical Decision Making Pt presenting with headache for several hours, moderate pain relieved by Tylenol. She is coming over sinusitis and was treated with a course of Augmentin. CBC and BMP are unremarkable. Not concerned for resistent/reinfection given stable vitals, labs, exam. She is hypertensive. She was on a more intense treatment regimen in the past that was scaled back due to successful lifestyle changes. She has an appointment with her PCP tomorrow. Not hypertensive emergency, will defer to PCP. Tylenol provided for headache. HTN may be contributing to this as well as her sinus pressure. No indication for cranial imaging at this time, AO3 without deficit. She is stable for home discharge and will visit her PCP tomorrow.  Amount and/or Complexity of Data Reviewed Labs: ordered.    Details: CBC and BMP WNL. No leukocytosis, electrolyte abnormalities, anemia.  Risk OTC drugs.         Final Clinical Impression(s) / ED Diagnoses Final diagnoses:  Primary hypertension  Nonintractable headache, unspecified chronicity pattern, unspecified headache type    Rx / DC Orders ED Discharge Orders     None         Katheran James, DO 06/07/23 2312    Glyn Ade, MD 06/08/23 1712

## 2023-06-08 ENCOUNTER — Emergency Department (HOSPITAL_BASED_OUTPATIENT_CLINIC_OR_DEPARTMENT_OTHER): Payer: BC Managed Care – PPO

## 2023-06-08 ENCOUNTER — Encounter (HOSPITAL_BASED_OUTPATIENT_CLINIC_OR_DEPARTMENT_OTHER): Payer: Self-pay | Admitting: Urology

## 2023-06-08 ENCOUNTER — Emergency Department (HOSPITAL_BASED_OUTPATIENT_CLINIC_OR_DEPARTMENT_OTHER)
Admission: EM | Admit: 2023-06-08 | Discharge: 2023-06-08 | Disposition: A | Discharge status: Home / Self Care | Payer: BC Managed Care – PPO | Attending: Emergency Medicine | Admitting: Emergency Medicine

## 2023-06-08 DIAGNOSIS — Z79899 Other long term (current) drug therapy: Secondary | ICD-10-CM | POA: Diagnosis not present

## 2023-06-08 DIAGNOSIS — R519 Headache, unspecified: Secondary | ICD-10-CM | POA: Diagnosis present

## 2023-06-08 DIAGNOSIS — I10 Essential (primary) hypertension: Secondary | ICD-10-CM | POA: Insufficient documentation

## 2023-06-08 NOTE — ED Provider Notes (Signed)
[place] [facility] Provider Note   CSN: [id number] Arrival date & time: 06/08/23  1428     History Chief Complaint  Patient presents with   Headache    Natasha Hickman is a 57 y.o. female. Patient with past history significant for hypertension, headache, and recent sinus infection.  Was seen in the emergency department yesterday for similar concerns and reports that symptoms have not improved.  Was seen by primary care provider today was advised to return to the emergency department for CT scanning of the head.  States that she is having some nausea but denies any photosensitivity or sensitivity.  Given Toradol IM by primary care provider prior to arrival without improvement in symptoms.  No prior history of chronic migraine headaches.   Headache      Home Medications Prior to Admission medications   Medication Sig Start Date End Date Taking? Authorizing Provider  ALPRAZolam (XANAX) 0.25 MG tablet Take 0.125 mg by mouth once as needed. For anxiety    [provider]  amLODipine-atorvastatin (CADUET) 5-20 MG tablet Take by mouth. 11/06/20   [provider]  amLODipine-olmesartan (AZOR) 5-20 MG tablet Take 1 tablet by mouth daily.      [provider]  Ascorbic Acid (VITAMIN C PO) Take by mouth.    [provider]  atenolol (TENORMIN) 25 MG tablet Take 25 mg by mouth daily. 10/14/20   [provider]  BLACK CURRANT SEED OIL PO Take by mouth.    [provider]  clonazePAM (KLONOPIN) 0.5 MG tablet Take 0.5 mg by mouth 2 (two) times daily as needed.    [provider]  cyclobenzaprine (FLEXERIL) 10 MG tablet Take 1 tablet (10 mg total) by mouth 2 (two) times daily as needed for muscle spasms. 11/06/20   Renne Crigler, PA-C  cyclobenzaprine (FLEXERIL) 10 MG tablet Take 1 tablet (10 mg total) by mouth 2 (two) times daily as needed for muscle spasms. 04/13/21   Tegeler, Canary Brim, MD   dicyclomine (BENTYL) 10 MG capsule Take 10 mg by mouth 4 (four) times daily -  before meals and at bedtime.    [provider]  DOCOSAHEXAENOIC ACID PO Take 1 g by mouth.    [provider]  esomeprazole (NEXIUM) 40 MG capsule Take 40 mg by mouth daily before breakfast.    [provider]  famotidine (PEPCID) 20 MG tablet Take 1 tablet (20 mg total) by mouth 2 (two) times daily. 08/29/21   Arby Barrette, MD  famotidine (PEPCID) 40 MG tablet Take 40 mg by mouth daily.    [provider]  ferrous sulfate 325 (65 FE) MG tablet Take 325 mg by mouth 2 (two) times daily with a meal.    [provider]  fish oil-omega-3 fatty acids 1000 MG capsule Take 1 g by mouth 3 (three) times daily.    [provider]  Flaxseed, Linseed, (FLAXSEED OIL) 1000 MG CAPS Take 1 capsule by mouth 2 (two) times daily.    [provider]  fluticasone (FLONASE) 50 MCG/ACT nasal spray 1-2 SPRAY, NASAL, IN EACH NOSTRIL DAILY 04/17/15   [provider]  gabapentin (NEURONTIN) 100 MG capsule Take 100 mg by mouth 3 (three) times daily.    [provider]  Garlic 1000 MG CAPS Take 1 capsule by mouth daily.    [provider]  meloxicam (MOBIC) 7.5 MG tablet Take 1 tablet (7.5 mg total) by mouth daily. 11/06/20  Renne Crigler, PA-C  methocarbamol (ROBAXIN) 500 MG tablet Take 1 tablet (500 mg total) by mouth every 6 (six) hours as needed for muscle spasms. 08/29/21   Arby Barrette, MD  Multiple Vitamins-Minerals (MULTIVITAMIN WITH MINERALS) tablet Take 1 tablet by mouth daily.      [provider]  neomycin-polymyxin-gramicidin (NEOSPORIN) 1.75-10000-.025 ophthalmic solution Place 1-2 drops into the right eye 4 (four) times daily.    [provider]  pantoprazole (PROTONIX) 40 MG tablet Take 40 mg by mouth 2 (two) times daily. 10/05/20   [provider]  RABEprazole (ACIPHEX) 20 MG tablet Take by mouth. 09/26/16    [provider]  sucralfate (CARAFATE) 1 g tablet Take 1 tablet (1 g total) by mouth 4 (four) times daily -  with meals and at bedtime. 08/29/21   Arby Barrette, MD  VITAMIN D PO Take by mouth.    [provider]  Vitamin D, Ergocalciferol, (DRISDOL) 50000 units CAPS capsule Take 50,000 Units by mouth every 7 (seven) days.    [provider]  omeprazole (PRILOSEC) 40 MG capsule Take 40 mg by mouth daily.    12/21/11  [provider]      Allergies    Adhesive [tape], Demerol [meperidine hcl], Meperidine hcl, Percocet [oxycodone-acetaminophen], and Septra [bactrim]    Review of Systems   Review of Systems  Neurological:  Positive for headaches.  All other systems reviewed and are negative.   Physical Exam Updated Vital Signs BP (!) 175/110   Pulse 81   Temp 98.1 F (36.7 C) (Oral)   Resp 16   Ht 5\' 2"  (1.575 m)   Wt 57 kg   SpO2 100%   BMI 22.98 kg/m  Physical Exam Vitals and nursing note reviewed.  Constitutional:      General: She is not in acute distress.    Appearance: She is well-developed.  HENT:     Head: Normocephalic and atraumatic.  Eyes:     General: No visual field deficit.    Conjunctiva/sclera: Conjunctivae normal.  Cardiovascular:     Rate and Rhythm: Normal rate and regular rhythm.     Heart sounds: No murmur heard. Pulmonary:     Effort: Pulmonary effort is normal. No respiratory distress.     Breath sounds: Normal breath sounds.  Abdominal:     Palpations: Abdomen is soft.     Tenderness: There is no abdominal tenderness.  Musculoskeletal:        General: No swelling.     Cervical back: Neck supple.  Skin:    General: Skin is warm and dry.     Capillary Refill: Capillary refill takes less than 2 seconds.  Neurological:     Mental Status: She is alert.     Cranial Nerves: No cranial nerve deficit or facial asymmetry.     Sensory: No sensory deficit.  Psychiatric:        Mood and Affect: Mood normal.      ED Results / Procedures / Treatments   Labs (all labs ordered are listed, but only abnormal results are displayed) Labs Reviewed - No data to display  EKG None  Radiology CT Head Wo Contrast  Result Date: 06/08/2023 CLINICAL DATA:  Headache EXAM: CT HEAD WITHOUT CONTRAST TECHNIQUE: Contiguous axial images were obtained from the base of the skull through the vertex without intravenous contrast. RADIATION DOSE REDUCTION: This exam was performed according to the departmental dose-optimization program which includes automated exposure control, adjustment of the mA and/or  kV according to patient size and/or use of iterative reconstruction technique. COMPARISON:  CT brain 03/07/2013 FINDINGS: Brain: No acute territorial infarction, hemorrhage, or intracranial mass. The ventricles are nonenlarged. Vascular: No hyperdense vessels.  No unexpected calcification Skull: Normal. Negative for fracture or focal lesion. Sinuses/Orbits: Mild mucosal thickening in the ethmoid sinuses Other: None IMPRESSION: Negative non contrasted CT appearance of the brain. Electronically Signed   By: Jasmine Pang M.D.   On: 06/08/2023 16:11    Procedures Procedures   Medications Ordered in ED Medications - No data to display  ED Course/ Medical Decision Making/ A&P                               Medical Decision Making Amount and/or Complexity of Data Reviewed Radiology: ordered.   This patient presents to the ED for concern of headache. Differential diagnosis includes migraine headache, tension headache, meningitis, SAH, viral URI    Additional history obtained:  Additional history obtained from recent ED visit on 05/22/2023 and 06/07/2023 External records from outside source obtained and reviewed including treated for bacterial sinusitis and notably hypertensive on visit yesterday.  No prior imaging.   Lab Tests:  I Ordered, and personally interpreted labs.  The pertinent results include: Labs from ED  visit yesterday including CBC and BMP are unremarkable   Imaging Studies ordered:  I ordered imaging studies including CT head  I independently visualized and interpreted imaging which showed no evidence of any acute intracranial abnormality I agree with the radiologist interpretation   Problem List / ED Course:  Patient with past history significant for hypertension, tobacco use, GERD, headache presents the emergency department concerns of a headache.  He was seen in the emergency department yesterday for similar concerns with no acute findings noted.  No migraine medications were administered.  Patient seen by primary care provider today and advised to return the emergency department for CT imaging of the head given persistent symptoms over the last 2 weeks or so.  I previously been treated for a suspected sinus infection with antibiotics and reported some mild improvement in symptoms but states that the headache is back and persistent.  Denies any photosensitivity, nausea, vomiting, abdominal pain.  Given that labs were ordered yesterday, no acute indication and repeat labs evaluation.  CT imaging ordered. CT head negative.  Informed patient of reassuring findings on CT imaging.  Discussed treating patient with IV migraine cocktail including Compazine Benadryl patient instead would opt for over-the-counter treatment without home medications.  As stated previously, no acute indication for repeat lab or other further evaluation at this point.  Encourage patient to follow-up with primary care provider outpatient for further evaluation of symptoms or not improving.  Strict return precautions discussed.  All questions answered prior to discharge.  Final Clinical Impression(s) / ED Diagnoses Final diagnoses:  Bad headache    Rx / DC Orders ED Discharge Orders     None         Smitty Knudsen, PA-C 06/08/23 1703    Linwood Dibbles, MD 06/10/23 [phone]

## 2023-06-08 NOTE — ED Triage Notes (Signed)
Pt seen last night for headache, states headache not any better, states pcp sent for CT  States nausea as well, denies photosensitivity  Toradol IM given by pcp PTA

## 2023-06-08 NOTE — Discharge Instructions (Signed)
You were seen in the emergency department today for a headache.  Appears that this has been a headache that has been difficult to clear over the last few weeks.  He had CT imaging of your head which was thankfully reassuring with no abnormalities noted.  We discussed the possibility of starting IV medications to address the migraine headache they have been experiencing but you opted instead for treatment at home with over-the-counter options.  Please, following up with your primary care provider for evaluation of symptoms if they are not improving.  If you feel your symptoms are acutely worsening or are declining, please return the emergency department.

## 2023-08-25 ENCOUNTER — Encounter (HOSPITAL_BASED_OUTPATIENT_CLINIC_OR_DEPARTMENT_OTHER): Payer: Self-pay

## 2023-08-25 ENCOUNTER — Other Ambulatory Visit: Payer: Self-pay

## 2023-08-25 ENCOUNTER — Emergency Department (HOSPITAL_BASED_OUTPATIENT_CLINIC_OR_DEPARTMENT_OTHER)
Admission: EM | Admit: 2023-08-25 | Discharge: 2023-08-25 | Disposition: A | Discharge status: Home / Self Care | Payer: BC Managed Care – PPO | Attending: Emergency Medicine | Admitting: Emergency Medicine

## 2023-08-25 ENCOUNTER — Emergency Department (HOSPITAL_BASED_OUTPATIENT_CLINIC_OR_DEPARTMENT_OTHER): Payer: BC Managed Care – PPO

## 2023-08-25 DIAGNOSIS — R109 Unspecified abdominal pain: Secondary | ICD-10-CM | POA: Diagnosis not present

## 2023-08-25 DIAGNOSIS — R519 Headache, unspecified: Secondary | ICD-10-CM | POA: Diagnosis present

## 2023-08-25 DIAGNOSIS — Z79899 Other long term (current) drug therapy: Secondary | ICD-10-CM | POA: Insufficient documentation

## 2023-08-25 DIAGNOSIS — I10 Essential (primary) hypertension: Secondary | ICD-10-CM | POA: Insufficient documentation

## 2023-08-25 LAB — CBC WITH DIFFERENTIAL/PLATELET
Abs Immature Granulocytes: 0.01 10*3/uL (ref 0.00–0.07)
Basophils Absolute: 0 10*3/uL (ref 0.0–0.1)
Basophils Relative: 0 %
Eosinophils Absolute: 0.1 10*3/uL (ref 0.0–0.5)
Eosinophils Relative: 1 %
HCT: 39.2 % (ref 36.0–46.0)
Hemoglobin: 12.7 g/dL (ref 12.0–15.0)
Immature Granulocytes: 0 %
Lymphocytes Relative: 46 %
Lymphs Abs: 2.2 10*3/uL (ref 0.7–4.0)
MCH: 28.2 pg (ref 26.0–34.0)
MCHC: 32.4 g/dL (ref 30.0–36.0)
MCV: 86.9 fL (ref 80.0–100.0)
Monocytes Absolute: 0.2 10*3/uL (ref 0.1–1.0)
Monocytes Relative: 5 %
Neutro Abs: 2.2 10*3/uL (ref 1.7–7.7)
Neutrophils Relative %: 48 %
Platelets: 212 10*3/uL (ref 150–400)
RBC: 4.51 MIL/uL (ref 3.87–5.11)
RDW: 15.7 % — ABNORMAL HIGH (ref 11.5–15.5)
WBC: 4.7 10*3/uL (ref 4.0–10.5)
nRBC: 0 % (ref 0.0–0.2)

## 2023-08-25 LAB — BASIC METABOLIC PANEL
Anion gap: 12 (ref 5–15)
BUN: 11 mg/dL (ref 6–20)
CO2: 25 mmol/L (ref 22–32)
Calcium: 9.5 mg/dL (ref 8.9–10.3)
Chloride: 100 mmol/L (ref 98–111)
Creatinine, Ser: 0.75 mg/dL (ref 0.44–1.00)
GFR, Estimated: >60 mL/min (ref >60)
Glucose, Bld: 147 mg/dL — ABNORMAL HIGH (ref 70–99)
Potassium: 3.4 mmol/L — ABNORMAL LOW (ref 3.5–5.1)
Sodium: 137 mmol/L (ref 135–145)

## 2023-08-25 MED ORDER — DIPHENHYDRAMINE HCL 25 MG PO CAPS
25.0000 mg | ORAL_CAPSULE | Freq: Once | ORAL | Status: AC
  Administered 2023-08-25: 25 mg via ORAL
  Filled 2023-08-25: qty 1

## 2023-08-25 MED ORDER — PROCHLORPERAZINE MALEATE 10 MG PO TABS
10.0000 mg | ORAL_TABLET | Freq: Once | ORAL | Status: AC
  Administered 2023-08-25: 10 mg via ORAL
  Filled 2023-08-25: qty 1

## 2023-08-25 NOTE — Discharge Instructions (Signed)
We saw you in the ER for headaches. All the labs and imaging are normal. We are not sure what is causing your headaches, however, there appears to be no evidence of infection, bleeds or tumors based on our exam and results.  Please take tylenol round the clock for the next 6 hours, remain hydrated, and take other meds prescribed only for break through pain. See your doctor if the pain persists, as you might need better medications or a specialist.

## 2023-08-25 NOTE — ED Provider Notes (Signed)
[place] [facility] Provider Note   CSN: [id number] Arrival date & time: 08/25/23  1344     History  Chief Complaint  Patient presents with   Headache   Abdominal Pain    Natasha Hickman is a 57 y.o. female history of Barrett's esophagus, IBS, hypertension, GERD presented with a headache for the past 3 days.  Patient states that the headache began after receiving a steroid injection for her Barrett's esophagus and that the headache is in her frontal sinuses and feel like a sinus headache.  Patient denies any change in sensation, new onset weakness, fevers, neck pain, gait abnormalities, chest pain, shortness of breath.  Patient states she took Tylenol at home which did seem to help.  Headache is described as dull and persistent.  Patient has headache being sudden and maximal at onset.  Headache is on bilateral sides of her temples.  Patient denies jaw claudication.  Patient states that these headaches do feel like similar headaches in the past.  Home Medications Prior to Admission medications   Medication Sig Start Date End Date Taking? Authorizing Provider  ALPRAZolam (XANAX) 0.25 MG tablet Take 0.125 mg by mouth once as needed. For anxiety    [provider]  amLODipine-atorvastatin (CADUET) 5-20 MG tablet Take by mouth. 11/06/20   [provider]  amLODipine-olmesartan (AZOR) 5-20 MG tablet Take 1 tablet by mouth daily.      [provider]  Ascorbic Acid (VITAMIN C PO) Take by mouth.    [provider]  atenolol (TENORMIN) 25 MG tablet Take 25 mg by mouth daily. 10/14/20   [provider]  BLACK CURRANT SEED OIL PO Take by mouth.    [provider]  clonazePAM (KLONOPIN) 0.5 MG tablet Take 0.5 mg by mouth 2 (two) times daily as needed.    [provider]  cyclobenzaprine (FLEXERIL) 10 MG tablet Take 1 tablet (10 mg total) by mouth 2 (two) times daily as needed for muscle spasms. 11/06/20    Renne Crigler, PA-C  cyclobenzaprine (FLEXERIL) 10 MG tablet Take 1 tablet (10 mg total) by mouth 2 (two) times daily as needed for muscle spasms. 04/13/21   Tegeler, Canary Brim, MD  dicyclomine (BENTYL) 10 MG capsule Take 10 mg by mouth 4 (four) times daily -  before meals and at bedtime.    [provider]  DOCOSAHEXAENOIC ACID PO Take 1 g by mouth.    [provider]  esomeprazole (NEXIUM) 40 MG capsule Take 40 mg by mouth daily before breakfast.    [provider]  famotidine (PEPCID) 20 MG tablet Take 1 tablet (20 mg total) by mouth 2 (two) times daily. 08/29/21   Arby Barrette, MD  famotidine (PEPCID) 40 MG tablet Take 40 mg by mouth daily.    [provider]  ferrous sulfate 325 (65 FE) MG tablet Take 325 mg by mouth 2 (two) times daily with a meal.    [provider]  fish oil-omega-3 fatty acids 1000 MG capsule Take 1 g by mouth 3 (three) times daily.    [provider]  Flaxseed, Linseed, (FLAXSEED OIL) 1000 MG CAPS Take 1 capsule by mouth 2 (two) times daily.    [provider]  fluticasone (FLONASE) 50 MCG/ACT nasal spray 1-2 SPRAY, NASAL, IN EACH NOSTRIL DAILY 04/17/15   [provider]  gabapentin (NEURONTIN) 100 MG capsule Take 100 mg by mouth 3 (three) times daily.    [provider]  Garlic 1000 MG CAPS Take 1 capsule by mouth daily.    [provider]  meloxicam (MOBIC) 7.5 MG tablet Take 1 tablet (7.5 mg total) by mouth daily. 11/06/20   Renne Crigler, PA-C  methocarbamol (ROBAXIN) 500 MG tablet Take 1 tablet (500 mg total) by mouth every 6 (six) hours as needed for muscle spasms. 08/29/21   Arby Barrette, MD  Multiple Vitamins-Minerals (MULTIVITAMIN WITH MINERALS) tablet Take 1 tablet by mouth daily.      [provider]  neomycin-polymyxin-gramicidin (NEOSPORIN) 1.75-10000-.025 ophthalmic solution Place 1-2 drops into the right eye 4 (four) times daily.    [provider]  pantoprazole (PROTONIX) 40 MG tablet Take 40 mg by mouth 2 (two) times daily. 10/05/20   [provider]  RABEprazole (ACIPHEX) 20 MG tablet Take by mouth. 09/26/16   [provider]  sucralfate (CARAFATE) 1 g tablet Take 1 tablet (1 g total) by mouth 4 (four) times daily -  with meals and at bedtime. 08/29/21   Arby Barrette, MD  VITAMIN D PO Take by mouth.    [provider]  Vitamin D, Ergocalciferol, (DRISDOL) 50000 units CAPS capsule Take 50,000 Units by mouth every 7 (seven) days.    [provider]  omeprazole (PRILOSEC) 40 MG capsule Take 40 mg by mouth daily.    12/21/11  [provider]      Allergies    Adhesive [tape], Demerol [meperidine hcl], Meperidine hcl, Percocet [oxycodone-acetaminophen], and Septra [bactrim]    Review of Systems   Review of Systems  Gastrointestinal:  Positive for abdominal pain.  Neurological:  Positive for headaches.    Physical Exam Updated Vital Signs BP (!) 153/102   Pulse 86   Temp 98.8 F (37.1 C) (Oral)   Resp 18   Ht 5\' 2"  (1.575 m)   Wt 57.6 kg   SpO2 98%   BMI 23.23 kg/m  Physical Exam Constitutional:      Appearance: She is normal weight.  HENT:     Head: Normocephalic.     Comments: No temporal artery tenderness No jaw claudication Eyes:     Extraocular Movements: Extraocular movements intact.     Conjunctiva/sclera: Conjunctivae normal.     Pupils: Pupils are equal, round, and reactive to light.  Cardiovascular:     Rate and Rhythm: Normal rate and regular rhythm.     Pulses: Normal pulses.     Heart sounds: Normal heart sounds.  Pulmonary:     Effort: Pulmonary effort is normal.     Breath sounds: Normal breath sounds.  Abdominal:     Palpations: Abdomen is soft.     Tenderness: There is no abdominal tenderness. There is no guarding or rebound.  Musculoskeletal:        General: Normal range of motion.     Cervical back: Normal range of motion. No  rigidity or tenderness.  Skin:    General: Skin is warm and dry.     Capillary Refill: Capillary refill takes less than 2 seconds.  Neurological:     General: No focal deficit present.     Mental Status: She is alert.     Sensory: Sensation is intact.     Motor: Motor function is intact.     Coordination: Coordination is intact.     Gait: Gait is intact.     Comments: Cranial nerves III through XII intact Vision grossly intact Sensation tact in all 4 extremities Able to ambulate without  difficulty  Psychiatric:        Mood and Affect: Mood normal.     ED Results / Procedures / Treatments   Labs (all labs ordered are listed, but only abnormal results are displayed) Labs Reviewed  BASIC METABOLIC PANEL - Abnormal; Notable for the following components:      Result Value   Potassium 3.4 (*)    Glucose, Bld 147 (*)    All other components within normal limits  CBC WITH DIFFERENTIAL/PLATELET - Abnormal; Notable for the following components:   RDW 15.7 (*)    All other components within normal limits    EKG None  Radiology CT Head Wo Contrast  Result Date: 08/25/2023 CLINICAL DATA:  Headache EXAM: CT HEAD WITHOUT CONTRAST TECHNIQUE: Contiguous axial images were obtained from the base of the skull through the vertex without intravenous contrast. RADIATION DOSE REDUCTION: This exam was performed according to the departmental dose-optimization program which includes automated exposure control, adjustment of the mA and/or kV according to patient size and/or use of iterative reconstruction technique. COMPARISON:  CT head 06/08/2023 FINDINGS: Brain: No evidence of acute infarction, hemorrhage, hydrocephalus, extra-axial collection or mass lesion/mass effect. Vascular: Atherosclerotic calcifications are present within the cavernous internal carotid arteries. Skull: Normal. Negative for fracture or focal lesion. Sinuses/Orbits: No acute finding. Other: None. IMPRESSION: No acute intracranial  abnormality. Electronically Signed   By: Darliss Cheney M.D.   On: 08/25/2023 17:46    Procedures Procedures    Medications Ordered in ED Medications  prochlorperazine (COMPAZINE) tablet 10 mg (10 mg Oral Given 08/25/23 1514)  diphenhydrAMINE (BENADRYL) capsule 25 mg (25 mg Oral Given 08/25/23 1514)    ED Course/ Medical Decision Making/ A&P                                 Medical Decision Making Amount and/or Complexity of Data Reviewed Labs: ordered. Radiology: ordered.  Risk Prescription drug management.   Natasha Hickman 57 y.o. presented today for HA. Working DDx that I considered at this time includes, but not limited to, tension headache, migraine, intracranial mass, intracranial hemorrhage, intracranial infection including meningitis vs encephalitis, GCA, trigeminal neuralgia, preeclampsia/eclampsia, AVM, sinusitis, cerebral aneurysm, muscular headache, cavernous sinus thrombosis, carotid artery dissection.  R/o DDx: intracranial mass, intracranial hemorrhage, intracranial infection including meningitis vs encephalitis, GCA, trigeminal neuralgia, preeclampsia/eclampsia, AVM, sinusitis, cerebral aneurysm, muscular headache, cavernous sinus thrombosis, carotid artery dissection:  less likely due to history of present illness, physical exam, labs/imaging findings  Review of prior external notes: 08/21/2023 procedure visit  Unique Tests and My Interpretation:  CBC: Unremarkable CMP: Unremarkable CT Head w/o Contrast: Unremarkable  Social Determinants of Health: none  Discussion with Independent Historian: None  Discussion of Management of Tests: None  Risk: Medium: prescription drug management  Risk Stratification Score: none  Plan: On exam patient was in no acute distress stable vitals. Physical exam was unremarkable as patient had reassuring physical exam along with neurologic exam. Presentation is like pts typical HA and non concerning for [facility], ICH, Meningitis,  temporal arteritis, or other life-threatening headaches. Pt is afebrile with no focal neuro deficits, nuchal rigidity, or change in vision. Labs and imaging will be ordered. Patient will be given Compazine and Benadryl for HA treatment. Patient stable at this time.  On recheck said that the headache was feeling much better after the medication.  Still waiting labs and imaging however do suspect that patient will be  able to be discharged with primary care follow-up and encouraged Tylenol every 6 hours as needed for pain.  Triage note states that patient is having abdominal pain however patient denied this with me and so will not workup patient for this.  CT scan was negative.  Patient's been in the ER for 4 hours and at this point is had stable vitals and has remained stable and states that her headache has resolved.  Patient headache treated and improved while in ED. Patient is to follow up with PCP to discuss prophylactic medication.  Encourage patient to use Tylenol every 6 hours as needed for pain for her headache.  Patient was given return precautions. Patient stable for discharge at this time.  Patient verbalized understanding of plan.  This chart was dictated using voice recognition software.  Despite best efforts to proofread,  errors can occur which can change the documentation meaning.         Final Clinical Impression(s) / ED Diagnoses Final diagnoses:  Bad headache    Rx / DC Orders ED Discharge Orders     None         Remi Deter 08/25/23 1755    Jacalyn Lefevre, MD 08/25/23 1825

## 2023-08-25 NOTE — ED Notes (Signed)
Alert, NAD, calm, interactive, resps e/u, speaking in clear complete sentences. Denies needs at this time. Updated.

## 2023-08-25 NOTE — ED Triage Notes (Addendum)
The patient is having a headache, hot flashes for three days.She is also having abd pain.

## 2023-08-25 NOTE — ED Notes (Signed)
Reviewed discharge instructions with pt  pt states understanding Ambulatory at discharge

## 2024-03-06 ENCOUNTER — Emergency Department (HOSPITAL_BASED_OUTPATIENT_CLINIC_OR_DEPARTMENT_OTHER)
Admission: EM | Admit: 2024-03-06 | Discharge: 2024-03-06 | Disposition: A | Discharge status: Home / Self Care | Attending: Emergency Medicine | Admitting: Emergency Medicine

## 2024-03-06 ENCOUNTER — Emergency Department (HOSPITAL_BASED_OUTPATIENT_CLINIC_OR_DEPARTMENT_OTHER)

## 2024-03-06 ENCOUNTER — Other Ambulatory Visit: Payer: Self-pay

## 2024-03-06 ENCOUNTER — Encounter (HOSPITAL_BASED_OUTPATIENT_CLINIC_OR_DEPARTMENT_OTHER): Payer: Self-pay | Admitting: Emergency Medicine

## 2024-03-06 DIAGNOSIS — R519 Headache, unspecified: Secondary | ICD-10-CM | POA: Diagnosis present

## 2024-03-06 DIAGNOSIS — Z79899 Other long term (current) drug therapy: Secondary | ICD-10-CM | POA: Insufficient documentation

## 2024-03-06 DIAGNOSIS — I10 Essential (primary) hypertension: Secondary | ICD-10-CM | POA: Diagnosis not present

## 2024-03-06 LAB — BASIC METABOLIC PANEL WITH GFR
Anion gap: 14 (ref 5–15)
BUN: 14 mg/dL (ref 6–20)
CO2: 24 mmol/L (ref 22–32)
Calcium: 10 mg/dL (ref 8.9–10.3)
Chloride: 99 mmol/L (ref 98–111)
Creatinine, Ser: 0.74 mg/dL (ref 0.44–1.00)
GFR, Estimated: >60 mL/min (ref >60)
Glucose, Bld: 122 mg/dL — ABNORMAL HIGH (ref 70–99)
Potassium: 3.5 mmol/L (ref 3.5–5.1)
Sodium: 138 mmol/L (ref 135–145)

## 2024-03-06 LAB — CBC WITH DIFFERENTIAL/PLATELET
Abs Immature Granulocytes: 0.02 10*3/uL (ref 0.00–0.07)
Basophils Absolute: 0 10*3/uL (ref 0.0–0.1)
Basophils Relative: 0 %
Eosinophils Absolute: 0.1 10*3/uL (ref 0.0–0.5)
Eosinophils Relative: 2 %
HCT: 36.6 % (ref 36.0–46.0)
Hemoglobin: 12.2 g/dL (ref 12.0–15.0)
Immature Granulocytes: 1 %
Lymphocytes Relative: 50 %
Lymphs Abs: 2.2 10*3/uL (ref 0.7–4.0)
MCH: 29.3 pg (ref 26.0–34.0)
MCHC: 33.3 g/dL (ref 30.0–36.0)
MCV: 87.8 fL (ref 80.0–100.0)
Monocytes Absolute: 0.3 10*3/uL (ref 0.1–1.0)
Monocytes Relative: 7 %
Neutro Abs: 1.7 10*3/uL (ref 1.7–7.7)
Neutrophils Relative %: 40 %
Platelets: 175 10*3/uL (ref 150–400)
RBC: 4.17 MIL/uL (ref 3.87–5.11)
RDW: 13.1 % (ref 11.5–15.5)
WBC: 4.4 10*3/uL (ref 4.0–10.5)
nRBC: 0 % (ref 0.0–0.2)

## 2024-03-06 MED ORDER — KETOROLAC TROMETHAMINE 15 MG/ML IJ SOLN
15.0000 mg | Freq: Once | INTRAMUSCULAR | Status: AC
  Administered 2024-03-06: 15 mg via INTRAMUSCULAR
  Filled 2024-03-06: qty 1

## 2024-03-06 NOTE — ED Notes (Signed)
 EDP Tee made aware of pt, order received

## 2024-03-06 NOTE — Discharge Instructions (Addendum)
 You were seen today for headache and tingling across scalp and lips.  This is likely tension headache combined with anxiety with your labs and imaging being very reassuring at this time.  Your physical exam was also reassuring I have low suspicion for any emergent causes of your symptoms today.  Recommend you continue to take Tylenol  ibuprofen  for headache as well as using heating pads over your shoulders.  Return to the ED for any new or worsening symptoms which include headache with visual changes, vomiting, increased pain uncontrolled with medications, one-sided weakness, confusion.  If you continue to have persistent symptoms despite treatment, recommend you follow-up with PCP for further evaluation.

## 2024-03-06 NOTE — ED Provider Notes (Signed)
 [place] [facility] Provider Note   CSN: [id number] Arrival date & time: 03/06/24  1419     History  Chief Complaint  Patient presents with   Tingling    Natasha Hickman is a 58 y.o. female.  HPI Patient is a 58 year old female's been seen today with complaints of tingling sensation on the top of her head, lips that she noticed today while she was at work approximately 1 hour ago.  Notes that she has previously felt this way in the past but it has gone away, noted to be during times of anxiety.  States that she does also have a mild headache that feels like a "band going around head."  Also notes that she has had some mild rhinorrhea. Notes chronic abdominal pain from her ureter bowel syndrome, unchanged from baseline.  Also notes that she has been more anxious recently with her aunt passing away this past Monday.  Denies trauma, vision changes, hearing loss, vertigo, blurry vision, cough, chest pain, shortness of breath, nausea, vomiting, hematochezia, melena, dysuria, hematuria, vaginal discharge, lower leg swelling.    Home Medications Prior to Admission medications   Medication Sig Start Date End Date Taking? Authorizing Provider  ALPRAZolam (XANAX) 0.25 MG tablet Take 0.125 mg by mouth once as needed. For anxiety    [provider]  amLODipine-atorvastatin (CADUET) 5-20 MG tablet Take by mouth. 11/06/20   [provider]  amLODipine-olmesartan (AZOR) 5-20 MG tablet Take 1 tablet by mouth daily.      [provider]  Ascorbic Acid (VITAMIN C PO) Take by mouth.    [provider]  atenolol (TENORMIN) 25 MG tablet Take 25 mg by mouth daily. 10/14/20   [provider]  BLACK CURRANT SEED OIL PO Take by mouth.    [provider]  clonazePAM (KLONOPIN) 0.5 MG tablet Take 0.5 mg by mouth 2 (two) times daily as needed.    [provider]  cyclobenzaprine  (FLEXERIL ) 10 MG tablet Take 1  tablet (10 mg total) by mouth 2 (two) times daily as needed for muscle spasms. 11/06/20   Geiple, Joshua, PA-C  cyclobenzaprine  (FLEXERIL ) 10 MG tablet Take 1 tablet (10 mg total) by mouth 2 (two) times daily as needed for muscle spasms. 04/13/21   Tegeler, Marine Sia, MD  dicyclomine  (BENTYL ) 10 MG capsule Take 10 mg by mouth 4 (four) times daily -  before meals and at bedtime.    [provider]  DOCOSAHEXAENOIC ACID PO Take 1 g by mouth.    [provider]  esomeprazole (NEXIUM) 40 MG capsule Take 40 mg by mouth daily before breakfast.    [provider]  famotidine  (PEPCID ) 20 MG tablet Take 1 tablet (20 mg total) by mouth 2 (two) times daily. 08/29/21   Wynetta Heckle, MD  famotidine  (PEPCID ) 40 MG tablet Take 40 mg by mouth daily.    [provider]  ferrous sulfate 325 (65 FE) MG tablet Take 325 mg by mouth 2 (two) times daily with a meal.    [provider]  fish oil-omega-3 fatty acids 1000 MG capsule Take 1 g by mouth 3 (three) times daily.    [provider]  Flaxseed, Linseed, (FLAXSEED OIL) 1000 MG CAPS Take 1 capsule by mouth 2 (two) times daily.    [provider]  fluticasone (FLONASE) 50 MCG/ACT nasal spray 1-2 SPRAY, NASAL, IN EACH NOSTRIL DAILY 04/17/15   [provider]  gabapentin (NEURONTIN) 100  MG capsule Take 100 mg by mouth 3 (three) times daily.    [provider]  Garlic 1000 MG CAPS Take 1 capsule by mouth daily.    [provider]  meloxicam  (MOBIC ) 7.5 MG tablet Take 1 tablet (7.5 mg total) by mouth daily. 11/06/20   Geiple, Joshua, PA-C  methocarbamol  (ROBAXIN ) 500 MG tablet Take 1 tablet (500 mg total) by mouth every 6 (six) hours as needed for muscle spasms. 08/29/21   Wynetta Heckle, MD  Multiple Vitamins-Minerals (MULTIVITAMIN WITH MINERALS) tablet Take 1 tablet by mouth daily.      [provider]  neomycin-polymyxin-gramicidin (NEOSPORIN) 1.75-10000-.025 ophthalmic  solution Place 1-2 drops into the right eye 4 (four) times daily.    [provider]  pantoprazole  (PROTONIX ) 40 MG tablet Take 40 mg by mouth 2 (two) times daily. 10/05/20   [provider]  RABEprazole (ACIPHEX) 20 MG tablet Take by mouth. 09/26/16   [provider]  sucralfate  (CARAFATE ) 1 g tablet Take 1 tablet (1 g total) by mouth 4 (four) times daily -  with meals and at bedtime. 08/29/21   Wynetta Heckle, MD  VITAMIN D PO Take by mouth.    [provider]  Vitamin D, Ergocalciferol, (DRISDOL) 50000 units CAPS capsule Take 50,000 Units by mouth every 7 (seven) days.    [provider]  omeprazole (PRILOSEC) 40 MG capsule Take 40 mg by mouth daily.    12/21/11  [provider]      Allergies    Adhesive [tape], Demerol [meperidine hcl], Meperidine hcl, Percocet [oxycodone-acetaminophen ], and Septra [bactrim]    Review of Systems   Review of Systems  Neurological:  Positive for headaches.  All other systems reviewed and are negative.   Physical Exam Updated Vital Signs BP (!) 167/106 (BP Location: Right Arm)   Pulse 91   Temp 98.2 F (36.8 C) (Oral)   Resp 20   Ht 5\' 1"  (1.549 m)   Wt 59.9 kg   SpO2 100%   BMI 24.94 kg/m  Physical Exam Vitals and nursing note reviewed.  Constitutional:      General: She is not in acute distress.    Appearance: Normal appearance. She is not ill-appearing or diaphoretic.  HENT:     Head: Normocephalic and atraumatic.     Mouth/Throat:     Mouth: Mucous membranes are moist.     Pharynx: Oropharynx is clear. No oropharyngeal exudate or posterior oropharyngeal erythema.  Eyes:     General: No scleral icterus.       Right eye: No discharge.        Left eye: No discharge.     Extraocular Movements: Extraocular movements intact.     Conjunctiva/sclera: Conjunctivae normal.     Pupils: Pupils are equal, round, and reactive to light.  Cardiovascular:     Rate and Rhythm: Normal rate and  regular rhythm.     Pulses: Normal pulses.     Heart sounds: Normal heart sounds. No murmur heard.    No friction rub. No gallop.  Pulmonary:     Effort: Pulmonary effort is normal. No respiratory distress.     Breath sounds: Normal breath sounds. No stridor. No wheezing, rhonchi or rales.  Abdominal:     General: Abdomen is flat. There is no distension.     Palpations: Abdomen is soft.     Tenderness: There is no abdominal tenderness. There is no right CVA tenderness, left CVA tenderness or guarding.  Musculoskeletal:  General: No deformity or signs of injury.     Cervical back: Normal range of motion and neck supple. No rigidity or tenderness.     Right lower leg: No edema.     Left lower leg: No edema.  Skin:    General: Skin is warm and dry.     Coloration: Skin is not pale.     Findings: No bruising or erythema.  Neurological:     General: No focal deficit present.     Mental Status: She is alert and oriented to person, place, and time. Mental status is at baseline.     Cranial Nerves: No cranial nerve deficit.     Sensory: No sensory deficit.     Motor: No weakness.     Coordination: Coordination normal.     Comments: No facial asymmetry, no ataxia, no apraxia, no aphasia, no arm drift, normal coordination with finger-to-nose, normal sensation to both upper and lower extremities bilaterally, normal grip strength bilaterally, normal strength to both flexion and extension to both upper lower extremities 5+ bilaterally, no visual field deficits, no nystagmus.   Psychiatric:        Mood and Affect: Mood normal.     ED Results / Procedures / Treatments   Labs (all labs ordered are listed, but only abnormal results are displayed) Labs Reviewed  BASIC METABOLIC PANEL WITH GFR - Abnormal; Notable for the following components:      Result Value   Glucose, Bld 122 (*)    All other components within normal limits  CBC WITH DIFFERENTIAL/PLATELET     EKG None  Radiology CT Head Wo Contrast Result Date: 03/06/2024 CLINICAL DATA:  Tingling involving the face and top of the head. EXAM: CT HEAD WITHOUT CONTRAST TECHNIQUE: Contiguous axial images were obtained from the base of the skull through the vertex without intravenous contrast. RADIATION DOSE REDUCTION: This exam was performed according to the departmental dose-optimization program which includes automated exposure control, adjustment of the mA and/or kV according to patient size and/or use of iterative reconstruction technique. COMPARISON:  Head CT 08/25/2023 FINDINGS: Brain: There is no evidence of an acute infarct, intracranial hemorrhage, mass, midline shift, or extra-axial fluid collection. Cerebral volume is normal. The ventricles are normal in size. Vascular: Calcified atherosclerosis at the skull base. No hyperdense vessel. Skull: No fracture or suspicious lesion. Sinuses/Orbits: Visualized paranasal sinuses and mastoid air cells are clear. Unremarkable orbits. Other: None. IMPRESSION: No evidence of acute intracranial abnormality. Electronically Signed   By: Aundra Lee M.D.   On: 03/06/2024 16:20    Procedures Procedures    Medications Ordered in ED Medications  ketorolac  (TORADOL ) 15 MG/ML injection 15 mg (15 mg Intramuscular Given 03/06/24 1621)    ED Course/ Medical Decision Making/ A&P                                 Medical Decision Making Amount and/or Complexity of Data Reviewed Radiology: ordered.   This patient is a 58 year old female who presents to the ED for concern of tingling to lips and scalp x 1 hour.  Has happened previously during similar times of anxiety.  On physical exam, patient is in no acute distress, afebrile, alert and orient x 4, speaking in full sentences, nontachypneic, nontachycardic.  No facial asymmetry, no ataxia, no apraxia, no aphasia, no arm drift, normal coordination with finger-to-nose, normal sensation to both upper and lower  extremities bilaterally, normal  grip strength bilaterally, normal strength to both flexion and extension to both upper lower extremities 5+ bilaterally, no visual field deficits, no nystagmus. LCTAB, no murmurs. Mild baseline abdominal tenderness to palpation, unchanged from previous according to patient.  Noted to have trapezius muscle tenderness to palpation bilaterally. Unremarkable physical exam otherwise.  Suspect tension headache versus CVA, with patient noting increased headache with tingling sensation, CT was ordered in triage.  Neuroexam was normal, low suspicion for CVA at this time.  With patient's reported anxiety increases with recent passing of and and tenderness of the trapezius muscles bilaterally, suspect most likely tension headache.  Patient was tried Toradol .  CT imaging unremarkable and labs are also unremarkable.  On reevaluation, patient headaches had improved and patient was no longer experiencing symptoms.  Suspect this is likely tension headache with anxiety from recent stressor.  Will have patient continue to follow with PCP for persistent symptoms and return to ED for any new or worsening symptoms.  Patient vital signs have remained stable throughout the course of patient's time in the ED. Low suspicion for any other emergent pathology at this time. I believe this patient is safe to be discharged. Provided strict return to ER precautions. Patient expressed agreement and understanding of plan. All questions were answered.  Differential diagnoses prior to evaluation: The emergent differential diagnosis includes, but is not limited to, tension headache, cluster headache, migraine headache, anxiety, CVA, IIH, press syndrome, TBI. This is not an exhaustive differential.   Past Medical History / Co-morbidities / Social History: Anxiety, tobacco abuse, HTN, GERD, lumbar area pain, degenerative disc disease, renal disorder, migraines, IBS, Barrett's esophagus,  fatigue,  Additional history: Chart reviewed. Pertinent results include:   Underwent Rhizotomy facet radiofrequency on 01/02/2024.  Lab Tests/Imaging studies: I personally interpreted labs/imaging and the pertinent results include:   CBC unremarkable BMP unremarkable CT unremarkable  I agree with the radiologist interpretation.   Medications: I ordered medication including Toradol .  I have reviewed the patients home medicines and have made adjustments as needed.  Critical Interventions:  Social Determinants of Health:  Disposition: After consideration of the diagnostic results and the patients response to treatment, I feel that the patient would benefit from discharge and treatment as above.   emergency department workup does not suggest an emergent condition requiring admission or immediate intervention beyond what has been performed at this time. The plan is: Follow-up with PCP for persistent symptoms, symptomatic management at home, return for new or worsening symptoms. The patient is safe for discharge and has been instructed to return immediately for worsening symptoms, change in symptoms or any other concerns.   Final Clinical Impression(s) / ED Diagnoses Final diagnoses:  Acute nonintractable headache, unspecified headache type    Rx / DC Orders ED Discharge Orders     None         Vevelyn Gowers 03/06/24 1730    Mordecai Applebaum, MD 03/13/24 [phone]

## 2024-03-06 NOTE — ED Triage Notes (Signed)
 Pt reports tingling sensation to entire face that started 30 minutes ago at work; reports she then developed a HA in the top of her head

## 2024-05-20 NOTE — Progress Notes (Addendum)
 Natasha Hickman 1966/08/27  NEW BREAST PATIENT   REASON FOR VISIT: Abnormal right  mammogram, radial scar  HISTORY: She had, in the absence of breast symptoms or findings, routine screening with callback and biopsy (under stereotactic guidance, a 9 gauge vacuum assisted device was used to perform core needle biopsy of distortion in the upper inner quadrant of the right breast using a superior to inferior approach. At the conclusion of the procedure, ribbon shaped tissue marker clip was deployed into the biopsy cavity).  I explained radial scar.  From UpToDate: Literature review current through: Aug 2023. This topic last updated: Dec 09, 2021. Radial scars -- Radial scars, also called complex sclerosing lesions, are a pathologic diagnosis, usually discovered incidentally when a breast mass or radiologic abnormality is removed or biopsied. Occasionally, radial scars are large enough to be detected on mammography as suspicious spiculated masses, which cannot be reliably differentiated from spiculated carcinomas by imaging alone [45-48]. Radial scars are characterized microscopically by a fibroelastic core with radiating ducts and lobules. In general, surgical excision is recommended when radial scars or complex sclerosing lesions are diagnosed on core biopsy, based on series showing that 8 to 17 percent of surgical specimens at subsequent excision are positive for malignancy [49-56]. These high upgrade rates have led to the suggestion that these lesions may actually be premalignant lesions, progressing from scar to hyperplasia to carcinoma [57]. This, however, is controversial, and more contemporary series suggest the presence of undiagnosed cancer is much lower, with upgrade rates in the range of 0.6 to 3.6 percent [58-60]. The upgrade rate is even lower with large-volume (eg, vacuum-assisted) needle biopsy. In a meta-analysis of over 3000 patients with radial scars, the upgrade rate after vacuum-assisted  biopsy to invasive and in situ cancer was only 0 and 1 percent, respectively [61]. In a study of 50 patients with radial scars who were followed by active surveillance, no patient progressed on interval imaging at 16 months [62].  The current recommendations from the American Society of Breast Surgeons state that most radial scars should be excised, although imaging follow-up is reasonable for small, image-detected radial scars that are completely removed or well-sampled with large-gauge devices and in the setting of imaging-pathology concordance [37].  No additional treatment beyond excision is needed for radial scars. The risk of subsequent breast cancer after excision in this population is small, and chemoprevention is not indicated.  To the contrary:  Follow-up excisional biopsy is warranted for RS/CSL specifically if they are larger than 1.0 cm with worrisome radiographic images or showed radiologic and pathologic discordance, as approximately 29% (11/38) of these cases will have an upgrade to in-situ or invasive carcinomas or other high risk lesions on follow-up.   Aziza Nassar, Amy L. Conners, Betul Celik, et al. Radial scar/Complex Sclerosing Lesions: A Clinicopathologic Correlation Study from a Single Institution [facility] ). Ann Diagn Pathol. 2015 Feb; 19(1): 24-28.    IMAGING REVIEW:  CLINICAL DATA:  Screening.   EXAM: DIGITAL SCREENING BILATERAL MAMMOGRAM WITH TOMOSYNTHESIS AND CAD   TECHNIQUE: Bilateral screening digital craniocaudal and mediolateral oblique mammograms were obtained. Bilateral screening digital breast tomosynthesis was performed. The images were evaluated with computer-aided detection.   COMPARISON:  Previous exam(s).   ACR Breast Density Category c: The breasts are heterogeneously dense, which may obscure small masses.   FINDINGS: In the right breast, possible distortion warrants further evaluation. In the left breast, no findings suspicious for  malignancy.   IMPRESSION: Further evaluation is suggested for possible  distortion in the right breast.   RECOMMENDATION: Diagnostic mammogram and possibly ultrasound of the right breast. (Code:[id number])   The patient will be contacted regarding the findings, and additional imaging will be scheduled.   BI-RADS CATEGORY  0: Incomplete: Need additional imaging evaluation.     Electronically Signed   By: Inocente Ast M.D.   On: 04/26/2024 14:53    PATHOLOGY REVIEW: 05/15/2024 BREAST, RIGHT, NEEDLE CORE BIOPSY:              Breast tissue with a radial scar, intraductal papilloma and usual ductal hyperplasia (UDH).              Negative for malignancy.    Allergies[1]  Current Outpatient Medications  Medication Instructions  . amlodipine-atorvastatin 5-20 mg tab 1 tablet, Daily  . ascorbic acid (VITAMIN C) 1,000 mg, Daily  . cholecalciferol (VITAMIN D3) 1,000 unit (25 mcg) tablet 1 tablet, Daily  . clonazePAM (KLONOPIN) 0.5 mg, Daily PRN  . dicyclomine  (BENTYL ) 10 mg, oral, 3 times daily  . DULoxetine (CYMBALTA) 30 mg capsule 1 capsule, Daily  . ergocalciferol (VITAMIN D2) 50,000 Units, oral, Weekly  . famotidine  (PEPCID ) 40 mg, oral, 2 times daily  . methylPREDNISolone (MEDROL DOSEPAK) 4 mg 6 day dose pack Take As Directed On Package  . multivitamin (THERAGRAN) tab tablet 1 tablet, Daily  . nortriptyline (PAMELOR) 10 mg capsule     Patient Active Problem List   Diagnosis Date Noted  . Lumbar spondylosis 10/16/2023  . Abnormality of gait and mobility 02/08/2023  . Arthropathy of facet joint 01/04/2023  . Avascular necrosis of hip, right (HCC) 09/27/2022  . Abdominal wall pain 05/05/2022  . Chronic idiopathic constipation 02/24/2022  . Epigastric pain 09/01/2021  . Chronic bilateral back pain 09/01/2021  . Achalasia 09/01/2021  . Anemia 09/01/2021  . History of adenomatous polyp of colon 09/01/2021  . Irritable bowel syndrome with constipation   . Avascular  necrosis of bone of right hip (HCC) 03/17/2021  . Common migraine   . Arthritis of left acromioclavicular joint   . Tendinopathy of left rotator cuff   . Arthritis of left glenohumeral joint   . Primary osteoarthritis involving multiple joints   . Chondromalacia of right patella 01/06/2019  . Chondromalacia of left patella 01/06/2019  . Chronic right-sided low back pain with right-sided sciatica 11/21/2018  . Lumbar radiculopathy 11/21/2018  . Hand numbness 11/21/2018  . Ingrown nail 08/08/2014  . Pain in lower limb 08/08/2014  . Headache 01/26/2009  . Gastroesophageal reflux disease with esophagitis without hemorrhage 01/09/2009  . Esophageal reflux 01/09/2009  . Seasonal allergic rhinitis due to pollen 01/06/2009  . Allergic rhinitis 01/06/2009  . Diarrhea 01/06/2009  . Essential hypertension 11/18/2008  . Generalized anxiety disorder 11/18/2008  . Anxiety state 11/18/2008  . Hypertension 11/18/2008  . Dyspnea 11/18/2008  . Tobacco abuse 11/18/2008      PMH SURG: A gastric fundoplication by Dr. Isac in 1990, her gastroenterologist was Dr. Charmaine. She had a hysterectomy for heavy bleeding in 1999, retained ovaries and no ERT. She had a right hip by Dr. Lewanda, 2 years ago and quite satisfied with her recovery.  PMH MED: Hypertension, admittedly her blood pressure is not that well-controlled and she will discuss with Dr. Claudene, the next time she is in the office. GERD.  GYN: G2, FT at age 34.  Menarche at age 66 (6 into seventh grade).  Partial hysterectomy for heavy bleeding in 1999, no ERT.  SH: She quit smoking  a few days before Thanksgiving, 3 years ago due to headaches and she stopped on her own and I gave her strong congratulations, her husband is very proud that she was able to do so.  She is married.  She works at [employer] [place].  FH: She is unaware of any with common cancers including breast, gynecologic, colorectal or prostate cancer known.  ROS: A complete  Review of Systems was performed, discussed and presented in HPI or otherwise negative.   CONTRIBUTING HISTORY: Her blood pressure was high today, she thinks it has been high at home also and I encourage to have this under better control that she will seek evaluation with Dr. Claudene.  PHYSICAL EXAMINATION: BP (!) 182/113   Pulse 107   Temp 98 F (36.7 C) (Temporal)   Resp 17   Ht 1.575 m (5' 2)   Wt 60.8 kg (134 lb)   LMP  (LMP Unknown) Comment: Partial hysterectomy, have both ovaries  SpO2 96%   BMI 24.51 kg/m   GEN she is groomed, conversant and in no physical distress. Her husband remains.  NECK no thyroid nodule, no paratracheal adenopathy. No visible JVD. LYMPH no jugular, supra or infra clavicular adenopathy. No axillary adenopathy on either side.   LUNG clear bases. COR regular but fast.  BREAST sitting contour symmetrical, smaller but appropriate for slender frame with modest ptosis. No visible skin changes but a faint puncture site right superior. In the supine position with the arm elevated, the right breast is soft with no focal nodule or mass in any quadrant or the subareolar region. The left breast is similar in texture with no focal nodule or mass in any quadrant or the subareolar region. Protruding nipple and areolar skin normal bilaterally. ABD when supine, minimally protuberant but not distended with a long subxiphoid to umbilical scar, without hernia.  I do not palpate liver edge.  IMPRESSION: 1.  Nonpalpable right mammographic abnormality, biopsy as a complex sclerosing lesion.  She had vacuum core biopsy for this, the yield for cancer is low.  Other times it is something like atypical ductal hyperplasia and I explained that to them.  The ultimate recommendation is to excise a radial scar in a healthy patient otherwise and if benign, should be able to resume screening.   BREAST SURGERY COUNSELING: I explained the technical details of lumpectomy. I described the  intraoperative conduct and desire to obtain generous margins around the area of interest. Incomplete excision, however, can occur occasionally. The goal is ample tissue removal for the pathologist, but with no or minimal resulting breast contour distortion or decrease is size. I often place a small metal clip(s) within the cavity for precise subsequent localization. Local bruising or bleeding with hematoma, or wound infection, is low.   When palpable, an incision is made in the breast directed to the lump. When an abnormality is not palpable, we use a localization procedure for accuracy.  There is more experience with placement of a flexible wire the morning of operation which allows us  to complete placement and removal on this same day.  The alternative is needle introduction of a Savi-brand radio-emitting (infrared-activated) electromagnetic wave reflector placed any time the days or weeks preoperatively. The latter is our preferred method of late. In either setting, with a localization apparatus in place, the surgical conduct is to remove the cancer by any localization method as an open incision to remove tissue; under different circumstances of tissue and patient characteristics, we can achieve a  negative margin of excision ~ 85-90% of patients (incomplete margins occur in 15% of those with lumpectomy for duct carcinoma in situ [DCIS] or for invasive lobular cancer or for invasive ductal cancer but with extensive intraductal component; incomplete margins occur in < 10% with invasive ductal carcinoma).  For those with an incomplete margin, a second procedure is required. For those without a needle biopsy revealing cancer but with recommended lumpectomy to avoid under-diagnosis, and incomplete margin of excision is inconsequential if benign.     This surgery is performed in the outpatient setting. I advise a woman to wear a compressive bra night and day for the 5 days beginning after biopsy. Any dressing can  be removed the subsequent morning from operation, with shower allowed directly over the incision and replace the bra.   She is alert and oriented and in no distress and is cleared for surgery in the Ambulatory Setting.   We spent time to discuss the indications for the operation selected, and the benefits of this over the alternatives discussed. We discussed the technical details and intraoperative conduct of this type of operation. We discussed the risks and consequences of an operation of this type. We wish to proceed.   PLAN: 1.  Request for right  breast Savi reflector placement followed by an outpatient right  SAVI-directed lumpectomy.     This document was created using the aid of voice recognition Scientist, clinical (histocompatibility and immunogenetics).   Mark A. Arredondo, MD, FACS PRACTICE LIMITED TO SURGICAL ONCOLOGY [facility]  ADDENDUM: Correction for grammar and details added and  for clarity and accurateness. Mark A. Arredondo, MD, FACS 06/06/2024 10:42 AM          [1] Allergies Allergen Reactions  . Meperidine Hives  . Oxycodone-Acetaminophen  Hives and GI Intolerance  . Septra GI Intolerance  . Sulfamethoxazole-Trimethoprim Hives  . Surgical Tape Other (See Comments)    Burns my skin

## 2024-05-26 ENCOUNTER — Other Ambulatory Visit: Payer: Self-pay

## 2024-05-26 ENCOUNTER — Emergency Department (HOSPITAL_BASED_OUTPATIENT_CLINIC_OR_DEPARTMENT_OTHER)
Admission: EM | Admit: 2024-05-26 | Discharge: 2024-05-26 | Disposition: A | Discharge status: Home / Self Care | Attending: Emergency Medicine | Admitting: Emergency Medicine

## 2024-05-26 ENCOUNTER — Emergency Department (HOSPITAL_BASED_OUTPATIENT_CLINIC_OR_DEPARTMENT_OTHER)

## 2024-05-26 ENCOUNTER — Encounter (HOSPITAL_BASED_OUTPATIENT_CLINIC_OR_DEPARTMENT_OTHER): Payer: Self-pay

## 2024-05-26 DIAGNOSIS — M25511 Pain in right shoulder: Secondary | ICD-10-CM | POA: Diagnosis present

## 2024-05-26 DIAGNOSIS — M62838 Other muscle spasm: Secondary | ICD-10-CM | POA: Insufficient documentation

## 2024-05-26 DIAGNOSIS — M62830 Muscle spasm of back: Secondary | ICD-10-CM

## 2024-05-26 MED ORDER — NAPROXEN 250 MG PO TABS
500.0000 mg | ORAL_TABLET | Freq: Once | ORAL | Status: AC
  Administered 2024-05-26: 500 mg via ORAL
  Filled 2024-05-26: qty 2

## 2024-05-26 MED ORDER — METHOCARBAMOL 500 MG PO TABS: 500.0000 mg | ORAL_TABLET | Freq: Two times a day (BID) | ORAL | 0 refills | Status: DC

## 2024-05-26 MED ORDER — LIDOCAINE 5 % EX PTCH
1.0000 | MEDICATED_PATCH | CUTANEOUS | Status: DC
  Administered 2024-05-26: 1 via TRANSDERMAL
  Filled 2024-05-26: qty 1

## 2024-05-26 MED ORDER — CELECOXIB 200 MG PO CAPS: 200.0000 mg | ORAL_CAPSULE | Freq: Two times a day (BID) | ORAL | 0 refills | Status: AC

## 2024-05-26 MED ORDER — CELECOXIB 200 MG PO CAPS
200.0000 mg | ORAL_CAPSULE | Freq: Two times a day (BID) | ORAL | 0 refills | Status: DC
Start: 2024-05-26 — End: 2024-05-26

## 2024-05-26 MED ORDER — METHOCARBAMOL 500 MG PO TABS: 500.0000 mg | ORAL_TABLET | Freq: Two times a day (BID) | ORAL | 0 refills | Status: AC

## 2024-05-26 NOTE — ED Triage Notes (Signed)
 Reports R shoulder pain for 1 month. No known injury. No obvious swelling noted. States she is unable to raise arm   Denies  chest pain or [facility]

## 2024-05-26 NOTE — Discharge Instructions (Signed)
 Take Celebrex  twice daily as prescribed with food. Take Robaxin  as needed as prescribed, this can make you drowsy, do not drive will take this medication.  Consider taking this medication only at bedtime.  You can apply a warm compress to the area for 20 minutes at a time and follow with gentle stretching as discussed.  Recommend following up with your primary care provider for recheck and possible referral to physical therapy.  Consider follow-up with orthopedics.

## 2024-05-26 NOTE — ED Provider Notes (Signed)
 Natasha Hickman EMERGENCY DEPARTMENT AT [facility] Provider Note   CSN: [id number] Arrival date & time: 05/26/24  1209     Patient presents with: Shoulder Pain   Natasha Hickman is a 58 y.o. female.   58 year old female presents with complaint of pain in her right shoulder.  She states that this started about a month ago feeling like she had a crick in her neck or her shoulder.  She went to urgent care and was prescribed Flexeril  which she felt made her just too sleepy.  She followed up with a provider in her PCP office who gave her a Toradol  shot in her shoulder.  Patient thought that she would benefit better from a cortisone shot.  She reports the pain to be more in her trapezius area which is worse with movement of her shoulder and radiates towards her deltoid area.  She denies any injuries to the shoulders.  She does work sitting and typing which is difficult in her current state.       Prior to Admission medications   Medication Sig Start Date End Date Taking? Authorizing Provider  celecoxib  (CELEBREX ) 200 MG capsule Take 1 capsule (200 mg total) by mouth 2 (two) times daily. 05/26/24 06/25/24 Yes Beverley Leita LABOR, PA-C  methocarbamol  (ROBAXIN ) 500 MG tablet Take 1 tablet (500 mg total) by mouth 2 (two) times daily. 05/26/24  Yes Beverley Leita LABOR, PA-C  ALPRAZolam (XANAX) 0.25 MG tablet Take 0.125 mg by mouth once as needed. For anxiety    [provider]  amLODipine-atorvastatin (CADUET) 5-20 MG tablet Take by mouth. 11/06/20   [provider]  amLODipine-olmesartan (AZOR) 5-20 MG tablet Take 1 tablet by mouth daily.      [provider]  Ascorbic Acid (VITAMIN C PO) Take by mouth.    [provider]  atenolol (TENORMIN) 25 MG tablet Take 25 mg by mouth daily. 10/14/20   [provider]  BLACK CURRANT SEED OIL PO Take by mouth.    [provider]  clonazePAM (KLONOPIN) 0.5 MG tablet Take 0.5 mg by mouth 2 (two) times daily as  needed.    [provider]  dicyclomine  (BENTYL ) 10 MG capsule Take 10 mg by mouth 4 (four) times daily -  before meals and at bedtime.    [provider]  DOCOSAHEXAENOIC ACID PO Take 1 g by mouth.    [provider]  esomeprazole (NEXIUM) 40 MG capsule Take 40 mg by mouth daily before breakfast.    [provider]  famotidine  (PEPCID ) 40 MG tablet Take 40 mg by mouth daily.    [provider]  ferrous sulfate 325 (65 FE) MG tablet Take 325 mg by mouth 2 (two) times daily with a meal.    [provider]  fish oil-omega-3 fatty acids 1000 MG capsule Take 1 g by mouth 3 (three) times daily.    [provider]  Flaxseed, Linseed, (FLAXSEED OIL) 1000 MG CAPS Take 1 capsule by mouth 2 (two) times daily.    [provider]  fluticasone (FLONASE) 50 MCG/ACT nasal spray 1-2 SPRAY, NASAL, IN EACH NOSTRIL DAILY 04/17/15   [provider]  gabapentin (NEURONTIN) 100 MG capsule Take 100 mg by mouth 3 (three) times daily.    [provider]  Garlic 1000 MG CAPS Take 1 capsule by mouth daily.    [provider]  Multiple Vitamins-Minerals (MULTIVITAMIN WITH MINERALS) tablet Take 1 tablet by mouth daily.  [provider]  neomycin-polymyxin-gramicidin (NEOSPORIN) 1.75-10000-.025 ophthalmic solution Place 1-2 drops into the right eye 4 (four) times daily.    [provider]  pantoprazole  (PROTONIX ) 40 MG tablet Take 40 mg by mouth 2 (two) times daily. 10/05/20   [provider]  RABEprazole (ACIPHEX) 20 MG tablet Take by mouth. 09/26/16   [provider]  VITAMIN D PO Take by mouth.    [provider]  Vitamin D, Ergocalciferol, (DRISDOL) 50000 units CAPS capsule Take 50,000 Units by mouth every 7 (seven) days.    [provider]  omeprazole (PRILOSEC) 40 MG capsule Take 40 mg by mouth daily.    12/21/11  [provider]    Allergies: Adhesive [tape],  Demerol [meperidine hcl], Meperidine hcl, Percocet [oxycodone-acetaminophen ], and Septra [bactrim]    Review of Systems Negative except as per HPI Updated Vital Signs BP (!) 179/106   Pulse 85   Temp 98.7 F (37.1 C) (Oral)   Resp 18   Wt 59.9 kg   SpO2 100%   BMI 24.94 kg/m   Physical Exam Vitals and nursing note reviewed.  Constitutional:      General: She is not in acute distress.    Appearance: She is well-developed. She is not diaphoretic.  HENT:     Head: Normocephalic and atraumatic.  Cardiovascular:     Pulses: Normal pulses.  Pulmonary:     Effort: Pulmonary effort is normal.  Musculoskeletal:        General: Tenderness present. No swelling, deformity or signs of injury.     Right shoulder: No swelling, deformity, effusion, tenderness, bony tenderness or crepitus. Normal strength. Normal pulse.       Arms:     Comments: Reasonable range of motion although pain is worse to the right trapezius area with overhead movement of her right arm.  50  Skin:    General: Skin is warm and dry.     Findings: No erythema or rash.  Neurological:     Mental Status: She is alert and oriented to person, place, and time.     Sensory: No sensory deficit.     Motor: No weakness.  Psychiatric:        Behavior: Behavior normal.     (all labs ordered are listed, but only abnormal results are displayed) Labs Reviewed - No data to display  EKG: None  Radiology: DG Shoulder Right Result Date: 05/26/2024 CLINICAL DATA:  shoulder pain EXAM: RIGHT SHOULDER - 2+ VIEW COMPARISON:  None Available. FINDINGS: No acute fracture or dislocation. There is no evidence of arthropathy or other focal bone abnormality. Soft tissues are unremarkable. IMPRESSION: No acute fracture or dislocation. Electronically Signed   By: Rogelia Myers M.D.   On: 05/26/2024 12:59     Procedures   Medications Ordered in the ED  lidocaine  (LIDODERM ) 5 % 1 patch (has no administration in time range)  naproxen   (NAPROSYN ) tablet 500 mg (has no administration in time range)                                    Medical Decision Making Amount and/or Complexity of Data Reviewed Radiology: ordered.   58 year old female presents complaint as above.  She is found to have tenderness with spasm across the right upper trapezius area which is worse with palpation as well as overhead extension of the right arm.  X-ray of the right shoulder  as ordered in triage interpreted by self is negative for acute bony abnormality.  Agree with radiology interpretation.  She does not have any midline neck tenderness.  Radial pulses are equal, strength symmetric.  Discussed with patient cortisone injection in the right shoulder is not a treatment option in the emergency room however I feel that she would benefit from some physical therapy and urged to follow-up with her PCP for this for referral.  She is provided with information for orthopedics as well as prescription for Celebrex  and Robaxin .  Provided with Lidoderm  patch in the ER, discussed gentle range of motion activities to work on while she awaits follow-up with PCP.     Final diagnoses:  Spasm of right trapezius muscle    ED Discharge Orders          Ordered    methocarbamol  (ROBAXIN ) 500 MG tablet  2 times daily        05/26/24 1312    celecoxib  (CELEBREX ) 200 MG capsule  2 times daily        05/26/24 1312               Beverley Leita DELENA DEVONNA 05/26/24 1319    Lenor Hollering, MD 05/26/24 1510

## 2024-08-06 ENCOUNTER — Emergency Department (HOSPITAL_BASED_OUTPATIENT_CLINIC_OR_DEPARTMENT_OTHER)
Admission: EM | Admit: 2024-08-06 | Discharge: 2024-08-06 | Disposition: A | Discharge status: Home / Self Care | Attending: Emergency Medicine | Admitting: Emergency Medicine

## 2024-08-06 ENCOUNTER — Other Ambulatory Visit: Payer: Self-pay

## 2024-08-06 ENCOUNTER — Emergency Department (HOSPITAL_BASED_OUTPATIENT_CLINIC_OR_DEPARTMENT_OTHER)

## 2024-08-06 ENCOUNTER — Other Ambulatory Visit (HOSPITAL_BASED_OUTPATIENT_CLINIC_OR_DEPARTMENT_OTHER): Payer: Self-pay

## 2024-08-06 ENCOUNTER — Encounter (HOSPITAL_BASED_OUTPATIENT_CLINIC_OR_DEPARTMENT_OTHER): Payer: Self-pay

## 2024-08-06 DIAGNOSIS — Z79899 Other long term (current) drug therapy: Secondary | ICD-10-CM | POA: Diagnosis not present

## 2024-08-06 DIAGNOSIS — I10 Essential (primary) hypertension: Secondary | ICD-10-CM | POA: Diagnosis not present

## 2024-08-06 DIAGNOSIS — R519 Headache, unspecified: Secondary | ICD-10-CM | POA: Insufficient documentation

## 2024-08-06 LAB — CBC WITH DIFFERENTIAL/PLATELET
Abs Immature Granulocytes: 0.04 K/uL (ref 0.00–0.07)
Basophils Absolute: 0 K/uL (ref 0.0–0.1)
Basophils Relative: 0 %
Eosinophils Absolute: 0 K/uL (ref 0.0–0.5)
Eosinophils Relative: 1 %
HCT: 35.3 % — ABNORMAL LOW (ref 36.0–46.0)
Hemoglobin: 11.6 g/dL — ABNORMAL LOW (ref 12.0–15.0)
Immature Granulocytes: 1 %
Lymphocytes Relative: 46 %
Lymphs Abs: 2.8 K/uL (ref 0.7–4.0)
MCH: 30.5 pg (ref 26.0–34.0)
MCHC: 32.9 g/dL (ref 30.0–36.0)
MCV: 92.9 fL (ref 80.0–100.0)
Monocytes Absolute: 0.3 K/uL (ref 0.1–1.0)
Monocytes Relative: 5 %
Neutro Abs: 2.9 K/uL (ref 1.7–7.7)
Neutrophils Relative %: 47 %
Platelets: 196 K/uL (ref 150–400)
RBC: 3.8 MIL/uL — ABNORMAL LOW (ref 3.87–5.11)
RDW: 13.6 % (ref 11.5–15.5)
WBC: 6.1 K/uL (ref 4.0–10.5)
nRBC: 0 % (ref 0.0–0.2)

## 2024-08-06 LAB — COMPREHENSIVE METABOLIC PANEL WITH GFR
ALT: 35 U/L (ref 0–44)
AST: 27 U/L (ref 15–41)
Albumin: 4.2 g/dL (ref 3.5–5.0)
Alkaline Phosphatase: 44 U/L (ref 38–126)
Anion gap: 10 (ref 5–15)
BUN: 21 mg/dL — ABNORMAL HIGH (ref 6–20)
CO2: 26 mmol/L (ref 22–32)
Calcium: 9.4 mg/dL (ref 8.9–10.3)
Chloride: 106 mmol/L (ref 98–111)
Creatinine, Ser: 0.75 mg/dL (ref 0.44–1.00)
GFR, Estimated: >60 mL/min (ref >60)
Glucose, Bld: 115 mg/dL — ABNORMAL HIGH (ref 70–99)
Potassium: 3.9 mmol/L (ref 3.5–5.1)
Sodium: 143 mmol/L (ref 135–145)
Total Bilirubin: 0.2 mg/dL (ref 0.0–1.2)
Total Protein: 6.5 g/dL (ref 6.5–8.1)

## 2024-08-06 MED ORDER — METOCLOPRAMIDE HCL 10 MG PO TABS
10.0000 mg | ORAL_TABLET | Freq: Four times a day (QID) | ORAL | 0 refills | Status: AC | PRN
  Filled 2024-08-06: qty 10, 3d supply, fill #0

## 2024-08-06 MED ORDER — DIPHENHYDRAMINE HCL 50 MG/ML IJ SOLN
12.5000 mg | Freq: Once | INTRAMUSCULAR | Status: AC
  Administered 2024-08-06: 12.5 mg via INTRAVENOUS
  Filled 2024-08-06: qty 1

## 2024-08-06 MED ORDER — METOCLOPRAMIDE HCL 5 MG/ML IJ SOLN
10.0000 mg | Freq: Once | INTRAMUSCULAR | Status: AC
  Administered 2024-08-06: 10 mg via INTRAVENOUS
  Filled 2024-08-06: qty 2

## 2024-08-06 NOTE — ED Notes (Signed)
Patient transported to CT and back without event.

## 2024-08-06 NOTE — Discharge Instructions (Addendum)
 You were seen for your headache in the emergency department.  You were given medicines which improved your symptoms.  At home, please take Tylenol  and ibuprofen  for your headache.  You may also take the Reglan  we have prescribed you for your headache or any nausea or vomiting that you have.  You may take this with Benadryl  for severe headaches but please note that this will make you drowsy.    Ensure that you may also have some sinus congestion.  Please use sinus rinses and over-the-counter antihistamines like Zyrtec and Benadryl  for the congestion  Check your MyChart online for the results of any tests that had not resulted by the time you left the emergency department.   Follow-up with your primary doctor in 2-3 days regarding your visit.    Return immediately to the emergency department if you experience any of the following: Vision changes, numbness or weakness of your arms or legs, or any other concerning symptoms.    Thank you for visiting our Emergency Department. It was a pleasure taking care of you today.

## 2024-08-06 NOTE — ED Triage Notes (Signed)
 Pt reports HA since last Tuesday. Initially thought it was sinus. Returned Sunday and given 2 shots which did not relieve. HA continues forehead,around eyes and top of head. Mild cough . Reports he is also constipated

## 2024-08-06 NOTE — ED Provider Notes (Signed)
 [facility] AT [facility] Provider Note   CSN: [id number] Arrival date & time: 08/06/24  9253     Patient presents with: Headache   Natasha Hickman is a 58 y.o. female.  {Add pertinent medical, surgical, social history, OB history to HPI:1326} 58 year old female history of hypertension who presents with a headache.  Patient reports for the past 2 to 3 weeks she has been having a headache.  Says its constant.  7/10 in severity.  Pressure and in the front of her head.  Photo but no phonophobia.  No fevers.  Says she has been congested.  Went to urgent care a week ago and was given amoxicillin  and prednisone for suspected sinus infection.  Says that it did not significantly change her symptoms.  Went back again on Sunday and received 2 shots but is not sure exactly what she received.       Prior to Admission medications   Medication Sig Start Date End Date Taking? Authorizing Provider  ALPRAZolam (XANAX) 0.25 MG tablet Take 0.125 mg by mouth once as needed. For anxiety    [provider]  amLODipine-atorvastatin (CADUET) 5-20 MG tablet Take by mouth. 11/06/20   [provider]  amLODipine-olmesartan (AZOR) 5-20 MG tablet Take 1 tablet by mouth daily.      [provider]  Ascorbic Acid (VITAMIN C PO) Take by mouth.    [provider]  atenolol (TENORMIN) 25 MG tablet Take 25 mg by mouth daily. 10/14/20   [provider]  BLACK CURRANT SEED OIL PO Take by mouth.    [provider]  clonazePAM (KLONOPIN) 0.5 MG tablet Take 0.5 mg by mouth 2 (two) times daily as needed.    [provider]  dicyclomine  (BENTYL ) 10 MG capsule Take 10 mg by mouth 4 (four) times daily -  before meals and at bedtime.    [provider]  DOCOSAHEXAENOIC ACID PO Take 1 g by mouth.    [provider]  esomeprazole (NEXIUM) 40 MG capsule Take 40 mg by mouth daily before breakfast.    [provider]  famotidine  (PEPCID ) 40 MG tablet Take 40 mg by mouth daily.    [provider]  ferrous sulfate 325 (65 FE) MG tablet Take 325 mg by mouth 2 (two) times daily with a meal.    [provider]  fish oil-omega-3 fatty acids 1000 MG capsule Take 1 g by mouth 3 (three) times daily.    [provider]  Flaxseed, Linseed, (FLAXSEED OIL) 1000 MG CAPS Take 1 capsule by mouth 2 (two) times daily.    [provider]  fluticasone (FLONASE) 50 MCG/ACT nasal spray 1-2 SPRAY, NASAL, IN EACH NOSTRIL DAILY 04/17/15   [provider]  gabapentin (NEURONTIN) 100 MG capsule Take 100 mg by mouth 3 (three) times daily.    [provider]  Garlic 1000 MG CAPS Take 1 capsule by mouth daily.    [provider]  methocarbamol  (ROBAXIN ) 500 MG tablet Take 1 tablet (500 mg total) by mouth 2 (two) times daily. 05/26/24   Beverley Leita LABOR, PA-C  Multiple Vitamins-Minerals (MULTIVITAMIN WITH MINERALS) tablet Take 1 tablet by mouth daily.      [provider]  neomycin-polymyxin-gramicidin (NEOSPORIN) 1.75-10000-.025 ophthalmic solution Place 1-2 drops into the right eye 4 (four) times daily.    [provider]  pantoprazole  (PROTONIX ) 40 MG tablet Take 40 mg by mouth 2 (two) times daily. 10/05/20  [provider]  RABEprazole (ACIPHEX) 20 MG tablet Take by mouth. 09/26/16   [provider]  VITAMIN D PO Take by mouth.    [provider]  Vitamin D, Ergocalciferol, (DRISDOL) 50000 units CAPS capsule Take 50,000 Units by mouth every 7 (seven) days.    [provider]  omeprazole (PRILOSEC) 40 MG capsule Take 40 mg by mouth daily.    12/21/11  [provider]    Allergies: Adhesive [tape], Demerol [meperidine hcl], Meperidine hcl, Percocet [oxycodone-acetaminophen ], and Septra [bactrim]    Review of Systems  Updated Vital Signs BP (!) 165/105   Pulse 75   Temp 98.7 F (37.1 C) (Oral)   Resp 18   Wt  57.6 kg   SpO2 100%   BMI 24.00 kg/m   Physical Exam Vitals and nursing note reviewed.  Constitutional:      General: She is not in acute distress.    Appearance: She is well-developed.  HENT:     Head: Normocephalic and atraumatic.     Right Ear: External ear normal.     Left Ear: External ear normal.     Nose: Nose normal.     Mouth/Throat:     Comments: Frontal sinus tenderness to palpation Eyes:     Extraocular Movements: Extraocular movements intact.     Conjunctiva/sclera: Conjunctivae normal.     Pupils: Pupils are equal, round, and reactive to light.     Comments: Pupils 4 mm bilaterally  Neck:     Comments: No meningismus or neck rigidity Musculoskeletal:     Cervical back: Normal range of motion and neck supple.     Right lower leg: No edema.     Left lower leg: No edema.  Skin:    General: Skin is warm and dry.  Neurological:     Mental Status: She is alert.     Comments: MENTAL STATUS: AAOx3 CRANIAL NERVES: II: Pupils equal and reactive 4 mm BL, no RAPD, no VF deficits III, IV, VI: EOM intact, no gaze preference or deviation, no nystagmus. V: normal sensation to light touch in V1, V2, and V3 segments bilaterally VII: no facial weakness or asymmetry, no nasolabial fold flattening VIII: normal hearing to speech and finger friction IX, X: normal palatal elevation, no uvular deviation XI: 5/5 head turn and 5/5 shoulder shrug bilaterally XII: midline tongue protrusion MOTOR: 5/5 strength in R shoulder flexion, elbow flexion and extension, and grip strength. 5/5 strength in L shoulder flexion, elbow flexion and extension, and grip strength.  5/5 strength in R hip and knee flexion, knee extension, ankle plantar and dorsiflexion. 5/5 strength in L hip and knee flexion, knee extension, ankle plantar and dorsiflexion. SENSORY: Normal sensation to light touch in all extremities COORD: Normal finger to nose and heel to shin, no tremor, no dysmetria  Psychiatric:         Mood and Affect: Mood normal.     (all labs ordered are listed, but only abnormal results are displayed) Labs Reviewed - No data to display  EKG: None  Radiology: No results found.  {Document cardiac monitor, telemetry assessment procedure when appropriate:32947} Procedures   Medications Ordered in the ED - No data to display    {Click here for ABCD2, HEART and other calculators REFRESH Note before signing:1}                              Medical Decision Making Amount  and/or Complexity of Data Reviewed Labs: ordered. Radiology: ordered.  Risk Prescription drug management.   ***  {Document critical care time when appropriate  Document review of labs and clinical decision tools ie CHADS2VASC2, etc  Document your independent review of radiology images and any outside records  Document your discussion with family members, caretakers and with consultants  Document social determinants of health affecting pt's care  Document your decision making why or why not admission, treatments were needed:32947:::1}   Final diagnoses:  None    ED Discharge Orders     None
